# Patient Record
Sex: Female | Born: 1990 | Race: White | Hispanic: No | Marital: Married | Smoking: Never smoker
Health system: Southern US, Community
[De-identification: ages and names within clinical notes are randomized; demographics above are authoritative.]

## PROBLEM LIST (undated history)

## (undated) ENCOUNTER — Inpatient Hospital Stay (HOSPITAL_COMMUNITY): Payer: Self-pay

## (undated) DIAGNOSIS — F909 Attention-deficit hyperactivity disorder, unspecified type: Secondary | ICD-10-CM

## (undated) DIAGNOSIS — N83209 Unspecified ovarian cyst, unspecified side: Secondary | ICD-10-CM

## (undated) DIAGNOSIS — T8859XA Other complications of anesthesia, initial encounter: Secondary | ICD-10-CM

## (undated) DIAGNOSIS — R0600 Dyspnea, unspecified: Secondary | ICD-10-CM

## (undated) DIAGNOSIS — I493 Ventricular premature depolarization: Secondary | ICD-10-CM

## (undated) DIAGNOSIS — I10 Essential (primary) hypertension: Secondary | ICD-10-CM

## (undated) DIAGNOSIS — A749 Chlamydial infection, unspecified: Secondary | ICD-10-CM

## (undated) DIAGNOSIS — N879 Dysplasia of cervix uteri, unspecified: Secondary | ICD-10-CM

## (undated) DIAGNOSIS — F4310 Post-traumatic stress disorder, unspecified: Secondary | ICD-10-CM

## (undated) DIAGNOSIS — F429 Obsessive-compulsive disorder, unspecified: Secondary | ICD-10-CM

## (undated) DIAGNOSIS — G43909 Migraine, unspecified, not intractable, without status migrainosus: Secondary | ICD-10-CM

## (undated) DIAGNOSIS — O1490 Unspecified pre-eclampsia, unspecified trimester: Secondary | ICD-10-CM

## (undated) DIAGNOSIS — N390 Urinary tract infection, site not specified: Secondary | ICD-10-CM

## (undated) DIAGNOSIS — R000 Tachycardia, unspecified: Secondary | ICD-10-CM

## (undated) DIAGNOSIS — R87629 Unspecified abnormal cytological findings in specimens from vagina: Secondary | ICD-10-CM

## (undated) DIAGNOSIS — E162 Hypoglycemia, unspecified: Secondary | ICD-10-CM

## (undated) HISTORY — PX: WISDOM TOOTH EXTRACTION: SHX21

---
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
## 2014-02-26 ENCOUNTER — Ambulatory Visit (INDEPENDENT_AMBULATORY_CARE_PROVIDER_SITE_OTHER): Payer: BC Managed Care – PPO | Admitting: Family Medicine

## 2014-02-26 ENCOUNTER — Ambulatory Visit (INDEPENDENT_AMBULATORY_CARE_PROVIDER_SITE_OTHER): Payer: BC Managed Care – PPO

## 2014-02-26 VITALS — BP 122/78 | HR 94 | Temp 98.3°F | Resp 16 | Ht 62.3 in | Wt 149.8 lb

## 2014-02-26 DIAGNOSIS — R319 Hematuria, unspecified: Secondary | ICD-10-CM

## 2014-02-26 DIAGNOSIS — R109 Unspecified abdominal pain: Secondary | ICD-10-CM

## 2014-02-26 DIAGNOSIS — N926 Irregular menstruation, unspecified: Secondary | ICD-10-CM

## 2014-02-26 LAB — POCT UA - MICROSCOPIC ONLY
BACTERIA, U MICROSCOPIC: NEGATIVE
Casts, Ur, LPF, POC: NEGATIVE
Crystals, Ur, HPF, POC: NEGATIVE
Mucus, UA: NEGATIVE
Yeast, UA: NEGATIVE

## 2014-02-26 LAB — POCT URINALYSIS DIPSTICK
BILIRUBIN UA: NEGATIVE
GLUCOSE UA: NEGATIVE
Ketones, UA: NEGATIVE
LEUKOCYTES UA: NEGATIVE
NITRITE UA: NEGATIVE
Protein, UA: NEGATIVE
Spec Grav, UA: 1.02
Urobilinogen, UA: 0.2
pH, UA: 7

## 2014-02-26 LAB — POCT CBC
Granulocyte percent: 61.5 %G (ref 37–80)
HCT, POC: 38.9 % (ref 37.7–47.9)
Hemoglobin: 12.5 g/dL (ref 12.2–16.2)
Lymph, poc: 3.4 (ref 0.6–3.4)
MCH, POC: 28.5 pg (ref 27–31.2)
MCHC: 32.1 g/dL (ref 31.8–35.4)
MCV: 88.5 fL (ref 80–97)
MID (cbc): 0.4 (ref 0–0.9)
MPV: 10 fL (ref 0–99.8)
POC Granulocyte: 6.1 (ref 2–6.9)
POC LYMPH PERCENT: 34.2 %L (ref 10–50)
POC MID %: 4.3 %M (ref 0–12)
Platelet Count, POC: 295 10*3/uL (ref 142–424)
RBC: 4.39 M/uL (ref 4.04–5.48)
RDW, POC: 13.1 %
WBC: 10 10*3/uL (ref 4.6–10.2)

## 2014-02-26 LAB — POCT URINE PREGNANCY: Preg Test, Ur: NEGATIVE

## 2014-02-26 MED ORDER — CIPROFLOXACIN HCL 250 MG PO TABS: 250.0000 mg | ORAL_TABLET | Freq: Two times a day (BID) | ORAL | Status: DC

## 2014-02-26 NOTE — Progress Notes (Deleted)
   Subjective:    Patient ID: Rachelle HoraAmber Osterloh, female    DOB: [birth year], 23 y.o.   MRN: [id number]  HPI 23 yo female presents for medication refill.  She denies any change in symptoms recently.  Ran out of Prozac and Klonopin as well as Mobic.  She did not make it to follow up with Dr. Katrinka BlazingSmith prior to running out of medications.  States she was doing very well on the combination of Prozac and Klonipin.   No SI currently.  No sleep disturbance.  PPMH:  Depression  SH:  Nonsmoker, no alcohol    Review of Systems As per HPI, otherwise all systems negative.    Objective:   Physical Exam Blood pressure 122/78, pulse 94, temperature 98.3 F (36.8 C), temperature source Oral, resp. rate 16, height 5' 2.3" (1.582 m), weight 149 lb 12.8 oz (67.949 kg), SpO2 97.00%. Body mass index is 27.15 kg/(m^2). Well-developed, well nourished female who is awake, alert and oriented, in NAD. HEENT: [place]/AT, PERRL, EOMI.  Sclera and conjunctiva are clear. Neck: supple Heart: RRR, no murmur Lungs: normal effort Extremities: no cyanosis, clubbing or edema. Skin: warm and dry without rash. Psychologic: good mood and appropriate affect, normal speech and behavior, negative for SI and HI.      Assessment & Plan:  1. Medication refill Refilled her medications for one month and advised her that she needs to make and keep follow up appointments.  She state she will reschedule a time to follow up with Dr. Katrinka BlazingSmith for reassesment of her medication regimen.   2.  Depression

## 2014-02-26 NOTE — Progress Notes (Signed)
Subjective:    Patient ID: Susan Krause, female    DOB: 11/25/1990, 22 y.o.   MRN: [id number]  HPI 23 year old female presents for evaluation of acute onset of hematuria and left sided flank pain.  States symptoms started yesterday and have continued today. She noticed blood in the toilet and she is not on her menses.   Denies dysuria, urinary frequency, fever, chills, nausea, vomiting, or vaginal discharge. States that her main concern is the pain in her kidney. No hx of kidney stones.     Review of Systems  Constitutional: Negative for fever and chills.  Gastrointestinal: Negative for nausea, vomiting and abdominal pain.  Genitourinary: Positive for hematuria and flank pain. Negative for dysuria, frequency and vaginal discharge.       Objective:   Physical Exam  Constitutional: She is oriented to person, place, and time. She appears well-developed and well-nourished.  HENT:  Head: Normocephalic and atraumatic.  Right Ear: External ear normal.  Eyes: Conjunctivae are normal.  Neck: Normal range of motion.  Cardiovascular: Normal rate, regular rhythm and normal heart sounds.   Pulmonary/Chest: Effort normal and breath sounds normal.  Abdominal: Soft. Bowel sounds are normal. There is no tenderness. There is CVA tenderness (left sided).  Neurological: She is alert and oriented to person, place, and time.  Psychiatric: She has a normal mood and affect. Her behavior is normal. Judgment and thought content normal.    Results for orders placed in visit on 02/26/14  POCT URINALYSIS DIPSTICK      Result Value Ref Range   Color, UA yellow     Clarity, UA clear     Glucose, UA neg     Bilirubin, UA neg     Ketones, UA neg     Spec Grav, UA 1.020     Blood, UA trace     pH, UA 7.0     Protein, UA neg     Urobilinogen, UA 0.2     Nitrite, UA neg     Leukocytes, UA Negative    POCT UA - MICROSCOPIC ONLY      Result Value Ref Range   WBC, Ur, HPF, POC 0-1     RBC, urine,  microscopic 0-2     Bacteria, U Microscopic neg     Mucus, UA neg     Epithelial cells, urine per micros 0-2     Crystals, Ur, HPF, POC neg     Casts, Ur, LPF, POC neg     Yeast, UA neg    POCT CBC      Result Value Ref Range   WBC 10.0  4.6 - 10.2 K/uL   Lymph, poc 3.4  0.6 - 3.4   POC LYMPH PERCENT 34.2  10 - 50 %L   MID (cbc) 0.4  0 - 0.9   POC MID % 4.3  0 - 12 %M   POC Granulocyte 6.1  2 - 6.9   Granulocyte percent 61.5  37 - 80 %G   RBC 4.39  4.04 - 5.48 M/uL   Hemoglobin 12.5  12.2 - 16.2 g/dL   HCT, POC 08.638.9  57.837.7 - 47.9 %   MCV 88.5  80 - 97 fL   MCH, POC 28.5  27 - 31.2 pg   MCHC 32.1  31.8 - 35.4 g/dL   RDW, POC 46.913.1     Platelet Count, POC 295  142 - 424 K/uL   MPV 10.0  0 - 99.8 fL  POCT URINE PREGNANCY      Result Value Ref Range   Preg Test, Ur Negative       [facility] reading (PRIMARY) by  Dr. Patsy Lageropland as no acute abnormality.      Assessment & Plan:  Blood urine - Plan: POCT urinalysis dipstick, POCT UA - Microscopic Only, POCT CBC, Urine culture  Flank pain - Plan: POCT CBC, DG Abd 1 View, Urine culture  Irregular menstrual cycle - Plan: POCT urine pregnancy  Urine culture sent.  Will go ahead and treat with cipro 250 mg bid x 7 days due to sx's and CVA tenderness Cannot exclude kidney stone at this time - recommend she push fluids and monitor sx's.  RTC or go to ER if acutely worsening. Patient understands RTC precautions including increasing pain, nausea, vomiting, fever, or chills.

## 2014-02-28 LAB — URINE CULTURE
Colony Count: NO GROWTH
Organism ID, Bacteria: NO GROWTH

## 2015-08-22 ENCOUNTER — Encounter (HOSPITAL_BASED_OUTPATIENT_CLINIC_OR_DEPARTMENT_OTHER): Payer: Self-pay | Admitting: *Deleted

## 2015-08-22 ENCOUNTER — Emergency Department (HOSPITAL_BASED_OUTPATIENT_CLINIC_OR_DEPARTMENT_OTHER)
Admission: EM | Admit: 2015-08-22 | Discharge: 2015-08-23 | Disposition: A | Discharge status: Home / Self Care | Payer: BLUE CROSS/BLUE SHIELD | Attending: Emergency Medicine | Admitting: Emergency Medicine

## 2015-08-22 DIAGNOSIS — Z79899 Other long term (current) drug therapy: Secondary | ICD-10-CM | POA: Insufficient documentation

## 2015-08-22 DIAGNOSIS — G43819 Other migraine, intractable, without status migrainosus: Secondary | ICD-10-CM | POA: Insufficient documentation

## 2015-08-22 DIAGNOSIS — Z3202 Encounter for pregnancy test, result negative: Secondary | ICD-10-CM | POA: Insufficient documentation

## 2015-08-22 DIAGNOSIS — Z79818 Long term (current) use of other agents affecting estrogen receptors and estrogen levels: Secondary | ICD-10-CM | POA: Insufficient documentation

## 2015-08-22 DIAGNOSIS — G43909 Migraine, unspecified, not intractable, without status migrainosus: Secondary | ICD-10-CM | POA: Diagnosis present

## 2015-08-22 DIAGNOSIS — G43809 Other migraine, not intractable, without status migrainosus: Secondary | ICD-10-CM

## 2015-08-22 HISTORY — DX: Migraine, unspecified, not intractable, without status migrainosus: G43.909

## 2015-08-22 LAB — PREGNANCY, URINE: Preg Test, Ur: NEGATIVE

## 2015-08-22 MED ORDER — KETOROLAC TROMETHAMINE 60 MG/2ML IM SOLN
60.0000 mg | Freq: Once | INTRAMUSCULAR | Status: AC
  Administered 2015-08-23: 60 mg via INTRAMUSCULAR
  Filled 2015-08-22: qty 2

## 2015-08-22 NOTE — ED Notes (Signed)
Pt began having a Migraine HA this am denies N/V

## 2015-08-23 ENCOUNTER — Encounter (HOSPITAL_BASED_OUTPATIENT_CLINIC_OR_DEPARTMENT_OTHER): Payer: Self-pay | Admitting: Emergency Medicine

## 2015-08-23 NOTE — ED Notes (Signed)
MD at bedside. 

## 2015-08-23 NOTE — ED Provider Notes (Signed)
CSN: [id number]     Arrival date & time 08/22/15  2312 History   First MD Initiated Contact with Patient 08/23/15 0002     Chief Complaint  Patient presents with  . Migraine     (Consider location/radiation/quality/duration/timing/severity/associated sxs/prior Treatment) Patient is a 24 y.o. female presenting with migraines. The history is provided by the patient.  Migraine This is a recurrent problem. The current episode started 6 to 12 hours ago. The problem occurs constantly. The problem has not changed since onset.Associated symptoms include headaches. Pertinent negatives include no chest pain, no abdominal pain and no shortness of breath. Nothing aggravates the symptoms. Nothing relieves the symptoms. Treatments tried: propranolol and fioricet. The treatment provided no relief.  gets them 3-5 times a week recent normal MRI.  No f/c/r.  No neuro symptoms no travel no rashes no neck pain.  No atypia.  No changes in speech vision or cognition  Past Medical History  Diagnosis Date  . Migraine    History reviewed. No pertinent past surgical history. History reviewed. No pertinent family history. Social History  Substance Use Topics  . Smoking status: Never Smoker   . Smokeless tobacco: None  . Alcohol Use: No   OB History    No data available     Review of Systems  Constitutional: Negative for fever.  Respiratory: Negative for shortness of breath.   Cardiovascular: Negative for chest pain.  Gastrointestinal: Negative for abdominal pain.  Neurological: Positive for headaches. Negative for dizziness, seizures, syncope, facial asymmetry, speech difficulty, weakness and numbness.  All other systems reviewed and are negative.     Allergies  Contrast media and Gardasil  Home Medications   Prior to Admission medications   Medication Sig Start Date End Date Taking? Authorizing Provider  butalbital-acetaminophen-caffeine (FIORICET, ESGIC) 50-325-40 MG tablet Take by mouth every  6 (six) hours as needed for headache.   Yes Historical Provider, MD  propranolol (INDERAL) 10 MG tablet Take 10 mg by mouth daily.   Yes Historical Provider, MD  ciprofloxacin (CIPRO) 250 MG tablet Take 1 tablet (250 mg total) by mouth 2 (two) times daily. 02/26/14   Pearline Cables, MD  norethindrone-ethinyl estradiol 1/35 (ORTHO-NOVUM, NORTREL,CYCLAFEM) tablet Take 1 tablet by mouth daily.    Historical Provider, MD   BP 139/89 mmHg  Pulse 87  Temp(Src) 98.4 F (36.9 C) (Oral)  Wt 152 lb (68.947 kg)  SpO2 100%  LMP 05/23/2015 Physical Exam  Constitutional: She is oriented to person, place, and time. She appears well-developed and well-nourished.  HENT:  Head: Normocephalic and atraumatic.  Mouth/Throat: Oropharynx is clear and moist.  Eyes: Conjunctivae and EOM are normal. Pupils are equal, round, and reactive to light.  Neck: Normal range of motion. Neck supple.  Cardiovascular: Normal rate, regular rhythm and intact distal pulses.   Pulmonary/Chest: Effort normal and breath sounds normal. No respiratory distress. She has no wheezes. She has no rales.  Abdominal: Soft. Bowel sounds are normal. There is no tenderness. There is no rebound and no guarding.  Musculoskeletal: Normal range of motion.  Lymphadenopathy:    She has no cervical adenopathy.  Neurological: She is alert and oriented to person, place, and time. She has normal reflexes. She displays normal reflexes. No cranial nerve deficit. She exhibits normal muscle tone. Coordination normal.  Skin: Skin is warm and dry.  Psychiatric: She has a normal mood and affect.    ED Course  Procedures (including critical care time) Labs Review Labs Reviewed  PREGNANCY,  URINE    Imaging Review No results found. I have personally reviewed and evaluated these images and lab results as part of my medical decision-making.   EKG Interpretation None      MDM   Final diagnoses:  Other migraine without status migrainosus, not  intractable    Medications  ketorolac (TORADOL) injection 60 mg (60 mg Intramuscular Given 08/23/15 0004)    Feels better post toradol and wants to go home.  There is no atypia to this migraine.  No indication for imaging at this time.  D/c with strict return precautions    Zemira Zehring, MD 08/23/15 [id number]

## 2015-08-23 NOTE — Discharge Instructions (Signed)

## 2015-09-11 ENCOUNTER — Encounter (HOSPITAL_COMMUNITY): Payer: Self-pay | Admitting: Medical

## 2015-09-11 ENCOUNTER — Telehealth (HOSPITAL_COMMUNITY): Payer: Self-pay | Admitting: *Deleted

## 2015-09-11 ENCOUNTER — Other Ambulatory Visit (HOSPITAL_COMMUNITY): Payer: Self-pay | Admitting: Medical

## 2015-09-11 ENCOUNTER — Ambulatory Visit (INDEPENDENT_AMBULATORY_CARE_PROVIDER_SITE_OTHER): Payer: BLUE CROSS/BLUE SHIELD | Admitting: Medical

## 2015-09-11 VITALS — BP 120/70 | HR 84 | Ht 62.0 in | Wt 152.0 lb

## 2015-09-11 DIAGNOSIS — F4312 Post-traumatic stress disorder, chronic: Secondary | ICD-10-CM | POA: Diagnosis not present

## 2015-09-11 DIAGNOSIS — F909 Attention-deficit hyperactivity disorder, unspecified type: Secondary | ICD-10-CM

## 2015-09-11 DIAGNOSIS — R441 Visual hallucinations: Secondary | ICD-10-CM | POA: Diagnosis not present

## 2015-09-11 DIAGNOSIS — Z62810 Personal history of physical and sexual abuse in childhood: Secondary | ICD-10-CM

## 2015-09-11 DIAGNOSIS — F4310 Post-traumatic stress disorder, unspecified: Secondary | ICD-10-CM | POA: Insufficient documentation

## 2015-09-11 DIAGNOSIS — Z62890 Parent-child estrangement NEC: Secondary | ICD-10-CM

## 2015-09-11 DIAGNOSIS — F988 Other specified behavioral and emotional disorders with onset usually occurring in childhood and adolescence: Secondary | ICD-10-CM

## 2015-09-11 MED ORDER — LAMOTRIGINE 25 MG PO TABS: ORAL_TABLET | ORAL | Status: DC

## 2015-09-11 MED ORDER — TRAZODONE HCL 50 MG PO TABS: 50.0000 mg | ORAL_TABLET | Freq: Every evening | ORAL | Status: DC | PRN

## 2015-09-11 NOTE — Telephone Encounter (Signed)
[facility] for a 90 day supply of Trazodone 50mg . Per Belenda Cruiseharles Kober,PA, pt is authorized for a 90 day supply of Trazodone 50mg , #180. Pt is schedule for a f/u appt on 10/09/15.
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
## 2015-09-13 DIAGNOSIS — Z62810 Personal history of physical and sexual abuse in childhood: Secondary | ICD-10-CM

## 2015-09-13 DIAGNOSIS — R441 Visual hallucinations: Secondary | ICD-10-CM | POA: Insufficient documentation

## 2015-09-13 DIAGNOSIS — Z62890 Parent-child estrangement NEC: Secondary | ICD-10-CM | POA: Insufficient documentation

## 2015-09-13 HISTORY — DX: Personal history of physical and sexual abuse in childhood: Z62.810

## 2015-09-19 ENCOUNTER — Telehealth (HOSPITAL_COMMUNITY): Payer: Self-pay | Admitting: *Deleted

## 2015-09-19 NOTE — Telephone Encounter (Signed)
Pt would like to speak with Leonette Mostharles. Pt states Lamictal is causing her to spotting in between menstrual cycles. Pt states she is taking the medicine before she goes to bed, everything is going well until 5pm and she becomes irritable with everything. Pt would like to know if she need to stop taking the medication. Pt is schedule for a f/u appt on 10/09/15. Please call to advise @ [phone].

## 2015-09-22 NOTE — Telephone Encounter (Signed)
Lamictal doesnt cause spotting;Continue to titrate dose up per RX instructions

## 2015-09-29 NOTE — Telephone Encounter (Signed)
Return telephone call to pt. Informed pt per Maryjean Morn, Lamictal doesn't causing spotting, pt will need to continue prescription as written. Pt states she has stop taking Lamictal, but would like another medication. Informed pt, medication changes are complete at appointments. Pt is schedule for a f/u appt on 10/09/15.

## 2015-09-29 NOTE — Telephone Encounter (Signed)
thanks

## 2015-10-09 ENCOUNTER — Ambulatory Visit (HOSPITAL_COMMUNITY): Payer: BLUE CROSS/BLUE SHIELD | Admitting: Medical

## 2016-09-13 DIAGNOSIS — I493 Ventricular premature depolarization: Secondary | ICD-10-CM

## 2016-09-13 HISTORY — DX: Ventricular premature depolarization: I49.3

## 2017-06-22 ENCOUNTER — Other Ambulatory Visit: Payer: Self-pay | Admitting: Obstetrics & Gynecology

## 2017-08-12 ENCOUNTER — Ambulatory Visit (HOSPITAL_BASED_OUTPATIENT_CLINIC_OR_DEPARTMENT_OTHER): Admit: 2017-08-12 | Payer: BLUE CROSS/BLUE SHIELD | Admitting: Obstetrics & Gynecology

## 2017-08-12 ENCOUNTER — Encounter (HOSPITAL_BASED_OUTPATIENT_CLINIC_OR_DEPARTMENT_OTHER): Payer: Self-pay

## 2017-08-12 SURGERY — LEEP (LOOP ELECTROSURGICAL EXCISION PROCEDURE)
Anesthesia: Choice

## 2017-11-23 ENCOUNTER — Inpatient Hospital Stay (HOSPITAL_COMMUNITY): Payer: 59

## 2017-11-23 ENCOUNTER — Inpatient Hospital Stay (HOSPITAL_COMMUNITY)
Admission: AD | Admit: 2017-11-23 | Discharge: 2017-11-23 | Disposition: A | Discharge status: Home / Self Care | Payer: 59 | Source: Ambulatory Visit | Attending: Obstetrics and Gynecology | Admitting: Obstetrics and Gynecology

## 2017-11-23 ENCOUNTER — Encounter (HOSPITAL_COMMUNITY): Payer: Self-pay | Admitting: *Deleted

## 2017-11-23 DIAGNOSIS — O3680X0 Pregnancy with inconclusive fetal viability, not applicable or unspecified: Secondary | ICD-10-CM

## 2017-11-23 DIAGNOSIS — O26891 Other specified pregnancy related conditions, first trimester: Secondary | ICD-10-CM | POA: Diagnosis present

## 2017-11-23 DIAGNOSIS — O4691 Antepartum hemorrhage, unspecified, first trimester: Secondary | ICD-10-CM

## 2017-11-23 DIAGNOSIS — O99341 Other mental disorders complicating pregnancy, first trimester: Secondary | ICD-10-CM | POA: Insufficient documentation

## 2017-11-23 DIAGNOSIS — R1030 Lower abdominal pain, unspecified: Secondary | ICD-10-CM | POA: Diagnosis present

## 2017-11-23 DIAGNOSIS — O209 Hemorrhage in early pregnancy, unspecified: Secondary | ICD-10-CM | POA: Diagnosis present

## 2017-11-23 DIAGNOSIS — M545 Low back pain: Secondary | ICD-10-CM | POA: Diagnosis not present

## 2017-11-23 DIAGNOSIS — F4310 Post-traumatic stress disorder, unspecified: Secondary | ICD-10-CM | POA: Diagnosis not present

## 2017-11-23 DIAGNOSIS — Z3A01 Less than 8 weeks gestation of pregnancy: Secondary | ICD-10-CM | POA: Insufficient documentation

## 2017-11-23 DIAGNOSIS — O4690 Antepartum hemorrhage, unspecified, unspecified trimester: Secondary | ICD-10-CM

## 2017-11-23 HISTORY — DX: Dysplasia of cervix uteri, unspecified: N87.9

## 2017-11-23 HISTORY — DX: Post-traumatic stress disorder, unspecified: F43.10

## 2017-11-23 HISTORY — DX: Chlamydial infection, unspecified: A74.9

## 2017-11-23 LAB — COMPREHENSIVE METABOLIC PANEL
ALBUMIN: 4.2 g/dL (ref 3.5–5.0)
ALT: 16 U/L (ref 14–54)
AST: 19 U/L (ref 15–41)
Alkaline Phosphatase: 54 U/L (ref 38–126)
Anion gap: 8 (ref 5–15)
BUN: 8 mg/dL (ref 6–20)
CO2: 23 mmol/L (ref 22–32)
Calcium: 9.2 mg/dL (ref 8.9–10.3)
Chloride: 104 mmol/L (ref 101–111)
Creatinine, Ser: 0.6 mg/dL (ref 0.44–1.00)
GFR calc Af Amer: >60 mL/min (ref >60)
GFR calc non Af Amer: >60 mL/min (ref >60)
GLUCOSE: 87 mg/dL (ref 65–99)
POTASSIUM: 3.6 mmol/L (ref 3.5–5.1)
SODIUM: 135 mmol/L (ref 135–145)
Total Bilirubin: 0.8 mg/dL (ref 0.3–1.2)
Total Protein: 7.9 g/dL (ref 6.5–8.1)

## 2017-11-23 LAB — CBC WITH DIFFERENTIAL/PLATELET
BASOS ABS: 0 10*3/uL (ref 0.0–0.1)
BASOS PCT: 0 %
EOS PCT: 1 %
Eosinophils Absolute: 0.1 10*3/uL (ref 0.0–0.7)
HCT: 37.6 % (ref 36.0–46.0)
Hemoglobin: 13.4 g/dL (ref 12.0–15.0)
Lymphocytes Relative: 33 %
Lymphs Abs: 3.7 10*3/uL (ref 0.7–4.0)
MCH: 29.7 pg (ref 26.0–34.0)
MCHC: 35.6 g/dL (ref 30.0–36.0)
MCV: 83.4 fL (ref 78.0–100.0)
MONO ABS: 0.4 10*3/uL (ref 0.1–1.0)
Monocytes Relative: 4 %
Neutro Abs: 6.9 10*3/uL (ref 1.7–7.7)
Neutrophils Relative %: 62 %
PLATELETS: 278 10*3/uL (ref 150–400)
RBC: 4.51 MIL/uL (ref 3.87–5.11)
RDW: 12.4 % (ref 11.5–15.5)
WBC: 11.1 10*3/uL — AB (ref 4.0–10.5)

## 2017-11-23 LAB — URINALYSIS, ROUTINE W REFLEX MICROSCOPIC
BILIRUBIN URINE: NEGATIVE
Glucose, UA: NEGATIVE mg/dL
HGB URINE DIPSTICK: NEGATIVE
Ketones, ur: NEGATIVE mg/dL
NITRITE: NEGATIVE
PROTEIN: NEGATIVE mg/dL
Specific Gravity, Urine: 1.012 (ref 1.005–1.030)
pH: 7 (ref 5.0–8.0)

## 2017-11-23 LAB — POCT PREGNANCY, URINE: PREG TEST UR: POSITIVE — AB

## 2017-11-23 LAB — WET PREP, GENITAL
Sperm: NONE SEEN
Trich, Wet Prep: NONE SEEN
Yeast Wet Prep HPF POC: NONE SEEN

## 2017-11-23 LAB — HCG, QUANTITATIVE, PREGNANCY: hCG, Beta Chain, Quant, S: 933 m[IU]/mL — ABNORMAL HIGH (ref <5)

## 2017-11-23 NOTE — MAU Note (Signed)
Pt started spotting today, had back pain yesterday, has been having lower abd cramping the whole pregnancy.  Pos HPT on March 8.

## 2017-11-23 NOTE — Discharge Instructions (Signed)
Vaginal Bleeding During Pregnancy, First Trimester °A small amount of bleeding (spotting) from the vagina is common in early pregnancy. Sometimes the bleeding is normal and is not a problem, and sometimes it is a sign of something serious. Be sure to tell your doctor about any bleeding from your vagina right away. °Follow these instructions at home: °· Watch your condition for any changes. °· Follow your doctor's instructions about how active you can be. °· If you are on bed rest: °? You may need to stay in bed and only get up to use the bathroom. °? You may be allowed to do some activities. °? If you need help, make plans for someone to help you. °· Write down: °? The number of pads you use each day. °? How often you change pads. °? How soaked (saturated) your pads are. °· Do not use tampons. °· Do not douche. °· Do not have sex or orgasms until your doctor says it is okay. °· If you pass any tissue from your vagina, save the tissue so you can show it to your doctor. °· Only take medicines as told by your doctor. °· Do not take aspirin because it can make you bleed. °· Keep all follow-up visits as told by your doctor. °Contact a doctor if: °· You bleed from your vagina. °· You have cramps. °· You have labor pains. °· You have a fever that does not go away after you take medicine. °Get help right away if: °· You have very bad cramps in your back or belly (abdomen). °· You pass large clots or tissue from your vagina. °· You bleed more. °· You feel light-headed or weak. °· You pass out (faint). °· You have chills. °· You are leaking fluid or have a gush of fluid from your vagina. °· You pass out while pooping (having a bowel movement). °This information is not intended to replace advice given to you by your health care provider. Make sure you discuss any questions you have with your health care provider. °Document Released: 01/14/2014 Document Revised: 02/05/2016 Document Reviewed: 05/07/2013 °Elsevier Interactive  Patient Education © 2018 [employer] ° °

## 2017-11-23 NOTE — Progress Notes (Signed)
Pt's mother passed out in room. EMS called and [facility]

## 2017-11-23 NOTE — MAU Provider Note (Signed)
Chief Complaint: Spotting and Abdominal Pain   First Provider Initiated Contact with Patient 11/23/17 1946        SUBJECTIVE HPI: Susan Krause is a 27 y.o. G1P0 at 4649w1d by LMP who presents to maternity admissions reporting spotting and low back pain.  Also has some cramping in lower abdomen.  . She denies vaginal itching/burning, urinary symptoms, h/a, dizziness, n/v, or fever/chills.    Abdominal Pain  This is a new problem. The current episode started today. The onset quality is gradual. The problem occurs intermittently. The problem has been unchanged. The pain is located in the suprapubic region, LLQ and RLQ. The quality of the pain is cramping. The abdominal pain radiates to the back. Pertinent negatives include no constipation, diarrhea, dysuria or fever. Nothing aggravates the pain. The pain is relieved by nothing. She has tried nothing for the symptoms.   RN Note: Pt started spotting today, had back pain yesterday, has been having lower abd cramping the whole pregnancy.  Pos HPT on March 8.    Past Medical History:  Diagnosis Date  . Cervical dysplasia   . Chlamydia   . Migraine   . PTSD (post-traumatic stress disorder)    Past Surgical History:  Procedure Laterality Date  . WISDOM TOOTH EXTRACTION     Social History   Socioeconomic History  . Marital status: Single    Spouse name: Not on file  . Number of children: Not on file  . Years of education: Not on file  . Highest education level: Not on file  Social Needs  . Financial resource strain: Not on file  . Food insecurity - worry: Not on file  . Food insecurity - inability: Not on file  . Transportation needs - medical: Not on file  . Transportation needs - non-medical: Not on file  Occupational History  . Not on file  Tobacco Use  . Smoking status: Never Smoker  . Smokeless tobacco: Never Used  Substance and Sexual Activity  . Alcohol use: No  . Drug use: No  . Sexual activity: Yes  Other Topics Concern  .  Not on file  Social History Narrative  . Not on file   No current facility-administered medications on file prior to encounter.    Current Outpatient Medications on File Prior to Encounter  Medication Sig Dispense Refill  . butalbital-acetaminophen-caffeine (FIORICET, ESGIC) 50-325-40 MG tablet Take by mouth every 6 (six) hours as needed for headache.    . lamoTRIgine (LAMICTAL) 25 MG tablet tAKE 25 MG X 5 DAYS THEN 50 MG X 5 DAYS THEN 75 MG X 5DAYS THEN 100 MG DAILY 100 tablet 0  . propranolol (INDERAL) 10 MG tablet Take 10 mg by mouth daily.    . traZODone (DESYREL) 50 MG tablet Take 1 tablet (50 mg total) by mouth at bedtime as needed and may repeat dose one time if needed for sleep. 180 tablet 0   Allergies  Allergen Reactions  . Contrast Media [Iodinated Diagnostic Agents] Nausea And Vomiting  . Gardasil [Hpv Vaccine Recombinant (Yeast Derived)]   . Peanut Oil Swelling    Tongue swells/itching  . Pumpkin Flavor Swelling  . Hpv Bival (Type 16,18) Recomb Vaccine  [Human Papillomavirus (16,18) Recomb Vac] Hives  . Lactose Intolerance (Gi)     I have reviewed patient's Past Medical Hx, Surgical Hx, Family Hx, Social Hx, medications and allergies.   ROS:  Review of Systems  Constitutional: Negative for fever.  Gastrointestinal: Positive for abdominal pain. Negative  for constipation and diarrhea.  Genitourinary: Negative for dysuria.   Review of Systems  Other systems negative   Physical Exam  Physical Exam Patient Vitals for the past 24 hrs:  BP Temp Temp src Pulse Resp Height Weight  11/23/17 2148 125/84 - - - - - -  11/23/17 1854 117/77 97.7 F (36.5 C) Oral 82 16 - -  11/23/17 1841 - - - - - 5\' 2"  (1.575 m) 169 lb 1.3 oz (76.7 kg)   Constitutional: Well-developed, well-nourished female in no acute distress.  Cardiovascular: normal rate Respiratory: normal effort GI: Abd soft, non-tender. Pos BS x 4 MS: Extremities nontender, no edema, normal ROM Neurologic: Alert  and oriented x 4.  GU: Neg CVAT.  PELVIC EXAM: Cervix pink, visually closed, without lesion, scant white creamy discharge, vaginal walls and external genitalia normal Bimanual exam: Cervix 0/long/high, firm, anterior, neg CMT, uterus nontender, nonenlarged, adnexa without tenderness, enlargement, or mass  LAB RESULTS Results for orders placed or performed during the hospital encounter of 11/23/17 (from the past 24 hour(s))  Urinalysis, Routine w reflex microscopic     Status: Abnormal   Collection Time: 11/23/17  6:40 PM  Result Value Ref Range   Color, Urine YELLOW YELLOW   APPearance CLEAR CLEAR   Specific Gravity, Urine 1.012 1.005 - 1.030   pH 7.0 5.0 - 8.0   Glucose, UA NEGATIVE NEGATIVE mg/dL   Hgb urine dipstick NEGATIVE NEGATIVE   Bilirubin Urine NEGATIVE NEGATIVE   Ketones, ur NEGATIVE NEGATIVE mg/dL   Protein, ur NEGATIVE NEGATIVE mg/dL   Nitrite NEGATIVE NEGATIVE   Leukocytes, UA TRACE (A) NEGATIVE   RBC / HPF 0-5 0 - 5 RBC/hpf   WBC, UA 0-5 0 - 5 WBC/hpf   Bacteria, UA MANY (A) NONE SEEN   Squamous Epithelial / LPF 0-5 (A) NONE SEEN   Mucus PRESENT   Pregnancy, urine POC     Status: Abnormal   Collection Time: 11/23/17  6:49 PM  Result Value Ref Range   Preg Test, Ur POSITIVE (A) NEGATIVE  CBC with Differential/Platelet     Status: Abnormal   Collection Time: 11/23/17  7:43 PM  Result Value Ref Range   WBC 11.1 (H) 4.0 - 10.5 K/uL   RBC 4.51 3.87 - 5.11 MIL/uL   Hemoglobin 13.4 12.0 - 15.0 g/dL   HCT 16.1 09.6 - 04.5 %   MCV 83.4 78.0 - 100.0 fL   MCH 29.7 26.0 - 34.0 pg   MCHC 35.6 30.0 - 36.0 g/dL   RDW 40.9 81.1 - 91.4 %   Platelets 278 150 - 400 K/uL   Neutrophils Relative % 62 %   Neutro Abs 6.9 1.7 - 7.7 K/uL   Lymphocytes Relative 33 %   Lymphs Abs 3.7 0.7 - 4.0 K/uL   Monocytes Relative 4 %   Monocytes Absolute 0.4 0.1 - 1.0 K/uL   Eosinophils Relative 1 %   Eosinophils Absolute 0.1 0.0 - 0.7 K/uL   Basophils Relative 0 %   Basophils Absolute  0.0 0.0 - 0.1 K/uL  Comprehensive metabolic panel     Status: None   Collection Time: 11/23/17  7:43 PM  Result Value Ref Range   Sodium 135 135 - 145 mmol/L   Potassium 3.6 3.5 - 5.1 mmol/L   Chloride 104 101 - 111 mmol/L   CO2 23 22 - 32 mmol/L   Glucose, Bld 87 65 - 99 mg/dL   BUN 8 6 - 20 mg/dL   Creatinine,  Ser 0.60 0.44 - 1.00 mg/dL   Calcium 9.2 8.9 - 16.1 mg/dL   Total Protein 7.9 6.5 - 8.1 g/dL   Albumin 4.2 3.5 - 5.0 g/dL   AST 19 15 - 41 U/L   ALT 16 14 - 54 U/L   Alkaline Phosphatase 54 38 - 126 U/L   Total Bilirubin 0.8 0.3 - 1.2 mg/dL   GFR calc non Af Amer >60 >60 mL/min   GFR calc Af Amer >60 >60 mL/min   Anion gap 8 5 - 15  ABO/Rh     Status: None   Collection Time: 11/23/17  7:43 PM  Result Value Ref Range   ABO/RH(D)      O POS Performed at [facility], [address], [place], [place] [address]   hCG, quantitative, pregnancy     Status: Abnormal   Collection Time: 11/23/17  7:43 PM  Result Value Ref Range   hCG, Beta Chain, Quant, S 933 (H) <5 mIU/mL  Wet prep, genital     Status: Abnormal   Collection Time: 11/23/17  9:00 PM  Result Value Ref Range   Yeast Wet Prep HPF POC NONE SEEN NONE SEEN   Trich, Wet Prep NONE SEEN NONE SEEN   Clue Cells Wet Prep HPF POC PRESENT (A) NONE SEEN   WBC, Wet Prep HPF POC MODERATE (A) NONE SEEN   Sperm NONE SEEN     --/--/O POS Performed at [facility], [address], [place], [place] [address]  [phone])  IMAGING US Ob Less Than 14 Weeks With Ob Transvaginal  Result Date: 11/23/2017 CLINICAL DATA:  27 year old female with positive pregnancy test. Pelvic cramping. LMP: 10/19/2017 corresponding to an estimated gestational age of [redacted] weeks, 0 days. EXAM: OBSTETRIC <14 WK [place] AND TRANSVAGINAL OB US TECHNIQUE: Both transabdominal and transvaginal ultrasound examinations were performed for complete evaluation of the gestation as well as the maternal uterus, adnexal regions, and pelvic cul-de-sac. Transvaginal  technique was performed to assess early pregnancy. COMPARISON:  None. FINDINGS: The uterus is retroverted. A small cystic structure noted in the upper endometrium. No fetal pole or yolk sac identified within this cystic structure. This may represent an early gestational sac, or a blighted ovum. A pseudo gestational of an ectopic pregnancy is not entirely excluded. Correlation with clinical exam and follow-up with serial HCG levels and ultrasound recommended. If this cystic structure is a true gestational sac the estimated gestational age based on mean sac diameter of 3 mm is 4 weeks, 6 days. The ovaries are unremarkable. There is trace free fluid within the pelvis. IMPRESSION: Intrauterine cystic structure as described which if it represents a gestational sac in corresponds to an estimated gestational age of [redacted] weeks, 6 days. Clinical correlation and follow-up with HCG levels and repeat ultrasound in 7-11 days, or earlier if clinically indicated, recommended. Electronically Signed   By: Elgie Collard M.D.   On: 11/23/2017 20:50    MAU Management/MDM: Ordered usual first trimester r/o ectopic labs.   Pelvic exam and cultures done Will check baseline Ultrasound to rule out ectopic.  This bleeding/pain can represent a normal pregnancy with bleeding, spontaneous abortion or even an ectopic which can be life-threatening.  The process as listed above helps to determine which of these is present.  Reviewed that with HCG where it is we do not expect to see a fetus in uterus yet Lack of yolk sac or fetus may represent early pregnancy or pregnancy of unknown location Cannot rule that  out yet Need to repeat Quant HCG in 2 days  ASSESSMENT 1. Pregnancy of unknown anatomic location   2. Vaginal bleeding in pregnancy   3. [redacted] weeks gestation of pregnancy     PLAN Discharge home Plan to repeat HCG level in 48 hours in clinic Friday afternoon Will repeat  Ultrasound in about 7-10 days if HCG levels double  appropriately  Ectopic precautions  Follow-up Information    [facility] [place] [facility]ERN[facility] ADMISSIONS. Go in 2 day(s).   Contact information: [address] [id number] mc [place] [address] [phone]         Pt stable at time of discharge. Encouraged to return here or to other Urgent Care/ED if she develops worsening of symptoms, increase in pain, fever, or other concerning symptoms.   Patient's mother had syncopal episode in room, hit head on cabinet. EMS called and transported to [place]  Wynelle Bourgeois CNM, MSN Certified Nurse-[name] 11/24/2017  5:55 AM

## 2017-11-24 LAB — ABO/RH: ABO/RH(D): O POS

## 2017-11-24 LAB — GC/CHLAMYDIA PROBE AMP (~~LOC~~) NOT AT ARMC
Chlamydia: NEGATIVE
Neisseria Gonorrhea: NEGATIVE

## 2017-11-25 ENCOUNTER — Ambulatory Visit: Payer: Self-pay

## 2017-11-25 ENCOUNTER — Ambulatory Visit: Payer: 59 | Admitting: *Deleted

## 2017-11-25 DIAGNOSIS — O3680X0 Pregnancy with inconclusive fetal viability, not applicable or unspecified: Secondary | ICD-10-CM

## 2017-11-25 DIAGNOSIS — O209 Hemorrhage in early pregnancy, unspecified: Secondary | ICD-10-CM

## 2017-11-25 LAB — HCG, QUANTITATIVE, PREGNANCY: hCG, Beta Chain, Quant, S: 2469 m[IU]/mL — ABNORMAL HIGH (ref <5)

## 2017-11-25 LAB — CULTURE, OB URINE: Special Requests: NORMAL

## 2017-11-25 NOTE — Progress Notes (Addendum)
-  Appropriate rise in quant.   Chart reviewed for nurse visit. Agree with plan of care.   Marylene LandKooistra, Kathryn Lorraine, CNM 11/25/2017 5:12 PM

## 2017-11-25 NOTE — Progress Notes (Signed)
Pt here for Bhcg - stat. Pt states she continues to have mild intermittent cramping (no different than 2 days ago) and small amount of pinkish bleeding - mostly only after using the bathroom. Results reviewed with Luna KitchensKathryn Kooistra, CNM.  Pt informed of results and pt stated that she intends to receive [facility]. She has been in contact with the office and her physician advised her that they will perform office ultrasound based on today's results. Pt will call [facility] with today's results.  Pt advised to return to hospital if bleeding or abdominal cramping worsens.  She voiced understanding.

## 2017-12-09 DIAGNOSIS — G43109 Migraine with aura, not intractable, without status migrainosus: Secondary | ICD-10-CM | POA: Insufficient documentation

## 2017-12-09 HISTORY — DX: Migraine with aura, not intractable, without status migrainosus: G43.109

## 2017-12-23 LAB — OB RESULTS CONSOLE HEPATITIS B SURFACE ANTIGEN: HEP B S AG: NEGATIVE

## 2017-12-23 LAB — OB RESULTS CONSOLE GC/CHLAMYDIA: Gonorrhea: NEGATIVE

## 2017-12-23 LAB — OB RESULTS CONSOLE ABO/RH: RH Type: POSITIVE

## 2017-12-23 LAB — OB RESULTS CONSOLE RPR: RPR: NONREACTIVE

## 2017-12-23 LAB — OB RESULTS CONSOLE RUBELLA ANTIBODY, IGM: RUBELLA: IMMUNE

## 2017-12-23 LAB — OB RESULTS CONSOLE ANTIBODY SCREEN: ANTIBODY SCREEN: NEGATIVE

## 2017-12-23 LAB — OB RESULTS CONSOLE HIV ANTIBODY (ROUTINE TESTING): HIV: NONREACTIVE

## 2018-01-11 ENCOUNTER — Other Ambulatory Visit (HOSPITAL_COMMUNITY): Payer: Self-pay | Admitting: Obstetrics & Gynecology

## 2018-01-11 DIAGNOSIS — Z369 Encounter for antenatal screening, unspecified: Secondary | ICD-10-CM

## 2018-01-11 DIAGNOSIS — Z3A13 13 weeks gestation of pregnancy: Secondary | ICD-10-CM

## 2018-01-20 ENCOUNTER — Ambulatory Visit (HOSPITAL_COMMUNITY): Payer: 59

## 2018-01-20 ENCOUNTER — Encounter (HOSPITAL_COMMUNITY): Payer: Self-pay

## 2018-04-18 ENCOUNTER — Other Ambulatory Visit: Payer: Self-pay

## 2018-04-18 ENCOUNTER — Inpatient Hospital Stay (HOSPITAL_COMMUNITY)
Admission: AD | Admit: 2018-04-18 | Discharge: 2018-04-18 | Disposition: A | Discharge status: Home / Self Care | Payer: 59 | Source: Ambulatory Visit | Attending: Obstetrics and Gynecology | Admitting: Obstetrics and Gynecology

## 2018-04-18 ENCOUNTER — Encounter (HOSPITAL_COMMUNITY): Payer: Self-pay

## 2018-04-18 DIAGNOSIS — O9A212 Injury, poisoning and certain other consequences of external causes complicating pregnancy, second trimester: Secondary | ICD-10-CM | POA: Insufficient documentation

## 2018-04-18 DIAGNOSIS — Z3A25 25 weeks gestation of pregnancy: Secondary | ICD-10-CM | POA: Insufficient documentation

## 2018-04-18 DIAGNOSIS — O368120 Decreased fetal movements, second trimester, not applicable or unspecified: Secondary | ICD-10-CM | POA: Diagnosis not present

## 2018-04-18 DIAGNOSIS — W109XXA Fall (on) (from) unspecified stairs and steps, initial encounter: Secondary | ICD-10-CM | POA: Diagnosis not present

## 2018-04-18 DIAGNOSIS — Y939 Activity, unspecified: Secondary | ICD-10-CM | POA: Insufficient documentation

## 2018-04-18 DIAGNOSIS — E739 Lactose intolerance, unspecified: Secondary | ICD-10-CM | POA: Insufficient documentation

## 2018-04-18 DIAGNOSIS — T1490XA Injury, unspecified, initial encounter: Secondary | ICD-10-CM | POA: Insufficient documentation

## 2018-04-18 DIAGNOSIS — W108XXA Fall (on) (from) other stairs and steps, initial encounter: Secondary | ICD-10-CM

## 2018-04-18 NOTE — MAU Note (Signed)
Pt states that she fell at 1410 down one step and landed on her knees.  Pt states that she has been intermittent cramping since the fall.   Denies bleeding, or LOF.   Reports decreased fetal movement since the fall at 1410

## 2018-04-18 NOTE — MAU Note (Signed)
Chief Complaint:  Decreased Fetal Movement and Fall   None    HPI: Susan Krause is a 27 y.o. G1P0 at [redacted]w[redacted]d who presents to maternity admissions reporting Pt states that she fell at 1410 down one step and landed on her knees. Pt states that she has been intermittent cramping since the fall.  Denies bleeding, or LOF.  Reports decreased fetal movement since the fall at 1410  Pt did not hit abdomen.  Complains of headache.  Hx of migraines  .  Severity: 7/10 in pain scale headache Duration: today Modifying factors:has not tried any otc or relief measures Associated signs and symptoms: none  Denies contractions, leakage of fluid or vaginal bleeding. Good fetal movement.   Pregnancy Course:   Past Medical History:  Diagnosis Date  . Cervical dysplasia   . Chlamydia   . Migraine   . PTSD (post-traumatic stress disorder)    OB History  Gravida Para Term Preterm AB Living  1            SAB TAB Ectopic Multiple Live Births               # Outcome Date GA Lbr Len/2nd Weight Sex Delivery Anes PTL Lv  1 Current            Past Surgical History:  Procedure Laterality Date  . WISDOM TOOTH EXTRACTION     History reviewed. No pertinent family history. Social History   Tobacco Use  . Smoking status: Never Smoker  . Smokeless tobacco: Never Used  Substance Use Topics  . Alcohol use: No  . Drug use: No   Allergies  Allergen Reactions  . Contrast Media [Iodinated Diagnostic Agents] Nausea And Vomiting  . Gardasil [Hpv Vaccine Recombinant (Yeast Derived)]   . Peanut Oil Swelling    Tongue swells/itching  . Pumpkin Flavor Swelling  . Hpv Bival (Type 16,18) Recomb Vaccine  [Human Papillomavirus (16,18) Recomb Vac] Hives  . Lactose Intolerance (Gi)    Medications Prior to Admission  Medication Sig Dispense Refill Last Dose  . butalbital-acetaminophen-caffeine (FIORICET, ESGIC) 50-325-40 MG tablet Take by mouth every 6 (six) hours as needed for headache.   Taking  . lamoTRIgine  (LAMICTAL) 25 MG tablet tAKE 25 MG X 5 DAYS THEN 50 MG X 5 DAYS THEN 75 MG X 5DAYS THEN 100 MG DAILY 100 tablet 0   . propranolol (INDERAL) 10 MG tablet Take 10 mg by mouth daily.   Taking  . traZODone (DESYREL) 50 MG tablet Take 1 tablet (50 mg total) by mouth at bedtime as needed and may repeat dose one time if needed for sleep. 180 tablet 0     I have reviewed patient's Past Medical Hx, Surgical Hx, Family Hx, Social Hx, medications and allergies.   ROS:  Review of Systems  Constitutional: Negative.   HENT: Negative.   Eyes: Negative.   Respiratory: Negative.   Cardiovascular: Negative.   Gastrointestinal: Positive for abdominal pain.  Endocrine: Negative.   Genitourinary: Negative.   Musculoskeletal: Negative.   Allergic/Immunologic: Negative.   Neurological: Positive for headaches.  Hematological: Negative.   Psychiatric/Behavioral: Negative.     Physical Exam   Patient Vitals for the past 24 hrs:  BP Temp Temp src Pulse Resp SpO2 Weight  04/18/18 1805 133/83 98.8 F (37.1 C) Oral (!) 106 20 98 % 94.4 kg (208 lb 3 oz)   Constitutional: Well-developed, well-nourished female in no acute distress.  Cardiovascular: normal rate Respiratory: normal effort GI: Abd soft,  non-tender, gravid appropriate for gestational age. Pos BS x 4 MS: Extremities nontender, no edema, normal ROM Neurologic: Alert and oriented x 4.   FHT:  Baseline150 , moderate variability, accelerations present, no decelerations Contractions: none   Labs: No results found for this or any previous visit (from the past 24 hour(s)).  Imaging:  No results found.  MAU Course: Orders Placed This Encounter  Procedures  . Discharge patient Discharge disposition: 01-Home or Self Care; Discharge patient date: 04/18/2018   No orders of the defined types were placed in this encounter.   MDM: PE and nst Assessment: 1. Fall (on) (from) other stairs and steps, initial encounter   2. [redacted] weeks gestation of  pregnancy     Plan: Discharge home in stable condition.  Preterm Labor precautions and fetal kick counts Follow-up Information    [facility] Follow up in 2 week(s).   Specialty:  Obstetrics and Gynecology Contact information: [address] [place] [phone]          Allergies as of 04/18/2018      Reactions   Contrast Media [iodinated Diagnostic Agents] Nausea And Vomiting   Gardasil [hpv Vaccine Recombinant (yeast Derived)]    Peanut Oil Swelling   Tongue swells/itching   Pumpkin Flavor Swelling   Hpv Bival (type 16,18) Recomb Vaccine  [human Papillomavirus (16,18) Recomb Vac] Hives   Lactose Intolerance (gi)       Medication List    STOP taking these medications   lamoTRIgine 25 MG tablet Commonly known as:  LAMICTAL   traZODone 50 MG tablet Commonly known as:  DESYREL     TAKE these medications   butalbital-acetaminophen-caffeine 50-325-40 MG tablet Commonly known as:  FIORICET, ESGIC Take by mouth every 6 (six) hours as needed for headache.   propranolol 10 MG tablet Commonly known as:  INDERAL Take 10 mg by mouth daily.       Kenney Housemanrothero, Nancy Jean, CNM 04/18/2018 6:45 PM

## 2018-04-22 ENCOUNTER — Other Ambulatory Visit: Payer: Self-pay

## 2018-04-22 ENCOUNTER — Encounter (HOSPITAL_COMMUNITY): Payer: Self-pay

## 2018-04-22 ENCOUNTER — Inpatient Hospital Stay (HOSPITAL_COMMUNITY)
Admission: AD | Admit: 2018-04-22 | Discharge: 2018-04-22 | Disposition: A | Discharge status: Home / Self Care | Payer: 59 | Source: Ambulatory Visit | Attending: Obstetrics and Gynecology | Admitting: Obstetrics and Gynecology

## 2018-04-22 DIAGNOSIS — Z887 Allergy status to serum and vaccine status: Secondary | ICD-10-CM | POA: Insufficient documentation

## 2018-04-22 DIAGNOSIS — O26892 Other specified pregnancy related conditions, second trimester: Secondary | ICD-10-CM | POA: Diagnosis not present

## 2018-04-22 DIAGNOSIS — Z91041 Radiographic dye allergy status: Secondary | ICD-10-CM | POA: Insufficient documentation

## 2018-04-22 DIAGNOSIS — Z91010 Allergy to peanuts: Secondary | ICD-10-CM | POA: Diagnosis not present

## 2018-04-22 DIAGNOSIS — Z818 Family history of other mental and behavioral disorders: Secondary | ICD-10-CM | POA: Diagnosis not present

## 2018-04-22 DIAGNOSIS — N898 Other specified noninflammatory disorders of vagina: Secondary | ICD-10-CM | POA: Diagnosis present

## 2018-04-22 DIAGNOSIS — Z825 Family history of asthma and other chronic lower respiratory diseases: Secondary | ICD-10-CM | POA: Insufficient documentation

## 2018-04-22 DIAGNOSIS — Z3A26 26 weeks gestation of pregnancy: Secondary | ICD-10-CM | POA: Insufficient documentation

## 2018-04-22 LAB — URINALYSIS, ROUTINE W REFLEX MICROSCOPIC
Bilirubin Urine: NEGATIVE
GLUCOSE, UA: NEGATIVE mg/dL
HGB URINE DIPSTICK: NEGATIVE
Ketones, ur: NEGATIVE mg/dL
Nitrite: NEGATIVE
PH: 6 (ref 5.0–8.0)
PROTEIN: NEGATIVE mg/dL
Specific Gravity, Urine: 1.012 (ref 1.005–1.030)

## 2018-04-22 LAB — AMNISURE RUPTURE OF MEMBRANE (ROM) NOT AT ARMC: AMNISURE: NEGATIVE

## 2018-04-22 NOTE — Discharge Instructions (Signed)
Premature Rupture and Preterm Premature Rupture of Membranes A sac made up of membranes surrounds your baby in the womb (uterus). Rupture of membranes is when this sac breaks open. This is also known as your "water breaking." When this sac breaks before labor starts, it is called premature rupture of membranes (PROM). If this happens before 37 weeks of being pregnant, it is called preterm premature rupture of membranes (PPROM). PPROM is serious. It needs medical care right away. What increases the risk of PPROM? PPROM is more likely to happen in women who:  Have an infection.  Have had PPROM before.  Have a cervix that is short.  Have bleeding during the second or third trimester.  Have a low BMI. This is a measure of body fat.  Smoke.  Use drugs.  Have a low socioeconomic status.  What problems can be caused by PROM and PPROM? This condition creates health dangers for the mother and the baby. These include:  Giving birth to the baby too early (prematurely).  Getting a serious infection of the placenta (chorioamnionitis).  Having the placenta detach from the uterus early (placental abruption).  Squeezing of the umbilical cord.  Getting a serious infection after delivery.  What are the signs of PROM and PPROM?  A sudden gush of fluid from the vagina.  A slow leak of fluid from the vagina.  Your underwear is wet. What should I do if I think my water broke? Call your doctor right away. You will need to go to the hospital to get checked right away. What happens if I am told that I have PROM or PPROM? You will have tests done at the hospital.  If you have PROM, you may be given medicine to start labor (be induced). This may be done if you are not having contractions during the 24 hours after your water broke.  If you have PPROM and are not having contractions, you may be given medicine to start labor. It will depend on how far along you are in your pregnancy.  If you have  PPROM:  You and your baby will be watched closely to see if you have infections or other problems.  You may be given: ? An antibiotic medicine. This can stop an infection from starting. ? A steroid medicine. This can help your baby's lungs develop faster. ? A medicine to help prevent cerebral palsy in your baby. ? A medicine to stop early labor (preterm labor).  You may be told to stay in bed except to use the bathroom (bed rest).  You may be given medicine to start labor. This may be done if there are problems with you or the baby.  Your treatment will depend on many factors. Contact a doctor if:  Your water breaks and you are not having contractions. Get help right away if:  Your water breaks before you are [redacted] weeks pregnant. Summary  When your water breaks before labor starts, it is called premature rupture of membranes (PROM).  When PROM happens before 37 weeks of pregnancy, it is called preterm premature rupture of membranes (PPROM).  If you are not having contractions, your labor may be started for you. This information is not intended to replace advice given to you by your health care provider. Make sure you discuss any questions you have with your health care provider. Document Released: 11/26/2008 Document Revised: 05/20/2016 Document Reviewed: 05/20/2016 Elsevier Interactive Patient Education  2017 Elsevier Inc.   Fetal Movement Counts Patient Name:  ________________________________________________ Patient Due Date: ____________________ What is a fetal movement count? A fetal movement count is the number of times that you feel your baby move during a certain amount of time. This may also be called a fetal kick count. A fetal movement count is recommended for every pregnant woman. You may be asked to start counting fetal movements as early as week 28 of your pregnancy. Pay attention to when your baby is most active. You may notice your baby's sleep and wake cycles. You  may also notice things that make your baby move more. You should do a fetal movement count:  When your baby is normally most active.  At the same time each day.  A good time to count movements is while you are resting, after having something to eat and drink. How do I count fetal movements? 1. Find a quiet, comfortable area. Sit, or lie down on your side. 2. Write down the date, the start time and stop time, and the number of movements that you felt between those two times. Take this information with you to your health care visits. 3. For 2 hours, count kicks, flutters, swishes, rolls, and jabs. You should feel at least 10 movements during 2 hours. 4. You may stop counting after you have felt 10 movements. 5. If you do not feel 10 movements in 2 hours, have something to eat and drink. Then, keep resting and counting for 1 hour. If you feel at least 4 movements during that hour, you may stop counting. Contact a health care provider if:  You feel fewer than 4 movements in 2 hours.  Your baby is not moving like he or she usually does. Date: ____________ Start time: ____________ Stop time: ____________ Movements: ____________ Date: ____________ Start time: ____________ Stop time: ____________ Movements: ____________ Date: ____________ Start time: ____________ Stop time: ____________ Movements: ____________ Date: ____________ Start time: ____________ Stop time: ____________ Movements: ____________ Date: ____________ Start time: ____________ Stop time: ____________ Movements: ____________ Date: ____________ Start time: ____________ Stop time: ____________ Movements: ____________ Date: ____________ Start time: ____________ Stop time: ____________ Movements: ____________ Date: ____________ Start time: ____________ Stop time: ____________ Movements: ____________ Date: ____________ Start time: ____________ Stop time: ____________ Movements: ____________ This information is not intended to replace  advice given to you by your health care provider. Make sure you discuss any questions you have with your health care provider. Document Released: 09/29/2006 Document Revised: 04/28/2016 Document Reviewed: 10/09/2015 Elsevier Interactive Patient Education  [employer]2018 Else[employer] Inc.

## 2018-04-22 NOTE — MAU Provider Note (Signed)
History     CSN: [id number]  Arrival date and time: 04/22/18 2107   None     Chief Complaint  Patient presents with  . Rupture of Membranes   Patient reported two gushes of fluid she noted at work 2 hours apart. She verbalized she did not feel the urge to urinate and does not think it was urine. The fluid was yellowish in color with some mucous, it saturated her underwear both occasions. She has had no more leaking of fluid since then. She denies vaginal bleeding and notes good fetal movement. Patient had a ground level fall 4 days ago and landed on her hands and knees, there was no abdominal trauma.    OB History    Gravida  1   Para      Term      Preterm      AB      Living        SAB      TAB      Ectopic      Multiple      Live Births              Past Medical History:  Diagnosis Date  . Cervical dysplasia   . Chlamydia   . Migraine    Propanolol 10 mg  . PTSD (post-traumatic stress disorder)    no meds    Past Surgical History:  Procedure Laterality Date  . WISDOM TOOTH EXTRACTION      Family History  Problem Relation Age of Onset  . ADD / ADHD Mother   . Anxiety disorder Mother   . Depression Mother   . Hyperlipidemia Mother   . Obesity Mother   . ADD / ADHD Father   . Anxiety disorder Sister   . Asthma Sister   . Depression Sister   . Early death Sister   . Intellectual disability Brother   . Learning disabilities Brother   . ADD / ADHD Sister   . Anxiety disorder Maternal Aunt   . Diabetes Maternal Aunt   . Alcohol abuse Maternal Uncle   . Hyperlipidemia Maternal Uncle   . Anxiety disorder Paternal Uncle   . Diabetes Paternal Uncle   . Anxiety disorder Maternal Grandmother   . Diabetes Maternal Grandmother   . Hyperlipidemia Maternal Grandmother   . Varicose Veins Maternal Grandmother   . Cancer Maternal Grandfather   . Hyperlipidemia Maternal Grandfather   . Obesity Maternal Grandfather   . Vision loss Maternal  Grandfather   . Cancer Paternal Grandmother     Social History   Tobacco Use  . Smoking status: Never Smoker  . Smokeless tobacco: Never Used  Substance Use Topics  . Alcohol use: No  . Drug use: No    Allergies:  Allergies  Allergen Reactions  . Contrast Media [Iodinated Diagnostic Agents] Nausea And Vomiting  . Gardasil [Hpv Vaccine Recombinant (Yeast Derived)]   . Peanut Oil Swelling    Tongue swells/itching  . Pumpkin Flavor Swelling  . Hpv Bival (Type 16,18) Recomb Vaccine  [Human Papillomavirus (16,18) Recomb Vac] Hives  . Lactose Intolerance (Gi)     Medications Prior to Admission  Medication Sig Dispense Refill Last Dose  . butalbital-acetaminophen-caffeine (FIORICET, ESGIC) 50-325-40 MG tablet Take by mouth every 6 (six) hours as needed for headache.   Taking  . propranolol (INDERAL) 10 MG tablet Take 10 mg by mouth daily.   Taking    Review of Systems  Gastrointestinal: Negative for  abdominal pain.  Genitourinary: Positive for vaginal discharge. Negative for dysuria, urgency and vaginal bleeding.  All other systems reviewed and are negative.  Physical Exam   Vitals:   04/22/18 2120 04/22/18 2140 04/22/18 2145  BP:   120/74  Pulse:   100  SpO2:  98% 99%  Weight: 95.3 kg    Height: 5\' 2"  (1.575 m)     Results for orders placed or performed during the hospital encounter of 04/22/18 (from the past 24 hour(s))  Amnisure rupture of membrane (rom)not at [facility]     Status: None   Collection Time: 04/22/18 10:13 PM  Result Value Ref Range   Amnisure ROM NEGATIVE   Urinalysis, Routine w reflex microscopic     Status: Abnormal   Collection Time: 04/22/18 10:18 PM  Result Value Ref Range   Color, Urine STRAW (A) YELLOW   APPearance CLEAR CLEAR   Specific Gravity, Urine 1.012 1.005 - 1.030   pH 6.0 5.0 - 8.0   Glucose, UA NEGATIVE NEGATIVE mg/dL   Hgb urine dipstick NEGATIVE NEGATIVE   Bilirubin Urine NEGATIVE NEGATIVE   Ketones, ur NEGATIVE NEGATIVE mg/dL    Protein, ur NEGATIVE NEGATIVE mg/dL   Nitrite NEGATIVE NEGATIVE   Leukocytes, UA MODERATE (A) NEGATIVE   RBC / HPF 0-5 0 - 5 RBC/hpf   WBC, UA 6-10 0 - 5 WBC/hpf   Bacteria, UA MANY (A) NONE SEEN   Squamous Epithelial / LPF 0-5 0 - 5    Physical Exam  Nursing note and vitals reviewed. Constitutional: She is oriented to person, place, and time. She appears well-developed and well-nourished.  HENT:  Head: Normocephalic.  Eyes: Pupils are equal, round, and reactive to light.  Cardiovascular: Normal rate, regular rhythm and normal heart sounds.  Respiratory: Effort normal and breath sounds normal.  GI: Soft. There is no tenderness.  Genitourinary: Vagina normal and uterus normal. No vaginal discharge found.  Musculoskeletal: Normal range of motion.  Neurological: She is alert and oriented to person, place, and time.  Skin: Skin is warm and dry.  Psychiatric: She has a normal mood and affect. Her behavior is normal. Judgment and thought content normal.   Fetal heart tracing: Reactive NST with occasional mild variables that are not concerning at this gestational age. At 22:16 there is a loss of contact as patient was up ambulating to the restroom, this is not a deceleration.   MAU Course  Procedures  MDM Patient had two incidents of loss of fluid, fluid did not continue leaking and on exam patient was dry. Amnisure is negative. Patient reports yellowish fluid with some mucous that could be mixed urine and vaginal discharge. Urinalysis shows no signs of UTI. Patient has had no more periods of leaking since leaving work. Patient is not ruptured and baby is stable with reactive NST. Patient discharged home with preterm labor precautions.   Assessment and Plan  27 y.o. G1P0 at 2739w3d Reactive NST Negative amnisure Discharged home with preterm labor precautions   Susan Krause 04/22/2018, 10:31 PM

## 2018-04-22 NOTE — MAU Note (Addendum)
G1 @ 26.[redacted] wksga. Presents to triage for possible LOF while at work twice 2 hrs apart. States mucus yellow color.   States fell earlier during the week. Came to MAU and was evaluated.  Denies bleeding. +FM   EFM applied.   2153: provider notified. Report status of pt given. Ordered amnisure.   2215: Relinquished care over to RN Alexx

## 2018-06-04 ENCOUNTER — Inpatient Hospital Stay (HOSPITAL_COMMUNITY)
Admission: AD | Admit: 2018-06-04 | Discharge: 2018-06-04 | Disposition: A | Discharge status: Home / Self Care | Payer: 59 | Source: Ambulatory Visit | Attending: Obstetrics & Gynecology | Admitting: Obstetrics & Gynecology

## 2018-06-04 ENCOUNTER — Inpatient Hospital Stay (HOSPITAL_BASED_OUTPATIENT_CLINIC_OR_DEPARTMENT_OTHER): Payer: 59

## 2018-06-04 ENCOUNTER — Encounter (HOSPITAL_COMMUNITY): Payer: Self-pay | Admitting: *Deleted

## 2018-06-04 DIAGNOSIS — Z3A32 32 weeks gestation of pregnancy: Secondary | ICD-10-CM | POA: Diagnosis not present

## 2018-06-04 DIAGNOSIS — O479 False labor, unspecified: Secondary | ICD-10-CM

## 2018-06-04 DIAGNOSIS — N939 Abnormal uterine and vaginal bleeding, unspecified: Secondary | ICD-10-CM

## 2018-06-04 DIAGNOSIS — O4693 Antepartum hemorrhage, unspecified, third trimester: Secondary | ICD-10-CM

## 2018-06-04 DIAGNOSIS — O4703 False labor before 37 completed weeks of gestation, third trimester: Secondary | ICD-10-CM | POA: Insufficient documentation

## 2018-06-04 HISTORY — DX: Attention-deficit hyperactivity disorder, unspecified type: F90.9

## 2018-06-04 LAB — URINALYSIS, ROUTINE W REFLEX MICROSCOPIC
BILIRUBIN URINE: NEGATIVE
Glucose, UA: NEGATIVE mg/dL
HGB URINE DIPSTICK: NEGATIVE
Ketones, ur: NEGATIVE mg/dL
NITRITE: NEGATIVE
PH: 7 (ref 5.0–8.0)
Protein, ur: NEGATIVE mg/dL
SPECIFIC GRAVITY, URINE: 1.005 (ref 1.005–1.030)

## 2018-06-04 LAB — WET PREP, GENITAL
Clue Cells Wet Prep HPF POC: NONE SEEN
SPERM: NONE SEEN
TRICH WET PREP: NONE SEEN
YEAST WET PREP: NONE SEEN

## 2018-06-04 LAB — FETAL FIBRONECTIN: FETAL FIBRONECTIN: NEGATIVE

## 2018-06-04 NOTE — MAU Note (Signed)
Susan Krause is a 27 y.o. at 1616w4d here in MAU reporting: lower abdominal intermittent cramping and vaginal bleeding. Bleeding went from brown to light pink in color. Noted in toilet and on tissue. Onset of complaint: started at 830 pm last night Pain score: 5/10 Vitals:   06/04/18 1154  BP: (!) 145/83  Pulse: (!) 103  Resp: 19  Temp: 97.6 F (36.4 C)  SpO2: 98%   Lab orders placed from triage: ua

## 2018-06-04 NOTE — Progress Notes (Signed)
Left message for CNM to call RN so report can be given

## 2018-06-04 NOTE — MAU Provider Note (Signed)
Chief Complaint:  Vaginal Bleeding and Abdominal Pain   None    HPI: Susan Krause is a 27 y.o. G1P0 at [redacted]w[redacted]d who presents to maternity admissions reporting feeling cxt, from time to time but has been having them, pt denies them becoming worse or increases, pt also stated she has had brownish vaginal discharge that started last night at 8pm, and has happened for a couple times, pt denies sti, no dysuria, no n, v, d, rashes, fever, or hematuria. Pt denies trauma, last sexual intercourse was one week ago. Denies leakage of fluid. Good fetal movement. Pt and family appear anxious and pt endorses having h/o anxiety. Pt endorses drinking 100 ounces of water daily.   Pregnancy Course:   Past Medical History:  Diagnosis Date  . ADHD (attention deficit hyperactivity disorder)    No meds  . Cervical dysplasia   . Chlamydia   . Migraine    Propanolol 10 mg  . PTSD (post-traumatic stress disorder)    no meds   OB History  Gravida Para Term Preterm AB Living  1            SAB TAB Ectopic Multiple Live Births               # Outcome Date GA Lbr Len/2nd Weight Sex Delivery Anes PTL Lv  1 Current            Past Surgical History:  Procedure Laterality Date  . WISDOM TOOTH EXTRACTION     Family History  Problem Relation Age of Onset  . ADD / ADHD Mother   . Anxiety disorder Mother   . Depression Mother   . Hyperlipidemia Mother   . Obesity Mother   . ADD / ADHD Father   . Anxiety disorder Sister   . Asthma Sister   . Depression Sister   . Intellectual disability Brother   . Learning disabilities Brother   . ADD / ADHD Sister   . Anxiety disorder Maternal Aunt   . Diabetes Maternal Aunt   . Alcohol abuse Maternal Uncle   . Hyperlipidemia Maternal Uncle   . Anxiety disorder Paternal Uncle   . Diabetes Paternal Uncle   . Anxiety disorder Maternal Grandmother   . Diabetes Maternal Grandmother   . Hyperlipidemia Maternal Grandmother   . Varicose Veins Maternal Grandmother   . Cancer  Maternal Grandfather   . Hyperlipidemia Maternal Grandfather   . Obesity Maternal Grandfather   . Vision loss Maternal Grandfather   . Cancer Paternal Grandmother    Social History   Tobacco Use  . Smoking status: Never Smoker  . Smokeless tobacco: Never Used  Substance Use Topics  . Alcohol use: No  . Drug use: No   Allergies  Allergen Reactions  . Contrast Media [Iodinated Diagnostic Agents] Nausea And Vomiting  . Gardasil [Hpv Vaccine Recombinant (Yeast Derived)]   . Peanut Oil Swelling    Tongue swells/itching  . Pumpkin Flavor Swelling  . Hpv Bival (Type 16,18) Recomb Vaccine  [Human Papillomavirus (16,18) Recomb Vac] Hives  . Lactose Intolerance (Gi)    No medications prior to admission.    I have reviewed patient's Past Medical Hx, Surgical Hx, Family Hx, Social Hx, medications and allergies.   ROS:  Review of Systems  All other systems reviewed and are negative.   Physical Exam   Patient Vitals for the past 24 hrs:  BP Temp Temp src Pulse Resp SpO2 Weight  06/04/18 1700 116/71 98.5 F (36.9 C) Oral  96 18 99 % -  06/04/18 1300 113/73 - - (!) 108 - - -  06/04/18 1245 112/70 - - (!) 109 - - -  06/04/18 1230 122/80 - - (!) 109 - - -  06/04/18 1215 122/78 - - (!) 108 - - -  06/04/18 1213 125/80 - - (!) 112 - - -  06/04/18 1154 (!) 145/83 97.6 F (36.4 C) Oral (!) 103 19 98 % 100.3 kg   Constitutional: Well-developed, well-nourished female in no acute distress.  Cardiovascular: normal rate Respiratory: normal effort GI: Abd soft, non-tender, gravid appropriate for gestational age. Pos BS x 4 MS: Extremities nontender, no edema, normal ROM Neurologic: Alert and oriented x 4.  GU: Neg CVAT.  Pelvic: NEFG, physiologic discharge, lots with yellowish tint. , no blood, cervix clean. Cervix friable. Pelvic adequate for labor. No CMT  Dilation: Closed Effacement (%): Thick Station: -3 Exam by:: J. Jahkeem Kurka, CNM  2 hours post first check no cervical changed  noted.   NST: FHR baseline 135 bpm, Variability: moderate, Accelerations:present, Decelerations:  Absent= Cat 1/Reactive UC:   3 over last 2.5 hours. None before discharge SVE:   Dilation: Closed Effacement (%): Thick Station: -3 Exam by:: J. Matie Dimaano, CNM,   Labs: Results for orders placed or performed during the hospital encounter of 06/04/18 (from the past 24 hour(s))  Urinalysis, Routine w reflex microscopic     Status: Abnormal   Collection Time: 06/04/18 12:02 PM  Result Value Ref Range   Color, Urine YELLOW YELLOW   APPearance HAZY (A) CLEAR   Specific Gravity, Urine 1.005 1.005 - 1.030   pH 7.0 5.0 - 8.0   Glucose, UA NEGATIVE NEGATIVE mg/dL   Hgb urine dipstick NEGATIVE NEGATIVE   Bilirubin Urine NEGATIVE NEGATIVE   Ketones, ur NEGATIVE NEGATIVE mg/dL   Protein, ur NEGATIVE NEGATIVE mg/dL   Nitrite NEGATIVE NEGATIVE   Leukocytes, UA MODERATE (A) NEGATIVE   RBC / HPF 0-5 0 - 5 RBC/hpf   WBC, UA 0-5 0 - 5 WBC/hpf   Bacteria, UA MANY (A) NONE SEEN   Squamous Epithelial / LPF 0-5 0 - 5  Wet prep, genital     Status: Abnormal   Collection Time: 06/04/18  3:17 PM  Result Value Ref Range   Yeast Wet Prep HPF POC NONE SEEN NONE SEEN   Trich, Wet Prep NONE SEEN NONE SEEN   Clue Cells Wet Prep HPF POC NONE SEEN NONE SEEN   WBC, Wet Prep HPF POC MANY (A) NONE SEEN   Sperm NONE SEEN   Fetal fibronectin     Status: None   Collection Time: 06/04/18  3:17 PM  Result Value Ref Range   Fetal Fibronectin NEGATIVE NEGATIVE    Imaging:  No results found.  MAU Course: Orders Placed This Encounter  Procedures  . Wet prep, genital  . Culture, Urine  . [place] MFM OB Limited  . Urinalysis, Routine w reflex microscopic  . Fetal fibronectin  . Diet general  . Call MD for:  . Call MD for:  temperature >100.4  . Call MD for:  persistant nausea and vomiting  . Call MD for:  severe uncontrolled pain  . Call MD for:  redness, tenderness, or signs of infection (pain, swelling, redness,  odor or green/yellow discharge around incision site)  . Call MD for:  difficulty breathing, headache or visual disturbances  . Call MD for:  hives  . Call MD for:  persistant dizziness or light-headedness  . Call  MD for:  extreme fatigue  . Discontinue IV  . Discharge patient Discharge disposition: 01-Home or Self Care; Discharge patient date: 06/04/2018   No orders of the defined types were placed in this encounter.   MDM: NST, labs, US, and pe with chart review, pt stable discharged.   Assessment:  Susan Krause is a 27 y.o. G1P0 at 7576w4d dx with the following 1. Braxton Hick's contraction   2. Vaginal bleeding   3. [redacted] weeks gestation of pregnancy   NSt reactive, cat 1, no cervical change over 2 hours, negative Ffn, neg wet prep , ua negative for uti, will send culture. Send swabs for g/c. No bleeding at visit, pt stable and discharged home.   Consulted with Dr Su Hiltoberts and aware of pt status and agrees with POC.   Plan: Discharge home in stable condition.  Pending : UC and G/C Labor precautions and fetal kick counts Follow-up Information    [facility].   Specialty:  Obstetrics and Gynecology Why:  06/08/2018 with ROB and US already scheduled  Contact information: [address] [place] [phone]          Allergies as of 06/04/2018      Reactions   Contrast Media [iodinated Diagnostic Agents] Nausea And Vomiting   Gardasil [hpv Vaccine Recombinant (yeast Derived)]    Peanut Oil Swelling   Tongue swells/itching   Pumpkin Flavor Swelling   Hpv Bival (type 16,18) Recomb Vaccine  [human Papillomavirus (16,18) Recomb Vac] Hives   Lactose Intolerance (gi)       Medication List    TAKE these medications   butalbital-acetaminophen-caffeine 50-325-40 MG tablet Commonly known as:  FIORICET, ESGIC Take by mouth every 6 (six) hours as needed for headache.   propranolol 10 MG  tablet Commonly known as:  INDERAL Take 10 mg by mouth daily.       [facility] Kathlee Barnhardt NP-C, CNM [place], Ivelise Castillo, [place] 06/04/2018 6:46 PM

## 2018-06-04 NOTE — Discharge Instructions (Signed)

## 2018-06-05 LAB — GC/CHLAMYDIA PROBE AMP (~~LOC~~) NOT AT ARMC
CHLAMYDIA, DNA PROBE: NEGATIVE
Neisseria Gonorrhea: NEGATIVE

## 2018-06-06 LAB — URINE CULTURE: Culture: NO GROWTH

## 2018-06-26 LAB — OB RESULTS CONSOLE GC/CHLAMYDIA
CHLAMYDIA, DNA PROBE: NEGATIVE
Gonorrhea: NEGATIVE

## 2018-06-26 LAB — OB RESULTS CONSOLE GBS: STREP GROUP B AG: NEGATIVE

## 2018-07-07 ENCOUNTER — Inpatient Hospital Stay (HOSPITAL_COMMUNITY)
Admission: AD | Admit: 2018-07-07 | Discharge: 2018-07-07 | Disposition: A | Discharge status: Home / Self Care | Payer: 59 | Source: Ambulatory Visit | Attending: Obstetrics & Gynecology | Admitting: Obstetrics & Gynecology

## 2018-07-07 ENCOUNTER — Encounter (HOSPITAL_COMMUNITY): Payer: Self-pay

## 2018-07-07 DIAGNOSIS — Z3A37 37 weeks gestation of pregnancy: Secondary | ICD-10-CM | POA: Insufficient documentation

## 2018-07-07 DIAGNOSIS — O26893 Other specified pregnancy related conditions, third trimester: Secondary | ICD-10-CM | POA: Insufficient documentation

## 2018-07-07 DIAGNOSIS — B9689 Other specified bacterial agents as the cause of diseases classified elsewhere: Secondary | ICD-10-CM

## 2018-07-07 DIAGNOSIS — N760 Acute vaginitis: Secondary | ICD-10-CM | POA: Insufficient documentation

## 2018-07-07 DIAGNOSIS — M7989 Other specified soft tissue disorders: Secondary | ICD-10-CM | POA: Diagnosis present

## 2018-07-07 DIAGNOSIS — O23599 Infection of other part of genital tract in pregnancy, unspecified trimester: Secondary | ICD-10-CM

## 2018-07-07 DIAGNOSIS — O1203 Gestational edema, third trimester: Secondary | ICD-10-CM

## 2018-07-07 DIAGNOSIS — N898 Other specified noninflammatory disorders of vagina: Secondary | ICD-10-CM | POA: Diagnosis present

## 2018-07-07 LAB — WET PREP, GENITAL
Sperm: NONE SEEN
TRICH WET PREP: NONE SEEN
Yeast Wet Prep HPF POC: NONE SEEN

## 2018-07-07 LAB — URINALYSIS, ROUTINE W REFLEX MICROSCOPIC
BILIRUBIN URINE: NEGATIVE
GLUCOSE, UA: NEGATIVE mg/dL
HGB URINE DIPSTICK: NEGATIVE
KETONES UR: NEGATIVE mg/dL
Leukocytes, UA: NEGATIVE
NITRITE: NEGATIVE
PH: 7 (ref 5.0–8.0)
Protein, ur: NEGATIVE mg/dL
Specific Gravity, Urine: 1.005 (ref 1.005–1.030)

## 2018-07-07 LAB — POCT FERN TEST: POCT FERN TEST: NEGATIVE

## 2018-07-07 MED ORDER — METRONIDAZOLE 500 MG PO TABS: 500.0000 mg | ORAL_TABLET | Freq: Three times a day (TID) | ORAL | 0 refills | Status: DC

## 2018-07-07 NOTE — Progress Notes (Signed)
Chief Complaint:  Foot Swelling; Headache; and hands swelling   None    HPI: Susan Krause is a 27 y.o. G1P0 at [redacted]w[redacted]d who presents to maternity admissions reporting increased foot swelling and leakage of fluid.  Location: vaginal/feet Severity: moderate pedal swelling Duration: days Timing: worse at evenings Modifying factors: None Associated signs and symptoms: Has a mild headache and noticed some leakage of fluids  Denies contractions, or vaginal bleeding. Good fetal movement.   Pregnancy Course:   Past Medical History:  Diagnosis Date  . ADHD (attention deficit hyperactivity disorder)    No meds  . Cervical dysplasia   . Chlamydia   . Migraine    Propanolol 10 mg  . PTSD (post-traumatic stress disorder)    no meds   OB History  Gravida Para Term Preterm AB Living  1            SAB TAB Ectopic Multiple Live Births               # Outcome Date GA Lbr Len/2nd Weight Sex Delivery Anes PTL Lv  1 Current            Past Surgical History:  Procedure Laterality Date  . WISDOM TOOTH EXTRACTION     Family History  Problem Relation Age of Onset  . ADD / ADHD Mother   . Anxiety disorder Mother   . Depression Mother   . Hyperlipidemia Mother   . Obesity Mother   . ADD / ADHD Father   . Anxiety disorder Sister   . Asthma Sister   . Depression Sister   . Intellectual disability Brother   . Learning disabilities Brother   . ADD / ADHD Sister   . Anxiety disorder Maternal Aunt   . Diabetes Maternal Aunt   . Alcohol abuse Maternal Uncle   . Hyperlipidemia Maternal Uncle   . Anxiety disorder Paternal Uncle   . Diabetes Paternal Uncle   . Anxiety disorder Maternal Grandmother   . Diabetes Maternal Grandmother   . Hyperlipidemia Maternal Grandmother   . Varicose Veins Maternal Grandmother   . Cancer Maternal Grandfather   . Hyperlipidemia Maternal Grandfather   . Obesity Maternal Grandfather   . Vision loss Maternal Grandfather   . Cancer Paternal Grandmother     Social History   Tobacco Use  . Smoking status: Never Smoker  . Smokeless tobacco: Never Used  Substance Use Topics  . Alcohol use: No  . Drug use: No   Allergies  Allergen Reactions  . Contrast Media [Iodinated Diagnostic Agents] Nausea And Vomiting  . Gardasil [Hpv Vaccine Recombinant (Yeast Derived)]   . Peanut Oil Swelling    Tongue swells/itching  . Pumpkin Flavor Swelling  . Hpv Bival (Type 16,18) Recomb Vaccine  [Human Papillomavirus (16,18) Recomb Vac] Hives  . Lactose Intolerance (Gi)    Medications Prior to Admission  Medication Sig Dispense Refill Last Dose  . Prenatal Vit-Fe Fumarate-FA (MULTIVITAMIN-PRENATAL) 27-0.8 MG TABS tablet Take 1 tablet by mouth daily at 12 noon.   07/06/2018 at Unknown time  . propranolol (INDERAL) 10 MG tablet Take 10 mg by mouth daily.   07/06/2018 at Unknown time  . butalbital-acetaminophen-caffeine (FIORICET, ESGIC) 50-325-40 MG tablet Take by mouth every 6 (six) hours as needed for headache.   Taking    I have reviewed patient's Past Medical Hx, Surgical Hx, Family Hx, Social Hx, medications and allergies.   ROS:  Review of Systems  Physical Exam   Patient Vitals for the  past 24 hrs:  BP Temp Pulse Resp Height Weight  07/07/18 2030 118/79 - (!) 102 - - -  07/07/18 2007 136/87 - (!) 101 - - -  07/07/18 2001 - 98 F (36.7 C) - 18 5\' 2"  (1.575 m) 106.6 kg   Constitutional: Well-developed, well-nourished female in no acute distress.  Cardiovascular: normal rate Respiratory: normal effort GI: Abd soft, non-tender, gravid appropriate for gestational age. Pos BS x 4 MS: Extremities nontender, no edema, normal ROM Neurologic: Alert and oriented x 4.  GU: Neg CVAT.  Pelvic: NEFG, THICK WHITE DISCHARGE, no blood, cervix clean. No CMT   NEG FERN  SVE: 1/50/-3, posterior  FHT:  Baseline 120 , moderate variability, accelerations present, no decelerations Contractions: None   Labs: Results for orders placed or performed during  the hospital encounter of 07/07/18 (from the past 24 hour(s))  Urinalysis, Routine w reflex microscopic     Status: Abnormal   Collection Time: 07/07/18  8:22 PM  Result Value Ref Range   Color, Urine STRAW (A) YELLOW   APPearance CLEAR CLEAR   Specific Gravity, Urine 1.005 1.005 - 1.030   pH 7.0 5.0 - 8.0   Glucose, UA NEGATIVE NEGATIVE mg/dL   Hgb urine dipstick NEGATIVE NEGATIVE   Bilirubin Urine NEGATIVE NEGATIVE   Ketones, ur NEGATIVE NEGATIVE mg/dL   Protein, ur NEGATIVE NEGATIVE mg/dL   Nitrite NEGATIVE NEGATIVE   Leukocytes, UA NEGATIVE NEGATIVE    Imaging:  No results found.  MAU Course: Orders Placed This Encounter  Procedures  . Urinalysis, Routine w reflex microscopic   No orders of the defined types were placed in this encounter.   MDM: Nst, wet smear, fern  Assessment: 1. Bacterial vaginosis in pregnancy   2. Edema in pregnancy in third trimester     Plan: Discharge home in stable condition.  Labor precautions and fetal kick counts Reviewed compression stockings, elevation, decrease salt intake, s/s Pre-E reviewed. Reviewed flagyl use, side effects.     Altamese Cabal, [place] 07/07/2018 9:23 PM

## 2018-07-07 NOTE — Discharge Instructions (Signed)

## 2018-07-07 NOTE — MAU Note (Signed)
CNM notified of Pt. Ctx 2-3 min. CNM asked for RN labor Check. Pt. 1 50 and very posterior. CNM notified and ordered discharge. Pt. Educated on reasons to return.

## 2018-07-07 NOTE — MAU Note (Signed)
Have had a headache since 1500. Feet, legs, and hands a lot more swollen this afternoon. Denies LOF or bleeding. Think may have lost mucous plug. Watery d/c for past few days.

## 2018-07-14 ENCOUNTER — Encounter (HOSPITAL_COMMUNITY): Payer: Self-pay | Admitting: *Deleted

## 2018-07-14 ENCOUNTER — Inpatient Hospital Stay (EMERGENCY_DEPARTMENT_HOSPITAL)
Admission: AD | Admit: 2018-07-14 | Discharge: 2018-07-15 | Disposition: A | Discharge status: Home / Self Care | Payer: 59 | Source: Ambulatory Visit | Attending: Obstetrics and Gynecology | Admitting: Obstetrics and Gynecology

## 2018-07-14 DIAGNOSIS — Z3A38 38 weeks gestation of pregnancy: Secondary | ICD-10-CM

## 2018-07-14 DIAGNOSIS — O133 Gestational [pregnancy-induced] hypertension without significant proteinuria, third trimester: Secondary | ICD-10-CM

## 2018-07-14 DIAGNOSIS — O479 False labor, unspecified: Secondary | ICD-10-CM | POA: Diagnosis not present

## 2018-07-14 DIAGNOSIS — O134 Gestational [pregnancy-induced] hypertension without significant proteinuria, complicating childbirth: Secondary | ICD-10-CM | POA: Diagnosis not present

## 2018-07-14 DIAGNOSIS — O368130 Decreased fetal movements, third trimester, not applicable or unspecified: Secondary | ICD-10-CM

## 2018-07-14 DIAGNOSIS — O471 False labor at or after 37 completed weeks of gestation: Secondary | ICD-10-CM | POA: Insufficient documentation

## 2018-07-14 DIAGNOSIS — Z3689 Encounter for other specified antenatal screening: Secondary | ICD-10-CM

## 2018-07-14 NOTE — MAU Note (Addendum)
Ctxs off and on all night. Able to sleep some from 0800-1200. Woke up with ctxs and lower back pain which are getting worse. I have drank a lot of water but do not seem to urinate as much as usual. Denies vag bleeding. Some white vag d/c. Baby is moving but does not seem to be as much as usual

## 2018-07-14 NOTE — MAU Provider Note (Signed)
Chief Complaint:  Contractions and Decreased Fetal Movement   First Provider Initiated Contact with Patient 07/14/18 2335      HPI: Susan Krause is a 27 y.o. G1P0 at 4w2dwho presents to maternity admissions reporting cramping and contractions and decreased fetal movement. She reports she is feeling movement, but less tonight than usual.  Her cramping is every 3-4 minutes and some of them are hard to talk through, others are not as strong. She has tried resting, drinking water, heating pad but the cramping stays the same.  It radiates to her low back.  There are no other symptoms.  She denies h/a, epigastric pain, or visual disturbances.  She is feeling more fetal movement while in MAU.  HPI  Past Medical History: Past Medical History:  Diagnosis Date  . ADHD (attention deficit hyperactivity disorder)    No meds  . Cervical dysplasia   . Chlamydia   . Migraine    Propanolol 10 mg  . PTSD (post-traumatic stress disorder)    no meds    Past obstetric history: OB History  Gravida Para Term Preterm AB Living  1            SAB TAB Ectopic Multiple Live Births               # Outcome Date GA Lbr Len/2nd Weight Sex Delivery Anes PTL Lv  1 Current             Past Surgical History: Past Surgical History:  Procedure Laterality Date  . WISDOM TOOTH EXTRACTION      Family History: Family History  Problem Relation Age of Onset  . ADD / ADHD Mother   . Anxiety disorder Mother   . Depression Mother   . Hyperlipidemia Mother   . Obesity Mother   . ADD / ADHD Father   . Anxiety disorder Sister   . Asthma Sister   . Depression Sister   . Intellectual disability Brother   . Learning disabilities Brother   . ADD / ADHD Sister   . Anxiety disorder Maternal Aunt   . Diabetes Maternal Aunt   . Alcohol abuse Maternal Uncle   . Hyperlipidemia Maternal Uncle   . Anxiety disorder Paternal Uncle   . Diabetes Paternal Uncle   . Anxiety disorder Maternal Grandmother   . Diabetes Maternal  Grandmother   . Hyperlipidemia Maternal Grandmother   . Varicose Veins Maternal Grandmother   . Cancer Maternal Grandfather   . Hyperlipidemia Maternal Grandfather   . Obesity Maternal Grandfather   . Vision loss Maternal Grandfather   . Cancer Paternal Grandmother     Social History: Social History   Tobacco Use  . Smoking status: Never Smoker  . Smokeless tobacco: Never Used  Substance Use Topics  . Alcohol use: No  . Drug use: No    Allergies:  Allergies  Allergen Reactions  . Contrast Media [Iodinated Diagnostic Agents] Nausea And Vomiting  . Gardasil [Hpv Vaccine Recombinant (Yeast Derived)]   . Peanut Oil Swelling    Tongue swells/itching  . Pumpkin Flavor Swelling  . Hpv Bival (Type 16,18) Recomb Vaccine  [Human Papillomavirus (16,18) Recomb Vac] Hives  . Lactose Intolerance (Gi)     Meds:  No medications prior to admission.    ROS:  Review of Systems   I have reviewed patient's Past Medical Hx, Surgical Hx, Family Hx, Social Hx, medications and allergies.   Physical Exam   Patient Vitals for the past 24 hrs:  BP  Temp Temp src Pulse Resp Height Weight  07/15/18 0216 115/80 97.9 F (36.6 C) Oral (!) 104 18 - -  07/15/18 0134 134/84 - - (!) 102 - - -  07/14/18 2340 130/84 - - (!) 108 - - -  07/14/18 2330 129/79 - - (!) 107 - - -  07/14/18 2320 134/90 - - (!) 111 - - -  07/14/18 2317 (!) 132/91 - - (!) 113 - - -  07/14/18 2257 (!) 143/91 - - (!) 115 - - -  07/14/18 2254 - 97.9 F (36.6 C) - - 20 5\' 2"  (1.575 m) 108 kg   Constitutional: Well-developed, well-nourished female in no acute distress.  HEART: normal rate, heart sounds, regular rhythm RESP: normal effort, lung sounds clear and equal bilaterally GI: Abd soft, non-tender, gravid appropriate for gestational age.  MS: Extremities nontender, no edema, normal ROM Neurologic: Alert and oriented x 4.  GU: Neg CVAT.   Dilation: 2 Effacement (%): 50 Station: -2 Presentation: Vertex Exam by::  Arbie Cookey CNM   FHT:  Baseline 135 , moderate variability, accelerations present, no decelerations Contractions: q 3 mins   Labs: Results for orders placed or performed during the hospital encounter of 07/14/18 (from the past 24 hour(s))  Urinalysis, Routine w reflex microscopic     Status: Abnormal   Collection Time: 07/14/18 11:05 PM  Result Value Ref Range   Color, Urine YELLOW YELLOW   APPearance HAZY (A) CLEAR   Specific Gravity, Urine 1.015 1.005 - 1.030   pH 6.0 5.0 - 8.0   Glucose, UA NEGATIVE NEGATIVE mg/dL   Hgb urine dipstick NEGATIVE NEGATIVE   Bilirubin Urine NEGATIVE NEGATIVE   Ketones, ur NEGATIVE NEGATIVE mg/dL   Protein, ur NEGATIVE NEGATIVE mg/dL   Nitrite NEGATIVE NEGATIVE   Leukocytes, UA TRACE (A) NEGATIVE   RBC / HPF 0-5 0 - 5 RBC/hpf   WBC, UA 0-5 0 - 5 WBC/hpf   Bacteria, UA FEW (A) NONE SEEN   Squamous Epithelial / LPF 0-5 0 - 5   Mucus PRESENT   Protein / creatinine ratio, urine     Status: None   Collection Time: 07/14/18 11:05 PM  Result Value Ref Range   Creatinine, Urine 101.00 mg/dL   Total Protein, Urine 12 mg/dL   Protein Creatinine Ratio 0.12 0.00 - 0.15 mg/mg[Cre]  CBC     Status: Abnormal   Collection Time: 07/14/18 11:54 PM  Result Value Ref Range   WBC 13.3 (H) 4.0 - 10.5 K/uL   RBC 4.30 3.87 - 5.11 MIL/uL   Hemoglobin 12.6 12.0 - 15.0 g/dL   HCT 16.1 09.6 - 04.5 %   MCV 86.5 80.0 - 100.0 fL   MCH 29.3 26.0 - 34.0 pg   MCHC 33.9 30.0 - 36.0 g/dL   RDW 40.9 81.1 - 91.4 %   Platelets 219 150 - 400 K/uL   nRBC 0.0 0.0 - 0.2 %  Comprehensive metabolic panel     Status: Abnormal   Collection Time: 07/14/18 11:54 PM  Result Value Ref Range   Sodium 135 135 - 145 mmol/L   Potassium 3.7 3.5 - 5.1 mmol/L   Chloride 103 98 - 111 mmol/L   CO2 21 (L) 22 - 32 mmol/L   Glucose, Bld 86 70 - 99 mg/dL   BUN 9 6 - 20 mg/dL   Creatinine, Ser 7.82 (L) 0.44 - 1.00 mg/dL   Calcium 9.0 8.9 - 95.6 mg/dL   Total Protein 7.1 6.5 -  8.1  g/dL   Albumin 3.1 (L) 3.5 - 5.0 g/dL   AST 21 15 - 41 U/L   ALT 18 0 - 44 U/L   Alkaline Phosphatase 124 38 - 126 U/L   Total Bilirubin 0.3 0.3 - 1.2 mg/dL   GFR calc non Af Amer >60 >60 mL/min   GFR calc Af Amer >60 >60 mL/min   Anion gap 11 5 - 15   --/--/O POS Performed at [facility], [address] [place], [place] [address]  [phone])  Imaging:  No results found.  MAU Course/MDM: I have ordered labs and reviewed results.  NST reviewed and reactive Preeclampsia labs normal with P/C ratio 0.12 Pt with elevated BP on arrival but then normotensive, last BP 115/80 Does not meet criteria for GHTN with new onset BP, then resolution, no 2 BPs >6 hours apart Pt stable, no symptoms No evidence of labor with no cervical change in 2+ hours Stadol 1 mg /Phenergan 25 mg IM given for therapeutic rest D/C home F/U in office with BP check in 2 days, on Monday Return to MAU as needed for emergencies. Pt discharge with strict preeclampsia/labor precautions.  Today's evaluation included a work-up for preterm labor which can be life-threatening for both mom and baby.  Assessment: 1. False labor   2. Transient hypertension of pregnancy in third trimester   3. Decreased fetal movements in third trimester, single or unspecified fetus   4. NST (non-stress test) reactive     Plan: Discharge home Labor precautions and fetal kick counts Follow-up Information    Geryl Rankins, MD Follow up.   Specialty:  Obstetrics and Gynecology Why:  As scheduled. Return to MAU as needed for signs of labor or emergencies. Contact information: [address] [place] [address] [phone]          Allergies as of 07/15/2018      Reactions   Contrast Media [iodinated Diagnostic Agents] Nausea And Vomiting   Gardasil [hpv Vaccine Recombinant (yeast Derived)]    Peanut Oil Swelling   Tongue swells/itching   Pumpkin Flavor Swelling   Hpv Bival (type 16,18) Recomb  Vaccine  [human Papillomavirus (16,18) Recomb Vac] Hives   Lactose Intolerance (gi)       Medication List    STOP taking these medications   metroNIDAZOLE 500 MG tablet Commonly known as:  FLAGYL     TAKE these medications   butalbital-acetaminophen-caffeine 50-325-40 MG tablet Commonly known as:  FIORICET, ESGIC Take by mouth every 6 (six) hours as needed for headache.   multivitamin-prenatal 27-0.8 MG Tabs tablet Take 1 tablet by mouth daily at 12 noon.   propranolol 10 MG tablet Commonly known as:  INDERAL Take 10 mg by mouth daily.       Sharen Counter Certified Nurse-[name] 07/15/2018 3:55 AM

## 2018-07-15 DIAGNOSIS — O479 False labor, unspecified: Secondary | ICD-10-CM | POA: Diagnosis not present

## 2018-07-15 LAB — URINALYSIS, ROUTINE W REFLEX MICROSCOPIC
BILIRUBIN URINE: NEGATIVE
Glucose, UA: NEGATIVE mg/dL
HGB URINE DIPSTICK: NEGATIVE
Ketones, ur: NEGATIVE mg/dL
NITRITE: NEGATIVE
PH: 6 (ref 5.0–8.0)
Protein, ur: NEGATIVE mg/dL
SPECIFIC GRAVITY, URINE: 1.015 (ref 1.005–1.030)

## 2018-07-15 LAB — COMPREHENSIVE METABOLIC PANEL
ALT: 18 U/L (ref 0–44)
ANION GAP: 11 (ref 5–15)
AST: 21 U/L (ref 15–41)
Albumin: 3.1 g/dL — ABNORMAL LOW (ref 3.5–5.0)
Alkaline Phosphatase: 124 U/L (ref 38–126)
BILIRUBIN TOTAL: 0.3 mg/dL (ref 0.3–1.2)
BUN: 9 mg/dL (ref 6–20)
CO2: 21 mmol/L — ABNORMAL LOW (ref 22–32)
Calcium: 9 mg/dL (ref 8.9–10.3)
Chloride: 103 mmol/L (ref 98–111)
Creatinine, Ser: 0.42 mg/dL — ABNORMAL LOW (ref 0.44–1.00)
GFR calc Af Amer: >60 mL/min (ref >60)
Glucose, Bld: 86 mg/dL (ref 70–99)
POTASSIUM: 3.7 mmol/L (ref 3.5–5.1)
Sodium: 135 mmol/L (ref 135–145)
TOTAL PROTEIN: 7.1 g/dL (ref 6.5–8.1)

## 2018-07-15 LAB — CBC
HEMATOCRIT: 37.2 % (ref 36.0–46.0)
Hemoglobin: 12.6 g/dL (ref 12.0–15.0)
MCH: 29.3 pg (ref 26.0–34.0)
MCHC: 33.9 g/dL (ref 30.0–36.0)
MCV: 86.5 fL (ref 80.0–100.0)
Platelets: 219 10*3/uL (ref 150–400)
RBC: 4.3 MIL/uL (ref 3.87–5.11)
RDW: 14 % (ref 11.5–15.5)
WBC: 13.3 10*3/uL — ABNORMAL HIGH (ref 4.0–10.5)
nRBC: 0 % (ref 0.0–0.2)

## 2018-07-15 LAB — PROTEIN / CREATININE RATIO, URINE
CREATININE, URINE: 101 mg/dL
PROTEIN CREATININE RATIO: 0.12 mg/mg{creat} (ref 0.00–0.15)
TOTAL PROTEIN, URINE: 12 mg/dL

## 2018-07-15 MED ORDER — BUTORPHANOL TARTRATE 1 MG/ML IJ SOLN
1.0000 mg | Freq: Once | INTRAMUSCULAR | Status: DC
  Filled 2018-07-15: qty 1

## 2018-07-15 MED ORDER — PROMETHAZINE HCL 25 MG/ML IJ SOLN: 25.0000 mg | Freq: Four times a day (QID) | INTRAMUSCULAR | Status: DC | PRN

## 2018-07-15 MED ORDER — PROMETHAZINE HCL 25 MG/ML IJ SOLN
25.0000 mg | Freq: Four times a day (QID) | INTRAMUSCULAR | Status: DC | PRN
  Filled 2018-07-15: qty 1

## 2018-07-15 NOTE — Discharge Instructions (Signed)

## 2018-07-17 ENCOUNTER — Inpatient Hospital Stay (HOSPITAL_COMMUNITY): Payer: 59 | Admitting: Anesthesiology

## 2018-07-17 ENCOUNTER — Other Ambulatory Visit: Payer: Self-pay

## 2018-07-17 ENCOUNTER — Inpatient Hospital Stay (HOSPITAL_COMMUNITY)
Admission: AD | Admit: 2018-07-17 | Discharge: 2018-07-21 | DRG: 788 | Disposition: A | Discharge status: Home / Self Care | Payer: 59 | Attending: Obstetrics & Gynecology | Admitting: Obstetrics & Gynecology

## 2018-07-17 ENCOUNTER — Encounter (HOSPITAL_COMMUNITY): Payer: Self-pay

## 2018-07-17 DIAGNOSIS — D649 Anemia, unspecified: Secondary | ICD-10-CM | POA: Diagnosis present

## 2018-07-17 DIAGNOSIS — O9902 Anemia complicating childbirth: Secondary | ICD-10-CM | POA: Diagnosis present

## 2018-07-17 DIAGNOSIS — O134 Gestational [pregnancy-induced] hypertension without significant proteinuria, complicating childbirth: Secondary | ICD-10-CM | POA: Diagnosis present

## 2018-07-17 DIAGNOSIS — Z3A38 38 weeks gestation of pregnancy: Secondary | ICD-10-CM | POA: Diagnosis not present

## 2018-07-17 DIAGNOSIS — F4312 Post-traumatic stress disorder, chronic: Secondary | ICD-10-CM | POA: Diagnosis present

## 2018-07-17 DIAGNOSIS — Z3490 Encounter for supervision of normal pregnancy, unspecified, unspecified trimester: Secondary | ICD-10-CM | POA: Diagnosis present

## 2018-07-17 DIAGNOSIS — F419 Anxiety disorder, unspecified: Secondary | ICD-10-CM | POA: Diagnosis present

## 2018-07-17 DIAGNOSIS — O324XX0 Maternal care for high head at term, not applicable or unspecified: Secondary | ICD-10-CM | POA: Diagnosis present

## 2018-07-17 DIAGNOSIS — O99214 Obesity complicating childbirth: Secondary | ICD-10-CM | POA: Diagnosis present

## 2018-07-17 DIAGNOSIS — O99344 Other mental disorders complicating childbirth: Secondary | ICD-10-CM | POA: Diagnosis present

## 2018-07-17 LAB — COMPREHENSIVE METABOLIC PANEL
ALT: 16 U/L (ref 0–44)
AST: 20 U/L (ref 15–41)
Albumin: 2.8 g/dL — ABNORMAL LOW (ref 3.5–5.0)
Alkaline Phosphatase: 122 U/L (ref 38–126)
Anion gap: 9 (ref 5–15)
BUN: 6 mg/dL (ref 6–20)
CO2: 20 mmol/L — ABNORMAL LOW (ref 22–32)
Calcium: 9.1 mg/dL (ref 8.9–10.3)
Chloride: 107 mmol/L (ref 98–111)
Creatinine, Ser: 0.47 mg/dL (ref 0.44–1.00)
GFR calc Af Amer: >60 mL/min (ref >60)
GFR calc non Af Amer: >60 mL/min (ref >60)
Glucose, Bld: 117 mg/dL — ABNORMAL HIGH (ref 70–99)
Potassium: 3.5 mmol/L (ref 3.5–5.1)
Sodium: 136 mmol/L (ref 135–145)
Total Bilirubin: 0.6 mg/dL (ref 0.3–1.2)
Total Protein: 5.9 g/dL — ABNORMAL LOW (ref 6.5–8.1)

## 2018-07-17 LAB — CBC
HCT: 34.4 % — ABNORMAL LOW (ref 36.0–46.0)
Hemoglobin: 11.5 g/dL — ABNORMAL LOW (ref 12.0–15.0)
MCH: 28.9 pg (ref 26.0–34.0)
MCHC: 33.4 g/dL (ref 30.0–36.0)
MCV: 86.4 fL (ref 80.0–100.0)
Platelets: 217 10*3/uL (ref 150–400)
RBC: 3.98 MIL/uL (ref 3.87–5.11)
RDW: 14.3 % (ref 11.5–15.5)
WBC: 11 10*3/uL — ABNORMAL HIGH (ref 4.0–10.5)

## 2018-07-17 LAB — URINALYSIS, ROUTINE W REFLEX MICROSCOPIC
Bilirubin Urine: NEGATIVE
Glucose, UA: NEGATIVE mg/dL
Hgb urine dipstick: NEGATIVE
Ketones, ur: NEGATIVE mg/dL
Nitrite: NEGATIVE
Protein, ur: NEGATIVE mg/dL
Specific Gravity, Urine: 1.009 (ref 1.005–1.030)
pH: 6 (ref 5.0–8.0)

## 2018-07-17 LAB — PROTEIN / CREATININE RATIO, URINE
Creatinine, Urine: 69 mg/dL
Protein Creatinine Ratio: 0.12 mg/mg{Cre} (ref 0.00–0.15)
Total Protein, Urine: 8 mg/dL

## 2018-07-17 LAB — TYPE AND SCREEN
ABO/RH(D): O POS
ANTIBODY SCREEN: NEGATIVE

## 2018-07-17 LAB — RPR: RPR Ser Ql: NONREACTIVE

## 2018-07-17 LAB — GLUCOSE, CAPILLARY: Glucose-Capillary: 93 mg/dL (ref 70–99)

## 2018-07-17 MED ORDER — LIDOCAINE HCL (PF) 1 % IJ SOLN
30.0000 mL | INTRAMUSCULAR | Status: DC | PRN
  Filled 2018-07-17 (×2): qty 30

## 2018-07-17 MED ORDER — OXYCODONE-ACETAMINOPHEN 5-325 MG PO TABS: 2.0000 | ORAL_TABLET | ORAL | Status: DC | PRN

## 2018-07-17 MED ORDER — MISOPROSTOL 25 MCG QUARTER TABLET
25.0000 ug | ORAL_TABLET | ORAL | Status: DC | PRN
  Administered 2018-07-17: 25 ug via VAGINAL
  Filled 2018-07-17: qty 1

## 2018-07-17 MED ORDER — LABETALOL HCL 5 MG/ML IV SOLN: 40.0000 mg | INTRAVENOUS | Status: DC | PRN

## 2018-07-17 MED ORDER — LABETALOL HCL 5 MG/ML IV SOLN: 80.0000 mg | INTRAVENOUS | Status: DC | PRN

## 2018-07-17 MED ORDER — HYDROXYZINE HCL 50 MG PO TABS
50.0000 mg | ORAL_TABLET | ORAL | Status: DC | PRN
  Filled 2018-07-17: qty 1

## 2018-07-17 MED ORDER — PHENYLEPHRINE 40 MCG/ML (10ML) SYRINGE FOR IV PUSH (FOR BLOOD PRESSURE SUPPORT)
80.0000 ug | PREFILLED_SYRINGE | INTRAVENOUS | Status: DC | PRN
  Administered 2018-07-17: 80 ug via INTRAVENOUS

## 2018-07-17 MED ORDER — OXYTOCIN 40 UNITS IN LACTATED RINGERS INFUSION - SIMPLE MED
2.5000 [IU]/h | INTRAVENOUS | Status: DC
  Filled 2018-07-17: qty 1000

## 2018-07-17 MED ORDER — BUTALBITAL-APAP-CAFFEINE 50-325-40 MG PO TABS
1.0000 | ORAL_TABLET | Freq: Once | ORAL | Status: AC
  Administered 2018-07-17: 1 via ORAL
  Filled 2018-07-17: qty 1

## 2018-07-17 MED ORDER — LIDOCAINE HCL (PF) 1 % IJ SOLN
INTRAMUSCULAR | Status: DC | PRN
  Administered 2018-07-17 (×2): 4 mL via EPIDURAL

## 2018-07-17 MED ORDER — EPHEDRINE 5 MG/ML INJ: 10.0000 mg | INTRAVENOUS | Status: DC | PRN

## 2018-07-17 MED ORDER — ONDANSETRON HCL 4 MG/2ML IJ SOLN
4.0000 mg | Freq: Four times a day (QID) | INTRAMUSCULAR | Status: DC | PRN
  Administered 2018-07-17: 4 mg via INTRAVENOUS
  Filled 2018-07-17 (×2): qty 2

## 2018-07-17 MED ORDER — OXYTOCIN BOLUS FROM INFUSION: 500.0000 mL | Freq: Once | INTRAVENOUS | Status: DC

## 2018-07-17 MED ORDER — LACTATED RINGERS IV SOLN: 500.0000 mL | Freq: Once | INTRAVENOUS | Status: DC

## 2018-07-17 MED ORDER — LABETALOL HCL 5 MG/ML IV SOLN
20.0000 mg | INTRAVENOUS | Status: DC | PRN
  Filled 2018-07-17: qty 4

## 2018-07-17 MED ORDER — ACETAMINOPHEN 325 MG PO TABS: 650.0000 mg | ORAL_TABLET | ORAL | Status: DC | PRN

## 2018-07-17 MED ORDER — ONDANSETRON 8 MG PO TBDP
8.0000 mg | ORAL_TABLET | Freq: Once | ORAL | Status: AC
  Administered 2018-07-17: 8 mg via ORAL
  Filled 2018-07-17: qty 1

## 2018-07-17 MED ORDER — OXYTOCIN 40 UNITS IN LACTATED RINGERS INFUSION - SIMPLE MED
1.0000 m[IU]/min | INTRAVENOUS | Status: DC
  Administered 2018-07-17: 2 m[IU]/min via INTRAVENOUS

## 2018-07-17 MED ORDER — PHENYLEPHRINE 40 MCG/ML (10ML) SYRINGE FOR IV PUSH (FOR BLOOD PRESSURE SUPPORT)
80.0000 ug | PREFILLED_SYRINGE | INTRAVENOUS | Status: DC | PRN
  Filled 2018-07-17 (×2): qty 10

## 2018-07-17 MED ORDER — LACTATED RINGERS IV SOLN
500.0000 mL | INTRAVENOUS | Status: DC | PRN
  Administered 2018-07-17: 500 mL via INTRAVENOUS
  Administered 2018-07-18: 1000 mL via INTRAVENOUS

## 2018-07-17 MED ORDER — HYDRALAZINE HCL 20 MG/ML IJ SOLN: 10.0000 mg | INTRAMUSCULAR | Status: DC | PRN

## 2018-07-17 MED ORDER — DIPHENHYDRAMINE HCL 50 MG/ML IJ SOLN: 12.5000 mg | INTRAMUSCULAR | Status: DC | PRN

## 2018-07-17 MED ORDER — LACTATED RINGERS IV SOLN
INTRAVENOUS | Status: DC
  Administered 2018-07-17 – 2018-07-18 (×5): via INTRAVENOUS

## 2018-07-17 MED ORDER — FENTANYL 2.5 MCG/ML BUPIVACAINE 1/10 % EPIDURAL INFUSION (WH - ANES)
14.0000 mL/h | INTRAMUSCULAR | Status: DC | PRN
  Administered 2018-07-17: 12 mL/h via EPIDURAL
  Administered 2018-07-18 (×2): 14 mL/h via EPIDURAL
  Filled 2018-07-17 (×3): qty 100

## 2018-07-17 MED ORDER — TERBUTALINE SULFATE 1 MG/ML IJ SOLN: 0.2500 mg | Freq: Once | INTRAMUSCULAR | Status: DC | PRN

## 2018-07-17 MED ORDER — OXYCODONE-ACETAMINOPHEN 5-325 MG PO TABS: 1.0000 | ORAL_TABLET | ORAL | Status: DC | PRN

## 2018-07-17 MED ORDER — SOD CITRATE-CITRIC ACID 500-334 MG/5ML PO SOLN
30.0000 mL | ORAL | Status: DC | PRN
  Filled 2018-07-17: qty 15

## 2018-07-17 NOTE — Progress Notes (Signed)
Susan Krause is a 27 y.o. G1P0 at [redacted]w[redacted]d admitted for induction of labor due to Hypertension.  Subjective: Patient rates contractions moderate still. She was unchanged on exam. Discussed foley vs pitocin as patient is contracting too often for cytotec to be an option but is not making cervical change and so does need further augmentation. Patient declined foley and elected pitocin. She desires an epidural before starting pitocin and that process has been started.   Objective: BP (!) 110/57   Pulse (!) 108   Temp 97.7 F (36.5 C) (Oral)   Resp 18   Ht 5\' 2"  (1.575 m)   Wt 107.9 kg   LMP 10/19/2017   SpO2 100%   BMI 43.51 kg/m  I/O last 3 completed shifts: In: 222 [P.O.:222] Out: -  No intake/output data recorded.  FHT:  FHR: 135 bpm, variability: moderate,  accelerations:  Present,  decelerations:  Absent UC:   Irregular, every 2-62min SVE:   Dilation: 3 Effacement (%): 50 Station: -2 Exam by:: [employer] Avea Mcgowen,CNM   Labs: Lab Results  Component Value Date   WBC 11.0 (H) 07/17/2018   HGB 11.5 (L) 07/17/2018   HCT 34.4 (L) 07/17/2018   MCV 86.4 07/17/2018   PLT 217 07/17/2018    Assessment / Plan: Induction of labor due to gestational hypertension  Labor: Progressing normally Preeclampsia:  no signs or symptoms of toxicity, intake and ouput balanced and labs stable Fetal Wellbeing:  Category I Pain Control:  Labor support without medications I/D:  n/a Anticipated MOD:  NSVD  [name] 07/17/2018, 4:35 PM

## 2018-07-17 NOTE — Anesthesia Procedure Notes (Signed)
Epidural Patient location during procedure: OB Start time: 07/17/2018 5:10 PM End time: 07/17/2018 5:19 PM  Staffing Anesthesiologist: Mal Amabile, MD Performed: anesthesiologist   Preanesthetic Checklist Completed: patient identified, site marked, surgical consent, pre-op evaluation, timeout performed, IV checked, risks and benefits discussed and monitors and equipment checked  Epidural Patient position: sitting Prep: site prepped and draped and DuraPrep Patient monitoring: continuous pulse ox and blood pressure Approach: midline Location: L3-L4 Injection technique: LOR air  Needle:  Needle type: Tuohy  Needle gauge: 17 G Needle length: 9 cm and 9 Needle insertion depth: 5 cm cm Catheter type: closed end flexible Catheter size: 19 Gauge Catheter at skin depth: 10 cm Test dose: negative and Other  Assessment Events: blood not aspirated, injection not painful, no injection resistance, negative IV test and no paresthesia  Additional Notes Patient identified. Risks and benefits discussed including failed block, incomplete  Pain control, post dural puncture headache, nerve damage, paralysis, blood pressure Changes, nausea, vomiting, reactions to medications-both toxic and allergic and post Partum back pain. All questions were answered. Patient expressed understanding and wished to proceed. Sterile technique was used throughout procedure. Epidural site was Dressed with sterile barrier dressing. No paresthesias, signs of intravascular injection Or signs of intrathecal spread were encountered.  Patient was more comfortable after the epidural was dosed. Please see RN's note for documentation of vital signs and FHR which are stable.

## 2018-07-17 NOTE — Progress Notes (Signed)
Susan Krause is a 27 y.o. G1P0 at [redacted]w[redacted]d admitted for induction of labor due to Hypertension.  Subjective: Patient having moderately strong contractions which have spaced out a little bit. Discussed possibly inserting foley balloon depending on cervical exam but patient declined at this time. She's considering epidural soon and I will check and reassess need for foley after epidural placement.   Objective: BP 108/72   Pulse 100   Temp 97.9 F (36.6 C) (Oral)   Resp 18   Ht 5\' 2"  (1.575 m)   Wt 107.9 kg   LMP 10/19/2017   SpO2 100%   BMI 43.51 kg/m  I/O last 3 completed shifts: In: 222 [P.O.:222] Out: -  No intake/output data recorded.  FHT:  FHR: 130 bpm, variability: moderate,  accelerations:  Present,  decelerations:  Absent UC:   Irregular, every 2-56min SVE:   Dilation: 3 Effacement (%): 50 Station: -2 Exam by:: [employer] Amoni Scallan,CNM   Labs: Lab Results  Component Value Date   WBC 11.0 (H) 07/17/2018   HGB 11.5 (L) 07/17/2018   HCT 34.4 (L) 07/17/2018   MCV 86.4 07/17/2018   PLT 217 07/17/2018    Assessment / Plan: Induction of labor due to gestational hypertension  Labor: Progressing normally Preeclampsia:  no signs or symptoms of toxicity, intake and ouput balanced and labs stable Fetal Wellbeing:  Category I Pain Control:  Labor support without medications I/D:  n/a Anticipated MOD:  NSVD  [name] 07/17/2018, 2:48 PM

## 2018-07-17 NOTE — MAU Note (Addendum)
Pt with HA's starting around 9:30a yesterday. C/o blurred vision and nausea. No vomiting or epigastric pain. Pt with +1 pitting edema. Pt c/o chest pain around midnight on/off; resolving about 30 min ago.CNM made aware.  Irregular cxts since 07/14/18; however none at present time.   Adah Perl RN

## 2018-07-17 NOTE — MAU Provider Note (Addendum)
History     CSN: [id number]  Arrival date and time: 07/17/18 4098   First Provider Initiated Contact with Patient 07/17/18 0259      Chief Complaint  Patient presents with  . Headache   Susan Krause is a 27 y.o. G1P0 at [redacted]w[redacted]d who presents to MAU with complaints of hypertension, HA, and blurred vision. She reports waking up this morning around 0930 with blurred vision and HA. Went back to sleep to see if HA would relieve itself, woke up to continued HA. Rates pain 6/10- has not taken any medication for HA. She reports blurred vision being resolved prior to arrival to MAU. Hx of migraines since age 68 where she currently takes Propanolol for mingraines. She was seen in MAU on 11/1 for contractions and noted to have Hypertension. After not feeling well today she went to [employer] to take BP around 1900 which noted to be 135/95. She reports Hypertension being associated with nausea and intermittent upper abdominal pain that resolved 30 minutes prior to arrival to MAU. She reports abdominal pain was not specific to one side was located epigastric, LUQ and RUQ that worsened when she laid down. Described pain as aching pain that radiate through chest when she laid down and resolved when sitting up. She reports continued contractions that she denies breathing through, was 2cm on 11/1. She denies vaginal bleeding or vaginal discharge. Reports +FM.    OB History    Gravida  1   Para      Term      Preterm      AB      Living        SAB      TAB      Ectopic      Multiple      Live Births              Past Medical History:  Diagnosis Date  . ADHD (attention deficit hyperactivity disorder)    No meds  . Cervical dysplasia   . Chlamydia   . Migraine    Propanolol 10 mg  . PTSD (post-traumatic stress disorder)    no meds    Past Surgical History:  Procedure Laterality Date  . WISDOM TOOTH EXTRACTION      Family History  Problem Relation Age of Onset  . ADD / ADHD Mother    . Anxiety disorder Mother   . Depression Mother   . Hyperlipidemia Mother   . Obesity Mother   . ADD / ADHD Father   . Anxiety disorder Sister   . Asthma Sister   . Depression Sister   . Intellectual disability Brother   . Learning disabilities Brother   . ADD / ADHD Sister   . Anxiety disorder Maternal Aunt   . Diabetes Maternal Aunt   . Alcohol abuse Maternal Uncle   . Hyperlipidemia Maternal Uncle   . Anxiety disorder Paternal Uncle   . Diabetes Paternal Uncle   . Anxiety disorder Maternal Grandmother   . Diabetes Maternal Grandmother   . Hyperlipidemia Maternal Grandmother   . Varicose Veins Maternal Grandmother   . Cancer Maternal Grandfather   . Hyperlipidemia Maternal Grandfather   . Obesity Maternal Grandfather   . Vision loss Maternal Grandfather   . Cancer Paternal Grandmother     Social History   Tobacco Use  . Smoking status: Never Smoker  . Smokeless tobacco: Never Used  Substance Use Topics  . Alcohol use: No  . Drug use:  No    Allergies:  Allergies  Allergen Reactions  . Contrast Media [Iodinated Diagnostic Agents] Nausea And Vomiting  . Gardasil [Hpv Vaccine Recombinant (Yeast Derived)]   . Peanut Oil Swelling    Tongue swells/itching  . Pumpkin Flavor Swelling  . Hpv Bival (Type 16,18) Recomb Vaccine  [Human Papillomavirus (16,18) Recomb Vac] Hives  . Lactose Intolerance (Gi)     Medications Prior to Admission  Medication Sig Dispense Refill Last Dose  . butalbital-acetaminophen-caffeine (FIORICET, ESGIC) 50-325-40 MG tablet Take by mouth every 6 (six) hours as needed for headache.   More than a month at Unknown time  . Prenatal Vit-Fe Fumarate-FA (MULTIVITAMIN-PRENATAL) 27-0.8 MG TABS tablet Take 1 tablet by mouth daily at 12 noon.   07/13/2018 at Unknown time  . propranolol (INDERAL) 10 MG tablet Take 10 mg by mouth daily.   07/13/2018 at Unknown time    Review of Systems  Constitutional:       Hypertension  Respiratory: Negative.    Cardiovascular: Negative.   Gastrointestinal: Positive for abdominal pain and nausea. Negative for constipation, diarrhea and vomiting.  Genitourinary: Negative.   Musculoskeletal: Negative.   Neurological: Positive for headaches. Negative for dizziness, syncope and light-headedness.   Physical Exam   07/17/18 0448  -  96  -  -  124/93Abnormal   -  -  -  - CP   07/17/18 0444  -  100  -  -  106/76  -  -  -  - CP   07/17/18 0440  -  101Abnormal   -  -  98/71  -  -  -  - CP   07/17/18 0416  -  95  -  -  130/77  -  -  -  - CP   07/17/18 0401  -  103Abnormal   -  -  116/72  -  -  -  - CP   07/17/18 0346  -  98  -  -  134/85  -  -  -  - CP   07/17/18 0336  -  98  -  -  126/83  -  -  -  - CP   07/17/18 0316  -  100  -  -  130/92Abnormal   -  100 %  -  - CP   07/17/18 0310  -  -  -  -  -  -  100 %  -  - CP   07/17/18 0305  -  -  -  -  -  -  100 %  -  - CP   07/17/18 0301  -  100  -  -  127/89  -  -  -  - CP   07/17/18 0300  -  -  -  -  -  -  100 %  -  - CP   07/17/18 0255  -  -  -  -  -  -  100 %  -  - CP   07/17/18 0250  -  -  -  -  -  -  100 %  -  - CP   07/17/18 0246  -  100  -  -  116/86  -  99 %  -  - CP   07/17/18 0231  -  102Abnormal   -  -  134/77  -  -  -  - CP   07/17/18 0219  98.8 F (37.1  C)  111Abnormal   -  20  135/93Abnormal   Sitting  -  -  - CP    Physical Exam  Nursing note and vitals reviewed. Constitutional: She is oriented to person, place, and time. She appears well-developed and well-nourished. No distress.  Cardiovascular: Normal rate, regular rhythm and normal heart sounds.  Respiratory: Effort normal and breath sounds normal. No respiratory distress. She has no wheezes.  GI: There is no tenderness. There is no guarding.  Gravid, mild contractions palpated   Musculoskeletal: Normal range of motion. She exhibits edema.  Neurological: She is alert and oriented to person, place, and time. She displays normal reflexes. She exhibits normal muscle tone.  Psychiatric:  She has a normal mood and affect. Her behavior is normal. Thought content normal.   FHR: 135/Moderate/+accels/ no decelerations  Toco: 3-5 minutes/ mild by palpation  MAU Course  Procedures  MDM Orders Placed This Encounter  Procedures  . Urinalysis, Routine w reflex microscopic  . CBC  . Comprehensive metabolic panel  . Protein / creatinine ratio, urine   Results for orders placed or performed during the hospital encounter of 07/17/18 (from the past 24 hour(s))  Urinalysis, Routine w reflex microscopic     Status: Abnormal   Collection Time: 07/17/18  2:46 AM  Result Value Ref Range   Color, Urine YELLOW YELLOW   APPearance HAZY (A) CLEAR   Specific Gravity, Urine 1.009 1.005 - 1.030   pH 6.0 5.0 - 8.0   Glucose, UA NEGATIVE NEGATIVE mg/dL   Hgb urine dipstick NEGATIVE NEGATIVE   Bilirubin Urine NEGATIVE NEGATIVE   Ketones, ur NEGATIVE NEGATIVE mg/dL   Protein, ur NEGATIVE NEGATIVE mg/dL   Nitrite NEGATIVE NEGATIVE   Leukocytes, UA MODERATE (A) NEGATIVE   RBC / HPF 0-5 0 - 5 RBC/hpf   WBC, UA 6-10 0 - 5 WBC/hpf   Bacteria, UA MANY (A) NONE SEEN   Squamous Epithelial / LPF 0-5 0 - 5  Protein / creatinine ratio, urine     Status: None   Collection Time: 07/17/18  2:47 AM  Result Value Ref Range   Creatinine, Urine 69.00 mg/dL   Total Protein, Urine 8 mg/dL   Protein Creatinine Ratio 0.12 0.00 - 0.15 mg/mg[Cre]  CBC     Status: Abnormal   Collection Time: 07/17/18  2:55 AM  Result Value Ref Range   WBC 11.0 (H) 4.0 - 10.5 K/uL   RBC 3.98 3.87 - 5.11 MIL/uL   Hemoglobin 11.5 (L) 12.0 - 15.0 g/dL   HCT 16.1 (L) 09.6 - 04.5 %   MCV 86.4 80.0 - 100.0 fL   MCH 28.9 26.0 - 34.0 pg   MCHC 33.4 30.0 - 36.0 g/dL   RDW 40.9 81.1 - 91.4 %   Platelets 217 150 - 400 K/uL  Comprehensive metabolic panel     Status: Abnormal   Collection Time: 07/17/18  2:55 AM  Result Value Ref Range   Sodium 136 135 - 145 mmol/L   Potassium 3.5 3.5 - 5.1 mmol/L   Chloride 107 98 - 111 mmol/L    CO2 20 (L) 22 - 32 mmol/L   Glucose, Bld 117 (H) 70 - 99 mg/dL   BUN 6 6 - 20 mg/dL   Creatinine, Ser 7.82 0.44 - 1.00 mg/dL   Calcium 9.1 8.9 - 95.6 mg/dL   Total Protein 5.9 (L) 6.5 - 8.1 g/dL   Albumin 2.8 (L) 3.5 - 5.0 g/dL   AST 20 15 - 41  U/L   ALT 16 0 - 44 U/L   Alkaline Phosphatase 122 38 - 126 U/L   Total Bilirubin 0.6 0.3 - 1.2 mg/dL   GFR calc non Af Amer >60 >60 mL/min   GFR calc Af Amer >60 >60 mL/min   Anion gap 9 5 - 15   PEC labs WNL  Treatments in MAU included Fioricet 1 tablet for HA, patient reports HA is relieved with medication   Consult with Dr Alysia Penna with assessment. Diagnosed with Gestational Hypertension based on elevated BP of 132/91 and 143/91 on 11/1 then elevated BP of 135/95 and 135/93 today. Recommends admission for delivery today.   Called Dr Dion Body to discuss recommendations- phone straight to voicemail, [facility] to discuss recommendations- will come to MAU and take over care, plans for admission and delivery.   Assessment and Plan  1. Gestational Hypertension  2. [redacted] weeks gestation   Recommends admission and delivery for [facility] Care taken over by [facility] NP,CNM @0500   Sharyon Cable CNM  07/17/2018, 5:17 AM

## 2018-07-17 NOTE — Anesthesia Pain Management Evaluation Note (Signed)
  CRNA Pain Management Visit Note  Patient: Susan Krause, 27 y.o., female  "Hello I am a member of the anesthesia team at [facility]. We have an anesthesia team available at all times to provide care throughout the hospital, including epidural management and anesthesia for C-section. I don't know your plan for the delivery whether it a natural birth, water birth, IV sedation, nitrous supplementation, doula or epidural, but we want to meet your pain goals."   1.Was your pain managed to your expectations on prior hospitalizations?   No prior hospitalizations  2.What is your expectation for pain management during this hospitalization?     Epidural  3.How can we help you reach that goal? epidural  Record the patient's initial score and the patient's pain goal.   Pain: 6  Pain Goal: 7 [facility] wants you to be able to say your pain was always managed very well.  Alissa Pharr 07/17/2018

## 2018-07-17 NOTE — Anesthesia Preprocedure Evaluation (Signed)
Anesthesia Evaluation  Patient identified by MRN, date of birth, ID band Patient awake    Reviewed: Allergy & Precautions, Patient's Chart, lab work & pertinent test results  Airway Mallampati: II  TM Distance: >3 FB Neck ROM: Full    Dental no notable dental hx. (+) Teeth Intact   Pulmonary neg pulmonary ROS,    Pulmonary exam normal breath sounds clear to auscultation       Cardiovascular negative cardio ROS Normal cardiovascular exam Rhythm:Regular Rate:Normal     Neuro/Psych  Headaches, PSYCHIATRIC DISORDERS Anxiety    GI/Hepatic negative GI ROS, Neg liver ROS,   Endo/Other  Morbid obesity  Renal/GU negative Renal ROS  negative genitourinary   Musculoskeletal negative musculoskeletal ROS (+)   Abdominal (+) + obese,   Peds  Hematology  (+) anemia ,   Anesthesia Other Findings   Reproductive/Obstetrics (+) Pregnancy                             Anesthesia Physical Anesthesia Plan  ASA: III  Anesthesia Plan: Epidural   Post-op Pain Management:    Induction:   PONV Risk Score and Plan:   Airway Management Planned: Natural Airway  Additional Equipment:   Intra-op Plan:   Post-operative Plan:   Informed Consent: I have reviewed the patients History and Physical, chart, labs and discussed the procedure including the risks, benefits and alternatives for the proposed anesthesia with the patient or authorized representative who has indicated his/her understanding and acceptance.     Plan Discussed with: Anesthesiologist  Anesthesia Plan Comments:         Anesthesia Quick Evaluation

## 2018-07-17 NOTE — Progress Notes (Signed)
Subjective: Pt comfortable with epidural. Husband in room  Objective: BP 123/68   Pulse 82   Temp 97.7 F (36.5 C) (Oral)   Resp 18   Ht 5\' 2"  (1.575 m)   Wt 107.9 kg   LMP 10/19/2017   SpO2 100%   BMI 43.51 kg/m  I/O last 3 completed shifts: In: 222 [P.O.:222] Out: -  No intake/output data recorded.  FHT: Category 1 FHT 125 accels noted, no decels UC:   regular, every 2-6 minutes SVE:   Dilation: 3 Effacement (%): 50 Station: -2 Exam by:: Rennis Harding Greer,CNM  Pitocin at start   Assessment:  Pt is G1 at 38.5 IUP induction for GHTN Cat 1 strip  Plan: Start pitocin Consider AROM and IUPC  Kenney Houseman CNM, MSN 07/17/2018, 8:00 PM

## 2018-07-17 NOTE — Progress Notes (Signed)
Susan Krause is a 27 y.o. G1P0 at [redacted]w[redacted]d admitted for induction of labor due to Hypertension.  Subjective: Patient recently admitted to L&D. She's complaining of mild, irregular contractions since 11/1. She states they are getting stronger. Discussed process of induction with patient and why we are starting with Cytotec in her case. Will reassess regularly to guide induction process.   Objective: BP (!) 91/51   Pulse 89   Temp 98 F (36.7 C) (Oral)   Resp 16   LMP 10/19/2017   SpO2 100%  I/O last 3 completed shifts: In: 222 [P.O.:222] Out: -  No intake/output data recorded.  FHT:  FHR: 120 bpm, variability: moderate,  accelerations:  Present,  decelerations:  Absent UC:   irregular, every 1-8 minutes SVE:   Dilation: 2 Effacement (%): 50 Station: -2 Exam by:: CGoodnight,RNC   Labs: Lab Results  Component Value Date   WBC 11.0 (H) 07/17/2018   HGB 11.5 (L) 07/17/2018   HCT 34.4 (L) 07/17/2018   MCV 86.4 07/17/2018   PLT 217 07/17/2018    Assessment / Plan: Induction of labor due to gestational hypertension, progressing on cytotec  Labor: Progressing normally Preeclampsia:  no signs or symptoms of toxicity, intake and ouput balanced and labs stable Fetal Wellbeing:  Category I Pain Control:  Labor support without medications I/D:  n/a Anticipated MOD:  NSVD  [name] 07/17/2018, 10:21 AM

## 2018-07-17 NOTE — Progress Notes (Signed)
Subjective: Pt comfortable with epidural.  States anxiety is same as usual. Objective: BP (!) 98/52   Pulse 88   Temp 97.7 F (36.5 C) (Oral)   Resp 16   Ht 5\' 2"  (1.575 m)   Wt 107.9 kg   LMP 10/19/2017   SpO2 100%   BMI 43.51 kg/m  I/O last 3 completed shifts: In: 222 [P.O.:222] Out: -  Total I/O In: -  Out: 1000 [Urine:1000]  FHT: Category 1 FHT 125 accels no decels UC:   regular, every 2-3 minutes SVE:   Dilation: 4 Effacement (%): 80 Station: -2 Exam by:: Bernerd Pho, CNM Pitocin at  AROM clear fluid with IUPC placed  Assessment:  Pt is G1 at 38.5 IUP induction for GHTN Cat 1 strip   Plan: Monitor progress Vistaril if needed 50 mg po  Kenney Houseman CNM, MSN 07/17/2018, 10:31 PM

## 2018-07-17 NOTE — Progress Notes (Signed)
Susan Krause is a 27 y.o. G1P0 at [redacted]w[redacted]d admitted for induction of labor due to Hypertension.  Subjective: Patient having moderately strong and regular contractions. She is using birthing ball and moving around in room. Patient is contracting frequently and does not require further Cytotec at this time.   Objective: BP 124/83   Pulse 98   Temp 97.9 F (36.6 C) (Oral)   Resp 20   Ht 5\' 2"  (1.575 m)   Wt 107.9 kg   LMP 10/19/2017   SpO2 100%   BMI 43.51 kg/m  I/O last 3 completed shifts: In: 222 [P.O.:222] Out: -  No intake/output data recorded.  FHT:  FHR: 130 bpm, variability: moderate,  accelerations:  Present,  decelerations:  Absent UC:   Regular every 1.5-43min  SVE:   Dilation: 3 Effacement (%): 50 Station: -2 Exam by:: [employer] Quentina Fronek,CNM   Labs: Lab Results  Component Value Date   WBC 11.0 (H) 07/17/2018   HGB 11.5 (L) 07/17/2018   HCT 34.4 (L) 07/17/2018   MCV 86.4 07/17/2018   PLT 217 07/17/2018    Assessment / Plan: Induction of labor due to gestational hypertension, progressing on cytotec  Labor: Progressing normally Preeclampsia:  no signs or symptoms of toxicity, intake and ouput balanced and labs stable Fetal Wellbeing:  Category I Pain Control:  Labor support without medications I/D:  n/a Anticipated MOD:  NSVD  [name] 07/17/2018, 12:58 PM
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
## 2018-07-18 ENCOUNTER — Encounter (HOSPITAL_COMMUNITY): Payer: Self-pay | Admitting: *Deleted

## 2018-07-18 ENCOUNTER — Encounter (HOSPITAL_COMMUNITY)
Admission: AD | Disposition: A | Discharge status: Home / Self Care | Payer: Self-pay | Source: Home / Self Care | Attending: Obstetrics & Gynecology

## 2018-07-18 LAB — CBC
HEMATOCRIT: 35.8 % — AB (ref 36.0–46.0)
HEMOGLOBIN: 12 g/dL (ref 12.0–15.0)
MCH: 28.8 pg (ref 26.0–34.0)
MCHC: 33.5 g/dL (ref 30.0–36.0)
MCV: 86.1 fL (ref 80.0–100.0)
Platelets: 178 10*3/uL (ref 150–400)
RBC: 4.16 MIL/uL (ref 3.87–5.11)
RDW: 13.9 % (ref 11.5–15.5)
WBC: 22.5 10*3/uL — ABNORMAL HIGH (ref 4.0–10.5)
nRBC: 0 % (ref 0.0–0.2)

## 2018-07-18 SURGERY — Surgical Case
Anesthesia: Epidural | Site: Abdomen | Wound class: Clean Contaminated

## 2018-07-18 MED ORDER — SIMETHICONE 80 MG PO CHEW: 80.0000 mg | CHEWABLE_TABLET | ORAL | Status: DC | PRN

## 2018-07-18 MED ORDER — OXYTOCIN 10 UNIT/ML IJ SOLN
INTRAVENOUS | Status: DC | PRN
  Administered 2018-07-18: 40 [IU] via INTRAVENOUS

## 2018-07-18 MED ORDER — SODIUM CHLORIDE 0.9 % IV SOLN
3.0000 g | Freq: Once | INTRAVENOUS | Status: AC
  Administered 2018-07-18: 3 g via INTRAVENOUS
  Filled 2018-07-18: qty 3

## 2018-07-18 MED ORDER — WITCH HAZEL-GLYCERIN EX PADS: 1.0000 "application " | MEDICATED_PAD | CUTANEOUS | Status: DC | PRN

## 2018-07-18 MED ORDER — OXYTOCIN 10 UNIT/ML IJ SOLN
INTRAMUSCULAR | Status: AC
  Filled 2018-07-18: qty 4

## 2018-07-18 MED ORDER — DIPHENHYDRAMINE HCL 25 MG PO CAPS: 25.0000 mg | ORAL_CAPSULE | Freq: Four times a day (QID) | ORAL | Status: DC | PRN

## 2018-07-18 MED ORDER — DIBUCAINE 1 % RE OINT: 1.0000 "application " | TOPICAL_OINTMENT | RECTAL | Status: DC | PRN

## 2018-07-18 MED ORDER — NALOXONE HCL 0.4 MG/ML IJ SOLN: 0.4000 mg | INTRAMUSCULAR | Status: DC | PRN

## 2018-07-18 MED ORDER — SODIUM CHLORIDE 0.9% FLUSH: 3.0000 mL | INTRAVENOUS | Status: DC | PRN

## 2018-07-18 MED ORDER — COCONUT OIL OIL
1.0000 "application " | TOPICAL_OIL | Status: DC | PRN
  Administered 2018-07-20: 1 via TOPICAL
  Filled 2018-07-18: qty 120

## 2018-07-18 MED ORDER — SCOPOLAMINE 1 MG/3DAYS TD PT72
MEDICATED_PATCH | TRANSDERMAL | Status: AC
  Filled 2018-07-18: qty 1

## 2018-07-18 MED ORDER — LIDOCAINE-EPINEPHRINE (PF) 2 %-1:200000 IJ SOLN
INTRAMUSCULAR | Status: AC
  Filled 2018-07-18: qty 20

## 2018-07-18 MED ORDER — ACETAMINOPHEN 10 MG/ML IV SOLN: 1000.0000 mg | Freq: Once | INTRAVENOUS | Status: DC | PRN

## 2018-07-18 MED ORDER — TETANUS-DIPHTH-ACELL PERTUSSIS 5-2.5-18.5 LF-MCG/0.5 IM SUSP: 0.5000 mL | Freq: Once | INTRAMUSCULAR | Status: DC

## 2018-07-18 MED ORDER — SIMETHICONE 80 MG PO CHEW
80.0000 mg | CHEWABLE_TABLET | Freq: Three times a day (TID) | ORAL | Status: DC
  Administered 2018-07-19 – 2018-07-21 (×7): 80 mg via ORAL
  Filled 2018-07-18 (×7): qty 1

## 2018-07-18 MED ORDER — SCOPOLAMINE 1 MG/3DAYS TD PT72: 1.0000 | MEDICATED_PATCH | Freq: Once | TRANSDERMAL | Status: DC

## 2018-07-18 MED ORDER — BUPIVACAINE HCL 0.25 % IJ SOLN
INTRAMUSCULAR | Status: DC | PRN
  Administered 2018-07-18: 30 mL

## 2018-07-18 MED ORDER — ACETAMINOPHEN 325 MG PO TABS
650.0000 mg | ORAL_TABLET | ORAL | Status: DC | PRN
  Administered 2018-07-21: 650 mg via ORAL
  Filled 2018-07-18: qty 2

## 2018-07-18 MED ORDER — BUPIVACAINE HCL (PF) 0.25 % IJ SOLN
INTRAMUSCULAR | Status: AC
  Filled 2018-07-18: qty 30

## 2018-07-18 MED ORDER — OXYCODONE-ACETAMINOPHEN 5-325 MG PO TABS
1.0000 | ORAL_TABLET | ORAL | Status: DC | PRN
  Administered 2018-07-19 – 2018-07-20 (×3): 1 via ORAL
  Filled 2018-07-18 (×4): qty 1

## 2018-07-18 MED ORDER — SIMETHICONE 80 MG PO CHEW
80.0000 mg | CHEWABLE_TABLET | ORAL | Status: DC
  Administered 2018-07-19 – 2018-07-20 (×3): 80 mg via ORAL
  Filled 2018-07-18 (×3): qty 1

## 2018-07-18 MED ORDER — ONDANSETRON HCL 4 MG/2ML IJ SOLN
INTRAMUSCULAR | Status: AC
  Filled 2018-07-18: qty 2

## 2018-07-18 MED ORDER — MENTHOL 3 MG MT LOZG: 1.0000 | LOZENGE | OROMUCOSAL | Status: DC | PRN

## 2018-07-18 MED ORDER — FENTANYL CITRATE (PF) 100 MCG/2ML IJ SOLN
INTRAMUSCULAR | Status: AC
  Filled 2018-07-18: qty 2

## 2018-07-18 MED ORDER — NALOXONE HCL 4 MG/10ML IJ SOLN: 1.0000 ug/kg/h | INTRAVENOUS | Status: DC | PRN

## 2018-07-18 MED ORDER — SCOPOLAMINE 1 MG/3DAYS TD PT72
MEDICATED_PATCH | TRANSDERMAL | Status: DC | PRN
  Administered 2018-07-18: 1 via TRANSDERMAL

## 2018-07-18 MED ORDER — KETOROLAC TROMETHAMINE 30 MG/ML IJ SOLN
30.0000 mg | Freq: Once | INTRAMUSCULAR | Status: AC
  Administered 2018-07-18: 30 mg via INTRAVENOUS
  Filled 2018-07-18: qty 1

## 2018-07-18 MED ORDER — MORPHINE SULFATE (PF) 0.5 MG/ML IJ SOLN
INTRAMUSCULAR | Status: DC | PRN
  Administered 2018-07-18: 3 mg via EPIDURAL

## 2018-07-18 MED ORDER — OXYTOCIN 40 UNITS IN LACTATED RINGERS INFUSION - SIMPLE MED: 2.5000 [IU]/h | INTRAVENOUS | Status: AC

## 2018-07-18 MED ORDER — PRENATAL MULTIVITAMIN CH
1.0000 | ORAL_TABLET | Freq: Every day | ORAL | Status: DC
  Administered 2018-07-19 – 2018-07-21 (×3): 1 via ORAL
  Filled 2018-07-18 (×3): qty 1

## 2018-07-18 MED ORDER — LACTATED RINGERS IV SOLN
INTRAVENOUS | Status: DC
  Administered 2018-07-18 – 2018-07-19 (×2): via INTRAVENOUS

## 2018-07-18 MED ORDER — DIPHENHYDRAMINE HCL 25 MG PO CAPS: 25.0000 mg | ORAL_CAPSULE | ORAL | Status: DC | PRN

## 2018-07-18 MED ORDER — NALBUPHINE HCL 10 MG/ML IJ SOLN: 5.0000 mg | Freq: Once | INTRAMUSCULAR | Status: DC | PRN

## 2018-07-18 MED ORDER — DEXAMETHASONE SODIUM PHOSPHATE 4 MG/ML IJ SOLN
INTRAMUSCULAR | Status: AC
  Filled 2018-07-18: qty 1

## 2018-07-18 MED ORDER — LACTATED RINGERS IV SOLN
INTRAVENOUS | Status: DC | PRN
  Administered 2018-07-18 (×3): via INTRAVENOUS

## 2018-07-18 MED ORDER — SOD CITRATE-CITRIC ACID 500-334 MG/5ML PO SOLN
30.0000 mL | ORAL | Status: AC
  Administered 2018-07-18: 30 mL via ORAL

## 2018-07-18 MED ORDER — NALBUPHINE HCL 10 MG/ML IJ SOLN: 5.0000 mg | INTRAMUSCULAR | Status: DC | PRN

## 2018-07-18 MED ORDER — ONDANSETRON HCL 4 MG/2ML IJ SOLN: 4.0000 mg | Freq: Three times a day (TID) | INTRAMUSCULAR | Status: DC | PRN

## 2018-07-18 MED ORDER — ZOLPIDEM TARTRATE 5 MG PO TABS: 5.0000 mg | ORAL_TABLET | Freq: Every evening | ORAL | Status: DC | PRN

## 2018-07-18 MED ORDER — MEPERIDINE HCL 25 MG/ML IJ SOLN
INTRAMUSCULAR | Status: AC
  Filled 2018-07-18: qty 1

## 2018-07-18 MED ORDER — SODIUM BICARBONATE 8.4 % IV SOLN
INTRAVENOUS | Status: DC | PRN
  Administered 2018-07-18: 5 mL via EPIDURAL
  Administered 2018-07-18: 3 mL via EPIDURAL
  Administered 2018-07-18: 5 mL via EPIDURAL

## 2018-07-18 MED ORDER — MEPERIDINE HCL 25 MG/ML IJ SOLN
INTRAMUSCULAR | Status: DC | PRN
  Administered 2018-07-18 (×2): 12.5 mg via INTRAVENOUS

## 2018-07-18 MED ORDER — OXYCODONE-ACETAMINOPHEN 5-325 MG PO TABS: 2.0000 | ORAL_TABLET | ORAL | Status: DC | PRN

## 2018-07-18 MED ORDER — DEXAMETHASONE SODIUM PHOSPHATE 4 MG/ML IJ SOLN
INTRAMUSCULAR | Status: DC | PRN
  Administered 2018-07-18: 4 mg via INTRAVENOUS

## 2018-07-18 MED ORDER — ONDANSETRON HCL 4 MG/2ML IJ SOLN
INTRAMUSCULAR | Status: DC | PRN
  Administered 2018-07-18: 4 mg via INTRAVENOUS

## 2018-07-18 MED ORDER — SENNOSIDES-DOCUSATE SODIUM 8.6-50 MG PO TABS
2.0000 | ORAL_TABLET | ORAL | Status: DC
  Administered 2018-07-19 – 2018-07-20 (×3): 2 via ORAL
  Filled 2018-07-18 (×3): qty 2

## 2018-07-18 MED ORDER — DIPHENHYDRAMINE HCL 50 MG/ML IJ SOLN: 12.5000 mg | INTRAMUSCULAR | Status: DC | PRN

## 2018-07-18 MED ORDER — IBUPROFEN 600 MG PO TABS
600.0000 mg | ORAL_TABLET | Freq: Four times a day (QID) | ORAL | Status: DC
  Administered 2018-07-19 – 2018-07-21 (×11): 600 mg via ORAL
  Filled 2018-07-18 (×11): qty 1

## 2018-07-18 MED ORDER — MORPHINE SULFATE (PF) 0.5 MG/ML IJ SOLN
INTRAMUSCULAR | Status: AC
  Filled 2018-07-18: qty 10

## 2018-07-18 SURGICAL SUPPLY — 39 items
BENZOIN TINCTURE PRP APPL 2/3 (GAUZE/BANDAGES/DRESSINGS) ×3 IMPLANT
CHLORAPREP W/TINT 26ML (MISCELLANEOUS) ×3 IMPLANT
CLAMP CORD UMBIL (MISCELLANEOUS) IMPLANT
CLOSURE WOUND 1/2 X4 (GAUZE/BANDAGES/DRESSINGS) ×1
CLOTH BEACON ORANGE TIMEOUT ST (SAFETY) ×3 IMPLANT
DRSG OPSITE POSTOP 4X10 (GAUZE/BANDAGES/DRESSINGS) ×3 IMPLANT
ELECT REM PT RETURN 9FT ADLT (ELECTROSURGICAL) ×3
ELECTRODE REM PT RTRN 9FT ADLT (ELECTROSURGICAL) ×1 IMPLANT
EXTRACTOR VACUUM BELL STYLE (SUCTIONS) IMPLANT
EXTRACTOR VACUUM KIWI (MISCELLANEOUS) IMPLANT
GLOVE BIO SURGEON STRL SZ 6.5 (GLOVE) ×2 IMPLANT
GLOVE BIO SURGEONS STRL SZ 6.5 (GLOVE) ×1
GLOVE BIOGEL PI IND STRL 7.0 (GLOVE) ×2 IMPLANT
GLOVE BIOGEL PI INDICATOR 7.0 (GLOVE) ×4
GOWN STRL REUS W/TWL LRG LVL3 (GOWN DISPOSABLE) ×9 IMPLANT
KIT ABG SYR 3ML LUER SLIP (SYRINGE) IMPLANT
NEEDLE HYPO 22GX1.5 SAFETY (NEEDLE) IMPLANT
NEEDLE HYPO 25X5/8 SAFETYGLIDE (NEEDLE) IMPLANT
NS IRRIG 1000ML POUR BTL (IV SOLUTION) ×3 IMPLANT
PACK C SECTION WH (CUSTOM PROCEDURE TRAY) ×3 IMPLANT
PAD ABD 8X10 STRL (GAUZE/BANDAGES/DRESSINGS) ×3 IMPLANT
PAD OB MATERNITY 4.3X12.25 (PERSONAL CARE ITEMS) ×3 IMPLANT
PENCIL SMOKE EVAC W/HOLSTER (ELECTROSURGICAL) ×3 IMPLANT
RTRCTR C-SECT PINK 25CM LRG (MISCELLANEOUS) ×3 IMPLANT
SPONGE GAUZE 4X4 12PLY (GAUZE/BANDAGES/DRESSINGS) ×3 IMPLANT
SPONGE LAP 18X18 RF (DISPOSABLE) ×9 IMPLANT
STRIP CLOSURE SKIN 1/2X4 (GAUZE/BANDAGES/DRESSINGS) ×2 IMPLANT
SUT CHROMIC 2 0 CT 1 (SUTURE) ×6 IMPLANT
SUT MNCRL 0 VIOLET CTX 36 (SUTURE) ×2 IMPLANT
SUT MONOCRYL 0 CTX 36 (SUTURE) ×4
SUT PDS AB 0 CTX 36 PDP370T (SUTURE) IMPLANT
SUT PLAIN 2 0 (SUTURE)
SUT PLAIN ABS 2-0 CT1 27XMFL (SUTURE) IMPLANT
SUT VIC AB 0 CTX 36 (SUTURE) ×4
SUT VIC AB 0 CTX36XBRD ANBCTRL (SUTURE) ×2 IMPLANT
SUT VIC AB 4-0 KS 27 (SUTURE) ×3 IMPLANT
SYR CONTROL 10ML LL (SYRINGE) IMPLANT
TOWEL OR 17X24 6PK STRL BLUE (TOWEL DISPOSABLE) ×3 IMPLANT
TRAY FOLEY W/BAG SLVR 14FR LF (SET/KITS/TRAYS/PACK) ×3 IMPLANT

## 2018-07-18 NOTE — Lactation Note (Addendum)
This note was copied from a baby's chart. Lactation Consultation Note Baby 7 hrs old.  Mom concerned baby is sleepy. Not interested in BF at this time. Mom has done STS today. Encouraged to cont. To stimulate to feed. FOB holding baby STS at this time. For safety encouraged FOB to place baby in crib when FOB is sleeping. Mom has everted nipples when stimulated well and hardens. Short shaft when soft. Shells given to wear in am d/t c/s mom w/generalized edema. Hand pump given to pre-pump before feeding to erect nipples for a deeper latch. Hand expression w/9 ml colostrum collected. Praised mom excited.  Encouraged mom to give baby colostrum by spoon to stimulate to feed. Newborn behavior, benefits of STS, I&O, supply and demand discussed. Answered mom's questions for that time. Mom encouraged to feed baby 8-12 times/24 hours and with feeding cues. Mom plans on exclusively BF.  Baby has some bruising. Discussed possibilities of getting jaundice.  Mom verbalized teach back. Encouraged to call for assistance or questions.   [facility] brochure given w/resources, support groups and LC services.  Patient Name: Susan Krause [id number] Date: 07/18/2018 Reason for consult: Initial assessment;Early term 37-38.6wks;1st time breastfeeding   Maternal Data    Feeding Feeding Type: Breast Fed  LATCH Score Latch: Too sleepy or reluctant, no latch achieved, no sucking elicited.  Audible Swallowing: None  Type of Nipple: Everted at rest and after stimulation(compresses to shorter shaft)  Comfort (Breast/Nipple): Soft / non-tender  Hold (Positioning): Full assist, staff holds infant at breast  LATCH Score: 4  Interventions Interventions: Breast feeding basics reviewed;Support pillows;Position options;Expressed milk;Breast massage;Hand express;Shells;Pre-pump if needed;Hand pump;Breast compression  Lactation Tools Discussed/Used Tools: Shells;Pump(to wear shells in am) Shell Type: Inverted Breast pump  type: Manual Pump Review: Setup, frequency, and cleaning;Milk Storage Initiated by:: Peri Jefferson RN IBCLC Date initiated:: 07/18/18   Consult Status Consult Status: Follow-up Date: 07/19/18 Follow-up type: In-patient    Charyl Dancer 07/18/2018, 9:45 PM

## 2018-07-18 NOTE — Progress Notes (Signed)
Subjective:  Called by RN to evaluate strip.  Pt comfortable with epidural Objective: BP 111/76   Pulse 95   Temp 98.5 F (36.9 C) (Axillary)   Resp 16   Ht 5\' 2"  (1.575 m)   Wt 107.9 kg   LMP 10/19/2017   SpO2 100%   BMI 43.51 kg/m  I/O last 3 completed shifts: In: 222 [P.O.:222] Out: -  Total I/O In: 2672 [P.O.:240; I.V.:2312.9; Other:119] Out: 2200 [Urine:2200]  FHT: Category FHT FHT 140 aceels noted, occ variable and early decels noted UC:   regular, every 2-4 minutes SVE:   Dilation: 5 Effacement (%): 80 Station: -1 Exam by:: L.Stubbs, RN Pitocin at 10 mu MVUs 180-250  Assessment Pt is G1 at 38.5 IUP induction for GHTN Cat 1 strip  Plan: Consider amnioinfusion if needed Monitor strip and progress  Kenney Houseman CNM, MSN 07/18/2018, 4:53 AM

## 2018-07-18 NOTE — Progress Notes (Signed)
Subjective: Pt resting, no complaints  Objective: BP 124/65   Pulse 98   Temp 98.5 F (36.9 C) (Axillary)   Resp 16   Ht 5\' 2"  (1.575 m)   Wt 107.9 kg   LMP 10/19/2017   SpO2 100%   BMI 43.51 kg/m  I/O last 3 completed shifts: In: 222 [P.O.:222] Out: -  Total I/O In: 2672 [P.O.:240; I.V.:2312.9; Other:119] Out: 3600 [Urine:3600]  FHT: Category 1 FHT 130 accels, occ variable UC:   regular, every  minutes SVE:   Dilation: 5 Effacement (%): 80 Station: -1 Exam by:: L.Stubbs, RN Pitocin at 12 mu MVUs 230  Assessment:  Pt is G1 at 38.5 IUP induction for GHTN Cat 1 strip  Plan: Monitor progress  Susan Krause CNM, MSN 07/18/2018, 6:32 AM

## 2018-07-18 NOTE — Anesthesia Postprocedure Evaluation (Signed)
Anesthesia Post Note  Patient: [name]  Procedure(s) Performed: CESAREAN SECTION (N/A Abdomen)     Patient location during evaluation: Mother Baby Anesthesia Type: Epidural Level of consciousness: awake and alert Pain management: pain level controlled Vital Signs Assessment: post-procedure vital signs reviewed and stable Respiratory status: spontaneous breathing, nonlabored ventilation and respiratory function stable Cardiovascular status: stable Postop Assessment: no headache, no backache and epidural receding Anesthetic complications: no    Last Vitals:  Vitals:   07/18/18 1720 07/18/18 1817  BP: 127/82 131/80  Pulse: 84 89  Resp: 20 20  Temp: 37.3 C 36.8 C  SpO2: 99% 99%    Last Pain:  Vitals:   07/18/18 1817  TempSrc: Oral  PainSc: 5                  Overton Boggus

## 2018-07-18 NOTE — Transfer of Care (Signed)
Immediate Anesthesia Transfer of Care Note  Patient: [name]  Procedure(s) Performed: CESAREAN SECTION (N/A Abdomen)  Patient Location: PACU  Anesthesia Type:Epidural  Level of Consciousness: awake, alert  and patient cooperative  Airway & Oxygen Therapy: Patient Spontanous Breathing  Post-op Assessment: Report given to RN and Post -op Vital signs reviewed and stable  Post vital signs: Reviewed and stable  Last Vitals:  Vitals Value Taken Time  BP    Temp    Pulse 98 07/18/2018  3:33 PM  Resp 27 07/18/2018  3:33 PM  SpO2 100 % 07/18/2018  3:33 PM  Vitals shown include unvalidated device data.  Last Pain:  Vitals:   07/18/18 1401  TempSrc:   PainSc: 6       Patients Stated Pain Goal: 3 (07/18/18 0731)  Complications: No apparent anesthesia complications

## 2018-07-18 NOTE — Op Note (Signed)
Cesarean Section Procedure Note  Susan Krause  DOB:    07/02/1991  MRN:    [id number]  Date of Surgery:  07/18/2018  Indication: 38 week 6 day [facility] admitted for induction of labor for gestational hypertension now being taken back to operating room for arrest of decent and maternal exhaustion after pushing for 3.5 hours.    Pre-operative Diagnosis: Arrest of decent  Post-operative Diagnosis: same  Procedure:  Primary cesarean delivery                         Surgeon: Wynonia Hazard, MD  Assistants: None  Anesthesia: Epidural  anesthesia  ASA Class: 2  Procedure Details   The patient was counseled about the risks, benefits, complications of the cesarean section. The patient concurred with the proposed plan, giving informed consent.  The site of surgery properly noted/marked. The patient was taken to Operating Room # 1, identified as [facility] and the procedure verified as C-Section Delivery. A Time Out was held and the above information confirmed.  After epidural was found to adequate , the patient was placed in the dorsal supine position with a leftward tilt, draped and prepped in the usual sterile manner. A Pfannenstiel incision was made with a 10 blade scalpel and the incision carried down through the subcutaneous tissue to the fascia.  The fascia was incised in the midline and the fascial incision was extended laterally with Mayo scissors. The superior aspect of the fascial incision was grasped with Coker clamps x2, tented up and the rectus muscles dissected off sharply with the bovie.  The rectus was then dissected off with blunt dissection and the bovie inferiorly. The rectus muscles were separated in the midline. The abdominal peritoneum was identified, and bluntly entered using surgeons fingers. The peritoneal opening was bluntly extended with gentle pulling.  The Alexis retractor was then deployed. The vesicouterine peritoneum was identified, tented up, entered sharply with  Metzenbaum scissors, and the bladder flap was created digitally. Scalpel was then used to make a low transverse incision on the uterus which was extended laterally with  blunt dissection. The fetal vertex was identified and with the help of a vaginal hand from the RN, the infant was delivered from cephalic presentation.  A live healthy Female with Apgar scores of 8 at one minute and 8 at five minutes using the Kiwi vacuum. There was a loose nuchal cord.  After 1 minute of life,  the umbilical cord was clamped and cut, the baby was handed off to waiting Pediatricians. cord blood was obtained for evaluation. The placenta was spontaneously removed intact. The placenta was handed off to be sent to Labor and Delivery.  The uterus was cleared of all clot and debris. The uterine incision was repaired with #0 Monocryl in running locked fashion. A second imbricating suture was performed using the same suture. The incision was hemostatic. Ovaries and tubes were inspected and normal. The Alexis retractor was removed. The abdominal cavity was cleared of all clot and debris. The abdominal peritoneum was reapproximated with 2-0 chromic  in a running fashion, the rectus muscles was reapproximated with #2 chromic in interrupted fashion. The fascia was closed with 0 Vicryl in a running fashion starting from each end and meeting in the middle. The subcuticular layer was irrigated and all bleeders cauterized.  30 mL of 0.5% Marcaine was injected into the subcutaneous layer.  The Scarpas fascia was re-approximated with interrupted sutures of 2-0 plain.  The skin was closed with 4-0 vicryl in a subcuticular fashion using a Mellody Dance needle. The incision was dressed with benzoine, steri strips and pressure dressing. All sponge lap and needle counts were correct x3.   Patient tolerated the procedure well and recovered in stable condition following the procedure.  Instrument, sponge, and needle counts were correct prior the abdominal  closure and at the conclusion of the case.   Findings: Live female infant, Apgars 8/8, clear amniotic fluid, placenta normal 3 vessels, normal uterus, bilateral tubes and ovaries  Estimated Blood Loss:  IVF:  1800 mL LR         Drains: Foley catheter  Urine output: 350 mL clear         Specimens: Placenta to L&D         Implants: none         Complications:  None; patient tolerated the procedure well.         Disposition: PACU - hemodynamically stable.   Tejal Monroy STACIA

## 2018-07-18 NOTE — Progress Notes (Signed)
MD Progress Note / Pre operative Note  Susan Krause is a 27 y.o. G1P0 at [redacted]w[redacted]d by LMP admitted for induction of labor due to gestational hypertension.  Subjective: Patient uncomfortable with pressure  Objective: BP (!) 121/99   Pulse (!) 103   Temp 100.3 F (37.9 C) (Axillary)   Resp 18   Ht 5\' 2"  (1.575 m)   Wt 107.9 kg   LMP 10/19/2017   SpO2 100%   BMI 43.51 kg/m  I/O last 3 completed shifts: In: 2894 [P.O.:462; I.V.:2312.9; Other:119] Out: 3600 [Urine:3600] Total I/O In: -  Out: 200 [Urine:200]  FHT:  FHR: 150 bpm, variability: moderate,  accelerations:  Present,  decelerations:  Present early variable decelerations with contractions nadir 100's with immediate return to baseline UC:   regular, every 2-3 minutes SVE:   Dilation: 10 Effacement (%): 100 Station: 0, Plus 1(caput/moulding) Exam by:: Enis Slipper, RN  Labs: Lab Results  Component Value Date   WBC 11.0 (H) 07/17/2018   HGB 11.5 (L) 07/17/2018   HCT 34.4 (L) 07/17/2018   MCV 86.4 07/17/2018   PLT 217 07/17/2018    Assessment / Plan: Arrest of decent  Patient has pushed for 3 1/2 hours and now reports exhaustion.  Offered operative vaginal delivery with either vacuum or forceps however patient declines.  We discussed proceeding with a primary cesarean delivery for arrest of decent.   Risks of cesarean section discussed with patient to include hemorrhage, infection, injury to organs, vessels.  She voiced understanding and all questions answered.   Consent signed, witnessed and placed into chart.   Essie Hart STACIA 07/18/2018, 1:40 PM

## 2018-07-19 ENCOUNTER — Encounter (HOSPITAL_COMMUNITY): Payer: Self-pay | Admitting: Obstetrics & Gynecology

## 2018-07-19 LAB — CBC
HCT: 31.8 % — ABNORMAL LOW (ref 36.0–46.0)
Hemoglobin: 10.6 g/dL — ABNORMAL LOW (ref 12.0–15.0)
MCH: 29.1 pg (ref 26.0–34.0)
MCHC: 33.3 g/dL (ref 30.0–36.0)
MCV: 87.4 fL (ref 80.0–100.0)
NRBC: 0 % (ref 0.0–0.2)
PLATELETS: 184 10*3/uL (ref 150–400)
RBC: 3.64 MIL/uL — AB (ref 3.87–5.11)
RDW: 14.2 % (ref 11.5–15.5)
WBC: 17.3 10*3/uL — AB (ref 4.0–10.5)

## 2018-07-19 NOTE — Progress Notes (Signed)
MOB was referred for history of depression/anxiety. * Referral screened out by Clinical Social Worker because none of the following criteria appear to apply: ~ History of anxiety/depression during this pregnancy, or of post-partum depression following prior delivery. ~ Diagnosis of anxiety and/or depression within last 3 years OR * MOB's symptoms currently being treated with medication and/or therapy. Please contact the Clinical Social Worker if needs arise, by MOB request, or if MOB scores greater than 9/yes to question 10 on Edinburgh Postpartum Depression Screen.  Abbigale Mcelhaney, LCSW Clinical Social Worker  [facility]  [phone]  

## 2018-07-19 NOTE — Lactation Note (Signed)
This note was copied from a baby's chart. Lactation Consultation Note  Patient Name: Susan Krause [id number]'X Date: 07/19/2018 Reason for consult: Follow-up assessment   Mom states infant does not stay latched when breastfeeding.  LC works with mom to hand express and she does express a little.  LC can easily hand express easily and 8 ml is collected. Mom states dad is able to collect easily as well.  LC encourages mom to keep practicing.    Mom prefers lying down and has large breasts so blankets rolled under the breast were used to support.  Dad and mat. Grandmother shown the importance of pillow support.  Breast tissue is compressible but nipple flattens with latch.  Baby was repositioned from football to laid back and was unable to sustain a latch on either side.    24 NS used and prefilled with colostrum.  After several attempts infant latched to right side in cross cradle.  LC showed family what the latch should look like, deep with cheeks touching,  with no visible NS.  Rhythmic jaw movements noted with some swallows with compression.  Mom states this is the best he has fed.  Mom demonstrated prior to feed applying the nipple shield.  LC explained to family the importance of DEBP use during NS use due to barrier and that the pump helps stimulate breasts.  LC encouraged pumping after breastfeeds and to give back EBM to infant via spoon or curved tip syringe.  Mom knows to call out for assistance with latch or tools.    RN, Marisue Ivan, to set up DEBP.        Maternal Data    Feeding Feeding Type: Breast Fed  LATCH Score Latch: Repeated attempts needed to sustain latch, nipple held in mouth throughout feeding, stimulation needed to elicit sucking reflex.(latch only sustained with prefill NS)  Audible Swallowing: A few with stimulation(with nipple shield)  Type of Nipple: Flat(short shaft/compressible but flattens with latch)  Comfort (Breast/Nipple): Soft / non-tender  Hold  (Positioning): Assistance needed to correctly position infant at breast and maintain latch.  LATCH Score: 6  Interventions Interventions: Breast feeding basics reviewed;Assisted with latch;Skin to skin;Breast massage;Breast compression;Adjust position;Expressed milk;Position options;Support pillows  Lactation Tools Discussed/Used Tools: Other (comment);Nipple Shields Nipple shield size: 24   Consult Status Consult Status: Follow-up Date: 07/20/18 Follow-up type: In-patient    Maryruth [facility] 07/19/2018, 7:53 PM

## 2018-07-19 NOTE — Progress Notes (Signed)
Susan Krause [id number] Postpartum Postoperative Day # 1  Susan Krause, G1P1001, [redacted]w[redacted]d, S/P Primary LT Cesarean Section due to failure of fetal descent after IOL for GHTN.   Subjective: Patient in bed with foley in place and SCD on. Pt bonding well with the infant., denies syncope or dizziness. Reports consuming regular diet without issues and denies N/V. Patient reports 0 bowel movement + passing flatus.  Denies issues with urination with foley in place and reports bleeding is "lighter." Pt reports feeling weak in her legs and insicional pain when she moves. Pt report feelings of frustration due to not being able to be as independent as she would like.   Patient is Breastfeeding and reports going well.  Desires undecided, does not wants to use birth control for postpartum contraception due to the fact that she has been on [facility] for 8 years.  Pain is being appropriately managed while pt remains in bed with use of motrin and tylenol. Pt currently denies HA, vision changes nor RUQ pain.    Objective: Patient Vitals for the past 24 hrs:  BP Temp Temp src Pulse Resp SpO2  07/19/18 0500 - 98.6 F (37 C) Oral - 20 98 %  07/19/18 0000 116/84 98.7 F (37.1 C) Oral (!) 102 18 98 %  07/18/18 2030 121/81 98.6 F (37 C) Oral 83 18 97 %  07/18/18 1930 123/77 98.1 F (36.7 C) Oral 93 18 98 %  07/18/18 1817 131/80 98.2 F (36.8 C) Oral 89 20 99 %  07/18/18 1720 127/82 99.1 F (37.3 C) Oral 84 20 99 %  07/18/18 1711 127/84 99 F (37.2 C) Oral 92 - 97 %  07/18/18 1655 - - - 98 (!) 31 97 %  07/18/18 1650 - - - 90 12 98 %  07/18/18 1649 - - - 97 (!) 22 99 %  07/18/18 1645 - - - (!) 101 (!) 23 98 %  07/18/18 1640 - - - 90 18 98 %  07/18/18 1635 - - - (!) 103 20 98 %  07/18/18 1630 124/88 - - (!) 103 17 97 %  07/18/18 1625 - - - 93 (!) 25 97 %  07/18/18 1620 - - - 91 20 98 %  07/18/18 1615 (!) 152/89 - - 96 (!) 21 97 %  07/18/18 1610 - - - 90 (!) 31 98 %  07/18/18 1605 - - - 91 19 99 %  07/18/18 1600 128/81 -  - 93 18 99 %  07/18/18 1555 - - - 92 18 99 %  07/18/18 1550 - - - 96 17 98 %  07/18/18 1545 133/79 - - (!) 101 (!) 27 98 %  07/18/18 1540 - - - (!) 104 (!) 21 100 %  07/18/18 1535 - - - (!) 102 18 100 %  07/18/18 1532 124/75 98.9 F (37.2 C) Oral - - -  07/18/18 1530 - - - (!) 101 20 100 %  07/18/18 1401 123/76 - - (!) 118 18 -  07/18/18 1331 131/79 - - (!) 107 20 -  07/18/18 1305 (!) 121/99 100.3 F (37.9 C) Axillary (!) 103 18 -  07/18/18 1231 122/73 - - 99 - -  07/18/18 1201 118/76 - - (!) 115 18 -  07/18/18 1133 128/74 - - (!) 104 20 -  07/18/18 1100 126/75 - - (!) 103 20 -  07/18/18 1031 112/64 99.5 F (37.5 C) Axillary (!) 108 20 -  07/18/18 1001 115/62 - - (!) 101 18 -  07/18/18 0931 109/65 - - 91 18 -  07/18/18 0901 (!) 100/53 99.3 F (37.4 C) Axillary 97 20 -  07/18/18 0841 (!) 98/59 - - (!) 108 20 -  07/18/18 0838 - - - - - 100 %  07/18/18 0836 139/71 - - (!) 103 18 -  07/18/18 0835 - - - - - 100 %  07/18/18 0831 131/85 - - (!) 101 20 -  07/18/18 0830 - - - - - 100 %  07/18/18 0827 - - - - 18 -  07/18/18 0826 135/87 - - (!) 110 18 -  07/18/18 0825 - - - - - 100 %  07/18/18 0821 (!) 134/92 - - (!) 101 20 -  07/18/18 0820 - - - - - 100 %  07/18/18 0817 135/81 - - (!) 101 18 -  07/18/18 0815 - - - - - 100 %  07/18/18 0811 (!) 154/102 - - (!) 111 18 -  07/18/18 0810 - - - - - 99 %  07/18/18 0808 137/83 - - (!) 130 18 -  07/18/18 0803 (!) 129/95 - - - 18 -  07/18/18 0800 104/65 - - 100 18 -     Physical Exam:  General: alert, cooperative, appears stated age and no distress Mood/Affect: Happy Lungs: clear to auscultation, no wheezes, rales or rhonchi, symmetric air entry.  Heart: normal rate, regular rhythm, normal S1, S2, no murmurs, rubs, clicks or gallops. Breast: breasts appear normal, no suspicious masses, no skin or nipple changes or axillary nodes. Abdomen:  + bowel sounds, soft, non-tender Incision: healing well, no significant drainage, no dehiscence, no  significant erythema, pressure dressing dry and intact with Honeycomb dressing underneath.  Uterine Fundus: firm, involution -1 Lochia: appropriate Skin: Warm, Dry. DVT Evaluation: No evidence of DVT seen on physical exam. Negative Homan's sign. No cords or calf tenderness. No significant calf/ankle edema.  Labs: Recent Labs    07/17/18 0255 07/18/18 1636 07/19/18 0525  HGB 11.5* 12.0 10.6*  HCT 34.4* 35.8* 31.8*  WBC 11.0* 22.5* 17.3*    CBG (last 3)  Recent Labs    07/17/18 0720  GLUCAP 93     I/O: I/O last 3 completed shifts: In: 4472 [P.O.:240; I.V.:4112.9; Other:119] Out: 6790 [Urine:6550; Blood:240]   Assessment Postpartum Postoperative Day # 1. Susan Krause, G1P1001, [redacted]w[redacted]d, S/P Primary LT Cesarean Section due to failure of fetal descent after IOL for GHTN..  Pt stable. -1 Involution. BreastFeeding. Hemodynamically Stable.Post-op HGB of 10.6 down from 12. Pt feeling insicional pain and weakness in lower extremities, but declines wanting to take anything heavier than motrin and tylenol for pain control. Pt was encourage to take oxy as needed before getting out of bed. Denies SI/HI. BP stable and asymptomatic at 116/84.  Plan: Continue other mgmt as ordered GHTN: Continue to monitor BP and start labetalol if BP >150/100. Anxiety: Monitor pt feeling. Declines medication at this time. VTE Prophylactics: SCD, ambulated as tolerates.  Pain control: Motrin/Tylenol/Narcotics PRN Education given regarding options for contraception, including barrier methods, injectable contraception, IUD placement, oral contraceptives.  Breastfeeding, Lactation consult and Contraception Undecided, but thinking about Pills, Baby Female is to get an out-pt circ.   Dr. Su Hilt to be updated on patient status  Susan Reh NP-C, CNM 07/19/2018, 7:58 AM

## 2018-07-19 NOTE — Addendum Note (Signed)
Addendum  created 07/19/18 0756 by Junious Silk, CRNA   Sign clinical note

## 2018-07-19 NOTE — Anesthesia Postprocedure Evaluation (Signed)
Anesthesia Post Note  Patient: [name]  Procedure(s) Performed: CESAREAN SECTION (N/A Abdomen)     Patient location during evaluation: Mother Baby Anesthesia Type: Epidural Level of consciousness: awake and alert Pain management: pain level controlled Vital Signs Assessment: post-procedure vital signs reviewed and stable Respiratory status: spontaneous breathing, nonlabored ventilation and respiratory function stable Cardiovascular status: stable Postop Assessment: no headache, no backache and epidural receding Anesthetic complications: no    Last Vitals:  Vitals:   07/19/18 0000 07/19/18 0500  BP: 116/84   Pulse: (!) 102   Resp: 18 20  Temp: 37.1 C 37 C  SpO2: 98% 98%    Last Pain:  Vitals:   07/19/18 0500  TempSrc: Oral  PainSc:    Pain Goal: Patients Stated Pain Goal: 3 (07/18/18 1817)               Junious Silk

## 2018-07-20 NOTE — Lactation Note (Signed)
This note was copied from a baby's chart. Lactation Consultation Note  Patient Name: Susan Krause [id number] Date: 07/20/2018 Reason for consult: Follow-up assessment;Difficult latch;Hyperbilirubinemia Mom called out for assist.  Attempted to latch baby to breast without shield but baby unable to sustain latch.  24 mm nipple shield applied.  After several attempts baby latched to shield.  Baby sucked actively for 15 minutes but no swallows identified.  Discussed supplementing with formula.  Mom agreeable.  5 french feeding tube and syringe used at breast first but too much formula was leaking out so remainder of formula cup fed using a foley cup.  Baby took 22 mls and tolerated well.  Mom has not pumped since last night.  Assisted with pumping.  Instructed to breastfeed with cues but at least every 3 hours, post pump x 15 minutes and supplement with 20 mls of formula/expressed milk by foley cup.  Encouraged to call for assist prn.  Maternal Data    Feeding Feeding Type: Breast Fed  LATCH Score Latch: Grasps breast easily, tongue down, lips flanged, rhythmical sucking.(with nipple shield)  Audible Swallowing: None  Type of Nipple: Everted at rest and after stimulation  Comfort (Breast/Nipple): Soft / non-tender  Hold (Positioning): Assistance needed to correctly position infant at breast and maintain latch.  LATCH Score: 7  Interventions    Lactation Tools Discussed/Used     Consult Status Consult Status: Follow-up Date: 07/21/18 Follow-up type: In-patient    Huston Foley 07/20/2018, 12:10 PM

## 2018-07-20 NOTE — Progress Notes (Signed)
Subjective: Postpartum Day 2: Cesarean Delivery Patient reports incisional pain, tolerating PO, + flatus and no problems voiding.  Patient reports 1 episode of urge incontinence, suspect related to 3.5hrs of pushing prior to cesarean section. Discussed possible causes for this episode and patient to report if it is recurrent or getting worse instead of improving. Discussed possible pelvic floor PT if problem persists. She desires to stay another day and wants to try and be more active in the hospital today to prepare for discharge tomorrow. She denies symptoms of preeclampsia.   Objective: Vitals:   07/19/18 1230 07/19/18 1751 07/19/18 2300 07/20/18 0453  BP: 113/75 128/79 117/82 114/72  Pulse: 87 89 90 95  Resp: 20 20 18 18   Temp: 98.2 F (36.8 C) 98.2 F (36.8 C) 98.3 F (36.8 C) 98.2 F (36.8 C)  TempSrc: Oral Oral Oral Oral  SpO2: 100% 99% 99% 97%  Weight:      Height:        Physical Exam:  General: alert and cooperative Lochia: appropriate Uterine Fundus: firm Incision: healing well, no significant drainage, no dehiscence, no significant erythema DVT Evaluation: No evidence of DVT seen on physical exam. Negative Homan's sign. No cords or calf tenderness. No significant calf/ankle edema.  Recent Labs    07/18/18 1636 07/19/18 0525  HGB 12.0 10.6*  HCT 35.8* 31.8*    Assessment/Plan: Status post Cesarean section. Doing well postoperatively.  Continue current care.  [name] 07/20/2018, 8:38 AM

## 2018-07-21 MED ORDER — SIMETHICONE 80 MG PO CHEW: 80.0000 mg | CHEWABLE_TABLET | ORAL | 0 refills | Status: DC

## 2018-07-21 MED ORDER — OXYCODONE-ACETAMINOPHEN 5-325 MG PO TABS: 1.0000 | ORAL_TABLET | ORAL | 0 refills | Status: DC | PRN

## 2018-07-21 MED ORDER — SIMETHICONE 80 MG PO CHEW: 80.0000 mg | CHEWABLE_TABLET | Freq: Three times a day (TID) | ORAL | 0 refills | Status: DC

## 2018-07-21 MED ORDER — SENNOSIDES-DOCUSATE SODIUM 8.6-50 MG PO TABS: 2.0000 | ORAL_TABLET | ORAL | 1 refills | Status: DC

## 2018-07-21 NOTE — Discharge Summary (Signed)
OB Discharge Summary     Patient Name: Susan Krause DOB: 1990-09-20 MRN: [id number]  Date of admission: 07/17/2018 Delivering MD: Essie Hart   Date of discharge: 07/21/2018  Admitting diagnosis: 38.5WKS HEADACHES, HBP Intrauterine pregnancy: [redacted]w[redacted]d     Secondary diagnosis:  Active Problems:   Encounter for induction of labor  Additional problems: anxiety ; PTSD     Discharge diagnosis: Term Pregnancy Delivered                                 Gestational hypertension                                                               Post partum procedures:  Augmentation: Pitocin and Cytotec  Complications: None  Hospital course:  Induction of Labor With Cesarean Section  27 y.o. yo G1P1001 at [redacted]w[redacted]d was admitted to the hospital 07/17/2018 for induction of labor. Patient had a labor course significant for arrest of descent. The patient went for cesarean section due to Arrest of Dilation, and delivered a Viable infant,07/18/2018  Membrane Rupture Time/Date: 10:20 PM ,07/17/2018   Details of operation can be found in separate operative Note.  Patient had an uncomplicated postpartum course. She is ambulating, tolerating a regular diet, passing flatus, and urinating well.  Patient is discharged home in stable condition on 07/21/18.                                    Physical exam  Vitals:   07/20/18 1414 07/20/18 1600 07/20/18 2339 07/21/18 0525  BP: (!) 125/94 122/82 119/80 117/80  Pulse: 81 100 90 84  Resp: 20  18 18   Temp: 98.1 F (36.7 C)  98.2 F (36.8 C) 97.6 F (36.4 C)  TempSrc: Oral  Oral Oral  SpO2: 99% 100%    Weight:      Height:       General: alert, cooperative and no distress Lochia: appropriate Uterine Fundus: firm Incision: Dressing is clean, dry, and intact DVT Evaluation: No evidence of DVT seen on physical exam. Labs: Lab Results  Component Value Date   WBC 17.3 (H) 07/19/2018   HGB 10.6 (L) 07/19/2018   HCT 31.8 (L) 07/19/2018   MCV 87.4 07/19/2018   PLT  184 07/19/2018   CMP Latest Ref Rng & Units 07/17/2018  Glucose 70 - 99 mg/dL 409(W)  BUN 6 - 20 mg/dL 6  Creatinine 1.19 - 1.47 mg/dL 8.29  Sodium 562 - 130 mmol/L 136  Potassium 3.5 - 5.1 mmol/L 3.5  Chloride 98 - 111 mmol/L 107  CO2 22 - 32 mmol/L 20(L)  Calcium 8.9 - 10.3 mg/dL 9.1  Total Protein 6.5 - 8.1 g/dL 5.9(L)  Total Bilirubin 0.3 - 1.2 mg/dL 0.6  Alkaline Phos 38 - 126 U/L 122  AST 15 - 41 U/L 20  ALT 0 - 44 U/L 16    Discharge instruction: per After Visit Summary and "Baby and Me Booklet".  After visit meds:  Ibuprofen Percocet Gas X  Diet: routine diet  Activity: Advance as tolerated. Pelvic rest for 6 weeks.   Outpatient follow up:1 week for  incision check and depression screen Follow up Appt:No future appointments. Follow up Visit:No follow-ups on file.  Postpartum contraception: Not Discussed  Newborn Data: Live born female  Birth Weight: 8 lb 3 oz (3715 g) APGAR: 8, 8  Newborn Delivery   Birth date/time:  07/18/2018 14:37:00 Delivery type:  C-Section, Vacuum Assisted Trial of labor:  Yes C-section categorization:  Primary     Baby Feeding: Breast Disposition:rooming in due to Juandice in the baby   07/21/2018 Rhea Pink, CNM

## 2018-07-21 NOTE — Lactation Note (Signed)
This note was copied from a baby's chart. Lactation Consultation Note  Patient Name: Susan Krause [id number]'X Date: 07/21/2018 Reason for consult: Follow-up assessment;Infant weight loss;Hyperbilirubinemia Milk is coming in.  Mom just pumped 30 mls of transitional milk.  She is feeling like she will pump and bottle feed due to difficult latch.  Instructed to pump 8+/24 hours.  Weight is stable but bilirubin level is elevated.  Pediatrician will recheck bili this afternoon and decide on discharge.  Maternal Data    Feeding Feeding Type: Breast Milk  LATCH Score                   Interventions    Lactation Tools Discussed/Used     Consult Status Consult Status: Follow-up Date: 07/22/18 Follow-up type: In-patient    Huston Foley 07/21/2018, 9:25 AM

## 2018-07-21 NOTE — Plan of Care (Signed)
Progressing slowly. Encouraged to call for assistance as needed, and for [facility] assessment. Much reinforcement of teaching required. Both parents anxious and asking a lot of questions. Reinforced teaching multiple times.

## 2018-07-21 NOTE — Discharge Instructions (Signed)
Home Care Instructions for Mom °ACTIVITY °· Gradually return to your regular activities. °· Let yourself rest. Nap while your baby sleeps. °· Avoid lifting anything that is heavier than 10 lb (4.5 kg) until your health care provider says it is okay. °· Avoid activities that take a lot of effort and energy (are strenuous) until approved by your health care provider. Walking at a slow-to-moderate pace is usually safe. °· If you had a cesarean delivery: °? Do not vacuum, climb stairs, or drive a car for 4-6 weeks. °? Have someone help you at home until you feel like you can do your usual activities yourself. °? Do exercises as told by your health care provider, if this applies. ° °VAGINAL BLEEDING °You may continue to bleed for 4-6 weeks after delivery. Over time, the amount of blood usually decreases and the color of the blood usually gets lighter. However, the flow of bright red blood may increase if you have been too active. If you need to use more than one pad in an hour because your pad gets soaked, or if you pass a large clot: °· Lie down. °· Raise your feet. °· Place a cold compress on your lower abdomen. °· Rest. °· Call your health care provider. ° °If you are breastfeeding, your period should return anytime between 8 weeks after delivery and the time that you stop breastfeeding. If you are not breastfeeding, your period should return 6-8 weeks after delivery. °PERINEAL CARE °The perineal area, or perineum, is the part of your body between your thighs. After delivery, this area needs special care. Follow these instructions as told by your health care provider. °· Take warm tub baths for 15-20 minutes. °· Use medicated pads and pain-relieving sprays and creams as told. °· Do not use tampons or douches until vaginal bleeding has stopped. °· Each time you go to the bathroom: °? Use a peri bottle. °? Change your pad. °? Use towelettes in place of toilet paper until your stitches have healed. °· Do Kegel exercises  every day. Kegel exercises help to maintain the muscles that support the vagina, bladder, and bowels. You can do these exercises while you are standing, sitting, or lying down. To do Kegel exercises: °? Tighten the muscles of your abdomen and the muscles that surround your birth canal. °? Hold for a few seconds. °? Relax. °? Repeat until you have done this 5 times in a row. °· To prevent hemorrhoids from developing or getting worse: °? Drink enough fluid to keep your urine clear or pale yellow. °? Avoid straining when having a bowel movement. °? Take over-the-counter medicines and stool softeners as told by your health care provider. ° °BREAST CARE °· Wear a tight-fitting bra. °· Avoid taking over-the-counter pain medicine for breast discomfort. °· Apply ice to the breasts to help with discomfort as needed: °? Put ice in a plastic bag. °? Place a towel between your skin and the bag. °? Leave the ice on for 20 minutes or as told by your health care provider. ° °NUTRITION °· Eat a well-balanced diet. °· Do not try to lose weight quickly by cutting back on calories. °· Take your prenatal vitamins until your postpartum checkup or until your health care provider tells you to stop. ° °POSTPARTUM DEPRESSION °You may find yourself crying for no apparent reason and unable to cope with all of the changes that come with having a newborn. This mood is called postpartum depression. Postpartum depression happens because your hormone   levels change after delivery. If you have postpartum depression, get support from your partner, friends, and family. If the depression does not go away on its own after several weeks, contact your health care provider. BREAST SELF-EXAM Do a breast self-exam each month, at the same time of the month. If you are breastfeeding, check your breasts just after a feeding, when your breasts are less full. If you are breastfeeding and your period has started, check your breasts on day 5, 6, or 7 of your  period. Report any lumps, bumps, or discharge to your health care provider. Know that breasts are normally lumpy if you are breastfeeding. This is temporary, and it is not a health risk. INTIMACY AND SEXUALITY Avoid sexual activity for at least 3-4 weeks after delivery or until the brownish-red vaginal flow is completely gone. If you want to avoid pregnancy, use some form of birth control. You can get pregnant after delivery, even if you have not had your period. SEEK MEDICAL CARE IF:  You feel unable to cope with the changes that a child brings to your life, and these feelings do not go away after several weeks.  You notice a lump, a bump, or discharge on your breast.  SEEK IMMEDIATE MEDICAL CARE IF:  Blood soaks your pad in 1 hour or less.  You have: ? Severe pain or cramping in your lower abdomen. ? A bad-smelling vaginal discharge. ? A fever that is not controlled by medicine. ? A fever, and an area of your breast is red and sore. ? Pain or redness in your calf. ? Sudden, severe chest pain. ? Shortness of breath. ? Painful or bloody urination. ? Problems with your vision.  You vomit for 12 hours or longer.  You develop a severe headache.  You have serious thoughts about hurting yourself, your child, or anyone else.  This information is not intended to replace advice given to you by your health care provider. Make sure you discuss any questions you have with your health care provider. Document Released: 08/27/2000 Document Revised: 02/05/2016 Document Reviewed: 03/03/2015 Elsevier Interactive Patient Education  2017 [employer] Postpartum Depression and Baby Blues The postpartum period begins right after the birth of a baby. During this time, there is often a great amount of joy and excitement. It is also a time of many changes in the life of the parents. Regardless of how many times a mother gives birth, each child brings new challenges and dynamics to the family. It is not  unusual to have feelings of excitement along with confusing shifts in moods, emotions, and thoughts. All mothers are at risk of developing postpartum depression or the "baby blues." These mood changes can occur right after giving birth, or they may occur many months after giving birth. The baby blues or postpartum depression can be mild or severe. Additionally, postpartum depression can go away rather quickly, or it can be a long-term condition. What are the causes? Raised hormone levels and the rapid drop in those levels are thought to be a main cause of postpartum depression and the baby blues. A number of hormones change during and after pregnancy. Estrogen and progesterone usually decrease right after the delivery of your baby. The levels of thyroid hormone and various cortisol steroids also rapidly drop. Other factors that play a role in these mood changes include major life events and genetics. What increases the risk? If you have any of the following risks for the baby blues or postpartum depression, know  what symptoms to watch out for during the postpartum period. Risk factors that may increase the likelihood of getting the baby blues or postpartum depression include:  Having a personal or family history of depression.  Having depression while being pregnant.  Having premenstrual mood issues or mood issues related to oral contraceptives.  Having a lot of life stress.  Having marital conflict.  Lacking a social support network.  Having a baby with special needs.  Having health problems, such as diabetes.  What are the signs or symptoms? Symptoms of baby blues include:  Brief changes in mood, such as going from extreme happiness to sadness.  Decreased concentration.  Difficulty sleeping.  Crying spells, tearfulness.  Irritability.  Anxiety.  Symptoms of postpartum depression typically begin within the first month after giving birth. These symptoms include:  Difficulty  sleeping or excessive sleepiness.  Marked weight loss.  Agitation.  Feelings of worthlessness.  Lack of interest in activity or food.  Postpartum psychosis is a very serious condition and can be dangerous. Fortunately, it is rare. Displaying any of the following symptoms is cause for immediate medical attention. Symptoms of postpartum psychosis include:  Hallucinations and delusions.  Bizarre or disorganized behavior.  Confusion or disorientation.  How is this diagnosed? A diagnosis is made by an evaluation of your symptoms. There are no medical or lab tests that lead to a diagnosis, but there are various questionnaires that a health care provider may use to identify those with the baby blues, postpartum depression, or psychosis. Often, a screening tool called the [place] Postnatal Depression Scale is used to diagnose depression in the postpartum period. How is this treated? The baby blues usually goes away on its own in 1-2 weeks. Social support is often all that is needed. You will be encouraged to get adequate sleep and rest. Occasionally, you may be given medicines to help you sleep. Postpartum depression requires treatment because it can last several months or longer if it is not treated. Treatment may include individual or group therapy, medicine, or both to address any social, physiological, and psychological factors that may play a role in the depression. Regular exercise, a healthy diet, rest, and social support may also be strongly recommended. Postpartum psychosis is more serious and needs treatment right away. Hospitalization is often needed. Follow these instructions at home:  Get as much rest as you can. Nap when the baby sleeps.  Exercise regularly. Some women find yoga and walking to be beneficial.  Eat a balanced and nourishing diet.  Do little things that you enjoy. Have a cup of tea, take a bubble bath, read your favorite magazine, or listen to your favorite  music.  Avoid alcohol.  Ask for help with household chores, cooking, grocery shopping, or running errands as needed. Do not try to do everything.  Talk to people close to you about how you are feeling. Get support from your partner, family members, friends, or other new moms.  Try to stay positive in how you think. Think about the things you are grateful for.  Do not spend a lot of time alone.  Only take over-the-counter or prescription medicine as directed by your health care provider.  Keep all your postpartum appointments.  Let your health care provider know if you have any concerns. Contact a health care provider if: You are having a reaction to or problems with your medicine. Get help right away if:  You have suicidal feelings.  You think you may harm the baby  or someone else. °This information is not intended to replace advice given to you by your health care provider. Make sure you discuss any questions you have with your health care provider. °Document Released: 06/03/2004 Document Revised: 02/05/2016 Document Reviewed: 06/11/2013 °Elsevier Interactive Patient Education © 2017 Elsevier Inc. ° °

## 2018-07-22 ENCOUNTER — Ambulatory Visit: Payer: Self-pay

## 2018-07-22 NOTE — Lactation Note (Signed)
This note was copied from a baby's chart. Lactation Consultation Note  Patient Name: Susan Krause [id number] Date: 07/22/2018 Reason for consult: Follow-up assessment   Baby 91 hours old on photherapy.  Bilirubin decreased and weight increased. Mother is pumping approx 3 oz.  She is giving baby approx 30 ml in bottle due to difficult latch. Offered to assist with latching if desired.  Mother may call. Mother has personal DEBP at home. Mom encouraged to feed baby 8-12 times/24 hours and with feeding cues.  Suggest she pump q2.-5-3 hours during the day with a longer span between pumping during the night. Mom encouraged to feed baby 8-12 times/24 hours and with feeding cues.  Reviewed engorgement care and monitoring voids/stools.    Maternal Data    Feeding    LATCH Score                   Interventions Interventions: DEBP  Lactation Tools Discussed/Used     Consult Status Consult Status: Complete Date: 07/22/18    Dahlia Byes [facility] 07/22/2018, 9:49 AM

## 2018-09-26 ENCOUNTER — Encounter (HOSPITAL_COMMUNITY): Payer: Self-pay

## 2018-09-26 ENCOUNTER — Other Ambulatory Visit: Payer: Self-pay

## 2018-09-26 ENCOUNTER — Inpatient Hospital Stay (HOSPITAL_COMMUNITY)
Admission: AD | Admit: 2018-09-26 | Discharge: 2018-09-26 | Disposition: A | Discharge status: Home / Self Care | Payer: BLUE CROSS/BLUE SHIELD | Source: Ambulatory Visit | Attending: Obstetrics & Gynecology | Admitting: Obstetrics & Gynecology

## 2018-09-26 DIAGNOSIS — R5383 Other fatigue: Secondary | ICD-10-CM | POA: Diagnosis not present

## 2018-09-26 DIAGNOSIS — N920 Excessive and frequent menstruation with regular cycle: Secondary | ICD-10-CM | POA: Insufficient documentation

## 2018-09-26 DIAGNOSIS — N946 Dysmenorrhea, unspecified: Secondary | ICD-10-CM | POA: Diagnosis not present

## 2018-09-26 DIAGNOSIS — N921 Excessive and frequent menstruation with irregular cycle: Secondary | ICD-10-CM | POA: Diagnosis not present

## 2018-09-26 LAB — CBC WITH DIFFERENTIAL/PLATELET
BASOS PCT: 0 %
Basophils Absolute: 0 10*3/uL (ref 0.0–0.1)
EOS ABS: 0.1 10*3/uL (ref 0.0–0.5)
Eosinophils Relative: 2 %
HEMATOCRIT: 39 % (ref 36.0–46.0)
Hemoglobin: 12.7 g/dL (ref 12.0–15.0)
Lymphocytes Relative: 34 %
Lymphs Abs: 2.3 10*3/uL (ref 0.7–4.0)
MCH: 27.4 pg (ref 26.0–34.0)
MCHC: 32.6 g/dL (ref 30.0–36.0)
MCV: 84.1 fL (ref 80.0–100.0)
MONO ABS: 0.4 10*3/uL (ref 0.1–1.0)
Monocytes Relative: 6 %
NEUTROS ABS: 4 10*3/uL (ref 1.7–7.7)
NEUTROS PCT: 58 %
PLATELETS: 246 10*3/uL (ref 150–400)
RBC: 4.64 MIL/uL (ref 3.87–5.11)
RDW: 13.4 % (ref 11.5–15.5)
WBC: 6.8 10*3/uL (ref 4.0–10.5)
nRBC: 0 % (ref 0.0–0.2)

## 2018-09-26 LAB — POCT PREGNANCY, URINE: Preg Test, Ur: NEGATIVE

## 2018-09-26 NOTE — MAU Provider Note (Signed)
History     CSN: [id number]  Arrival date and time: 09/26/18 1457   First Provider Initiated Contact with Patient 09/26/18 1617      Chief Complaint  Patient presents with  . Vaginal Bleeding   Susan Krause is a 28 y.o. G1P1001 who is 10 weeks S/P LTCS here today with vaginal bleeding. She states that she started bleeding on 09/23/18, and she has been bleeding since. She reports that it is like a heavy period, and assumed it was her period started. She states that she has been dizzy and fatigued as well. She took a nap, and her family had a hard time waking her up today. She called her OB office and was advised to come here for evaluation.   Vaginal Bleeding  The patient's primary symptoms include vaginal bleeding. The patient's pertinent negatives include no pelvic pain or vaginal discharge. This is a new problem. The current episode started in the past 7 days. The problem occurs constantly. The problem has been gradually improving. The patient is experiencing no pain. The problem affects both sides. She is not pregnant. Associated symptoms include nausea. Pertinent negatives include no chills, dysuria, fever, frequency or vomiting. The vaginal discharge was bloody. The vaginal bleeding is typical of menses. She has been passing clots (about the size of a dime ). She has not been passing tissue. Nothing aggravates the symptoms. She has tried nothing for the symptoms. She is sexually active. She uses nothing for contraception.    OB History    Gravida  1   Para  1   Term  1   Preterm      AB      Living  1     SAB      TAB      Ectopic      Multiple  0   Live Births  1           Past Medical History:  Diagnosis Date  . ADHD (attention deficit hyperactivity disorder)    No meds  . Cervical dysplasia   . Chlamydia   . Migraine    Propanolol 10 mg  . PTSD (post-traumatic stress disorder)    no meds    Past Surgical History:  Procedure Laterality Date  .  CESAREAN SECTION N/A 07/18/2018   Procedure: CESAREAN SECTION;  Surgeon: Essie Hart, MD;  [facility]: [facility] BIRTHING SUIT[facility];  Service: Obstetrics;  Laterality: N/A;  . WISDOM TOOTH EXTRACTION      Family History  Problem Relation Age of Onset  . ADD / ADHD Mother   . Anxiety disorder Mother   . Depression Mother   . Hyperlipidemia Mother   . Obesity Mother   . ADD / ADHD Father   . Anxiety disorder Sister   . Asthma Sister   . Depression Sister   . Intellectual disability Brother   . Learning disabilities Brother   . ADD / ADHD Sister   . Anxiety disorder Maternal Aunt   . Diabetes Maternal Aunt   . Alcohol abuse Maternal Uncle   . Hyperlipidemia Maternal Uncle   . Anxiety disorder Paternal Uncle   . Diabetes Paternal Uncle   . Anxiety disorder Maternal Grandmother   . Diabetes Maternal Grandmother   . Hyperlipidemia Maternal Grandmother   . Varicose Veins Maternal Grandmother   . Cancer Maternal Grandfather   . Hyperlipidemia Maternal Grandfather   . Obesity Maternal Grandfather   . Vision loss Maternal Grandfather   . Cancer Paternal  Grandmother     Social History   Tobacco Use  . Smoking status: Never Smoker  . Smokeless tobacco: Never Used  Substance Use Topics  . Alcohol use: No  . Drug use: No    Allergies:  Allergies  Allergen Reactions  . Contrast Media [Iodinated Diagnostic Agents] Nausea And Vomiting  . Gardasil [Hpv 4-Valent Vaccine Recombinant Vaccine]   . Peanut Oil Swelling    Tongue swells/itching  . Pumpkin Flavor Swelling  . Hpv Bival (Type 16,18) Recomb Vaccine  [Human Papillomavirus (16,18) Recomb Vac] Hives  . Lactose Intolerance (Gi)     Medications Prior to Admission  Medication Sig Dispense Refill Last Dose  . Butalbital-APAP-Caffeine 50-300-40 MG CAPS Take 1 capsule by mouth every 4 (four) hours as needed for headache.  1 07/17/2018 at Unknown time  . metroNIDAZOLE (FLAGYL) 500 MG tablet Take 500 mg by mouth 2 (two) times daily. 7 day  course started 07-10-18   07/16/2018 at Unknown time  . oxyCODONE-acetaminophen (PERCOCET/ROXICET) 5-325 MG tablet Take 1 tablet by mouth every 4 (four) hours as needed (pain scale 4-7). 30 tablet 0   . Prenatal Vit-Fe Fumarate-FA (MULTIVITAMIN-PRENATAL) 27-0.8 MG TABS tablet Take 1 tablet by mouth at bedtime.    07/16/2018 at Unknown time  . promethazine (PHENERGAN) 12.5 MG tablet Take 12.5 mg by mouth every 8 (eight) hours as needed for nausea.    07/17/2018 at Unknown time  . propranolol (INDERAL) 10 MG tablet Take 10 mg by mouth at bedtime.    07/16/2018 at Unknown time  . senna-docusate (SENOKOT-S) 8.6-50 MG tablet Take 2 tablets by mouth daily. 60 tablet 1   . simethicone (MYLICON) 80 MG chewable tablet Chew 1 tablet (80 mg total) by mouth 3 (three) times daily after meals. 30 tablet 0   . simethicone (MYLICON) 80 MG chewable tablet Chew 1 tablet (80 mg total) by mouth daily. 30 tablet 0     Review of Systems  Constitutional: Negative for chills and fever.  Gastrointestinal: Positive for nausea. Negative for vomiting.  Genitourinary: Positive for vaginal bleeding. Negative for dysuria, frequency, pelvic pain and vaginal discharge.   Physical Exam   Blood pressure 133/88, pulse 80, temperature 98.4 F (36.9 C), temperature source Oral, resp. rate 16, last menstrual period 09/24/2018, SpO2 99 %, currently breastfeeding.  Physical Exam  Nursing note and vitals reviewed. Constitutional: She is oriented to person, place, and time. She appears well-developed and well-nourished. No distress.  HENT:  Head: Normocephalic.  Cardiovascular: Normal rate.  Respiratory: Effort normal.  GI: Soft. There is no abdominal tenderness. There is no rebound.  Neurological: She is alert and oriented to person, place, and time.  Skin: Skin is warm and dry.  Psychiatric: She has a normal mood and affect.   Results for orders placed or performed during the hospital encounter of 09/26/18 (from the past 24  hour(s))  Pregnancy, urine POC     Status: None   Collection Time: 09/26/18  3:47 PM  Result Value Ref Range   Preg Test, Ur NEGATIVE NEGATIVE  CBC with Differential/Platelet     Status: None   Collection Time: 09/26/18  4:08 PM  Result Value Ref Range   WBC 6.8 4.0 - 10.5 K/uL   RBC 4.64 3.87 - 5.11 MIL/uL   Hemoglobin 12.7 12.0 - 15.0 g/dL   HCT 40.939.0 81.136.0 - 91.446.0 %   MCV 84.1 80.0 - 100.0 fL   MCH 27.4 26.0 - 34.0 pg   MCHC 32.6 30.0 -  36.0 g/dL   RDW 78.6 76.7 - 20.9 %   Platelets 246 150 - 400 K/uL   nRBC 0.0 0.0 - 0.2 %   Neutrophils Relative % 58 %   Neutro Abs 4.0 1.7 - 7.7 K/uL   Lymphocytes Relative 34 %   Lymphs Abs 2.3 0.7 - 4.0 K/uL   Monocytes Relative 6 %   Monocytes Absolute 0.4 0.1 - 1.0 K/uL   Eosinophils Relative 2 %   Eosinophils Absolute 0.1 0.0 - 0.5 K/uL   Basophils Relative 0 %   Basophils Absolute 0.0 0.0 - 0.1 K/uL    MAU Course  Procedures  MDM Hgb increased to 12.7 from 10.6 after c-section. Patient reassured that this is likely the return of menses after childbirth, and this can occasionally be heavier than what she is used to. No anemia at this time, and patient is ok to FU with OB office with any other concerns.   Assessment and Plan   1. Menorrhagia with irregular cycle   2. Dysmenorrhea   3. Fatigue, unspecified type    DC home Comfort measures reviewed  Bleeding precautions RX: none  Return to MAU as needed FU with OB as planned  Follow-up Information    [facility] Follow up.   Specialty:  Obstetrics and Gynecology Contact information: [address] [place] [address] [phone]           Thressa Sheller DNP, CNM  09/26/18  4:31 PM

## 2018-09-26 NOTE — MAU Note (Signed)
Delivered c/s 11/5. A few days ago, thought she had started her period.   Was Bleeding very heavy (bleeding through tampon and pad in under an hour).  Having headaches. Did not take anything for it. Usually is a light sleeper, today they couldn't wake her.  Has been dizzy.

## 2018-09-26 NOTE — MAU Note (Signed)
Urine in lab 

## 2018-09-26 NOTE — Discharge Instructions (Signed)
Dysmenorrhea  Dysmenorrhea means painful cramps during your period (menstrual period). You will have pain in your lower belly (abdomen). The pain is caused by the tightening (contracting) of the muscles of the womb (uterus). The pain may be mild or very bad. With this condition, you may:  Have a headache.  Feel sick to your stomach (nauseous).  Throw up (vomit).  Have lower back pain.  Follow these instructions at home:  Helping pain and cramping    Put heat on your lower back or belly when you have pain or cramps. Use the heat source that your doctor tells you to use.  Place a towel between your skin and the heat.  Leave the heat on for 20-30 minutes.  Remove the heat if your skin turns bright red. This is especially important if you cannot feel pain, heat, or cold.  Do not have a heating pad on during sleep.  Do aerobic exercises. These include walking, swimming, or biking. These may help with cramps.  Massage your lower back or belly. This may help lessen pain.  General instructions  Take over-the-counter and prescription medicines only as told by your doctor.  Do not drive or use heavy machinery while taking prescription pain medicine.  Avoid alcohol and caffeine during and right before your period. These can make cramps worse.  Do not use any products that have nicotine or tobacco. These include cigarettes and e-cigarettes. If you need help quitting, ask your doctor.  Keep all follow-up visits as told by your doctor. This is important.  Contact a doctor if:  You have pain that gets worse.  You have pain that does not get better with medicine.  You have pain during sex.  You feel sick to your stomach or you throw up during your period, and medicine does not help.  Get help right away if:  You pass out (faint).  Summary  Dysmenorrhea means painful cramps during your period (menstrual period).  Put heat on your lower back or belly when you have pain or cramps.  Do exercises like walking, swimming, or biking to  help with cramps.  Contact a doctor if you have pain during sex.  This information is not intended to replace advice given to you by your health care provider. Make sure you discuss any questions you have with your health care provider.  Document Released: 11/26/2008 Document Revised: 09/16/2016 Document Reviewed: 09/16/2016  Elsevier Interactive Patient Education  2019 [employer] Inc[employer]

## 2019-07-24 ENCOUNTER — Emergency Department (INDEPENDENT_AMBULATORY_CARE_PROVIDER_SITE_OTHER)
Admission: EM | Admit: 2019-07-24 | Discharge: 2019-07-24 | Disposition: A | Discharge status: Home / Self Care | Payer: BC Managed Care – PPO | Source: Home / Self Care | Attending: Internal Medicine | Admitting: Internal Medicine

## 2019-07-24 ENCOUNTER — Other Ambulatory Visit: Payer: Self-pay

## 2019-07-24 DIAGNOSIS — S060X0A Concussion without loss of consciousness, initial encounter: Secondary | ICD-10-CM

## 2019-07-24 DIAGNOSIS — S0003XA Contusion of scalp, initial encounter: Secondary | ICD-10-CM

## 2019-07-24 MED ORDER — ONDANSETRON HCL 4 MG PO TABS: 4.0000 mg | ORAL_TABLET | Freq: Three times a day (TID) | ORAL | 0 refills | Status: DC | PRN

## 2019-07-24 MED ORDER — ONDANSETRON 4 MG PO TBDP
4.0000 mg | ORAL_TABLET | Freq: Once | ORAL | Status: AC
  Administered 2019-07-24: 14:00:00 4 mg via ORAL

## 2019-07-24 MED ORDER — IBUPROFEN 800 MG PO TABS: 800.0000 mg | ORAL_TABLET | Freq: Three times a day (TID) | ORAL | 0 refills | Status: DC | PRN

## 2019-07-24 MED ORDER — ACETAMINOPHEN 500 MG PO TABS: 500.0000 mg | ORAL_TABLET | Freq: Four times a day (QID) | ORAL | Status: DC

## 2019-07-24 NOTE — ED Triage Notes (Signed)
Pt c/o headache since yesterday when she hit her head on her cabinet at home. 2 xtra strength Tylenol at 815am. Also c/o nausea, dizziness and light sensitivity. No LOC at time of injury.
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
## 2020-01-24 DIAGNOSIS — Z1379 Encounter for other screening for genetic and chromosomal anomalies: Secondary | ICD-10-CM | POA: Insufficient documentation

## 2020-03-13 DIAGNOSIS — S069X9A Unspecified intracranial injury with loss of consciousness of unspecified duration, initial encounter: Secondary | ICD-10-CM

## 2020-03-13 HISTORY — DX: Unspecified intracranial injury with loss of consciousness of unspecified duration, initial encounter: S06.9X9A

## 2020-03-19 ENCOUNTER — Emergency Department (INDEPENDENT_AMBULATORY_CARE_PROVIDER_SITE_OTHER)
Admission: EM | Admit: 2020-03-19 | Discharge: 2020-03-19 | Disposition: A | Discharge status: Home / Self Care | Payer: Medicaid Other | Source: Home / Self Care | Attending: Emergency Medicine | Admitting: Emergency Medicine

## 2020-03-19 ENCOUNTER — Emergency Department (INDEPENDENT_AMBULATORY_CARE_PROVIDER_SITE_OTHER): Payer: Medicaid Other

## 2020-03-19 ENCOUNTER — Other Ambulatory Visit: Payer: Self-pay

## 2020-03-19 ENCOUNTER — Encounter: Payer: Self-pay | Admitting: *Deleted

## 2020-03-19 DIAGNOSIS — R519 Headache, unspecified: Secondary | ICD-10-CM

## 2020-03-19 DIAGNOSIS — R42 Dizziness and giddiness: Secondary | ICD-10-CM

## 2020-03-19 DIAGNOSIS — S060X1A Concussion with loss of consciousness of 30 minutes or less, initial encounter: Secondary | ICD-10-CM

## 2020-03-19 DIAGNOSIS — S161XXA Strain of muscle, fascia and tendon at neck level, initial encounter: Secondary | ICD-10-CM | POA: Diagnosis not present

## 2020-03-19 DIAGNOSIS — S0990XA Unspecified injury of head, initial encounter: Secondary | ICD-10-CM | POA: Diagnosis not present

## 2020-03-19 DIAGNOSIS — R41840 Attention and concentration deficit: Secondary | ICD-10-CM

## 2020-03-19 DIAGNOSIS — S060X9A Concussion with loss of consciousness of unspecified duration, initial encounter: Secondary | ICD-10-CM
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
## 2020-03-23 ENCOUNTER — Emergency Department (INDEPENDENT_AMBULATORY_CARE_PROVIDER_SITE_OTHER): Payer: Medicaid Other

## 2020-03-23 ENCOUNTER — Encounter: Payer: Self-pay | Admitting: Emergency Medicine

## 2020-03-23 ENCOUNTER — Other Ambulatory Visit: Payer: Self-pay

## 2020-03-23 ENCOUNTER — Emergency Department (INDEPENDENT_AMBULATORY_CARE_PROVIDER_SITE_OTHER)
Admission: EM | Admit: 2020-03-23 | Discharge: 2020-03-23 | Disposition: A | Discharge status: Home / Self Care | Payer: Medicaid Other | Source: Home / Self Care

## 2020-03-23 DIAGNOSIS — S161XXD Strain of muscle, fascia and tendon at neck level, subsequent encounter: Secondary | ICD-10-CM

## 2020-03-23 MED ORDER — ZIKS ARTHRITIS PAIN RELIEF 0.025-1-12 % EX CREA: 1.0000 "application " | TOPICAL_CREAM | Freq: Four times a day (QID) | CUTANEOUS | 0 refills | Status: DC | PRN

## 2020-03-23 MED ORDER — PREDNISONE 50 MG PO TABS: 50.0000 mg | ORAL_TABLET | Freq: Every day | ORAL | 0 refills | Status: AC

## 2020-03-23 MED ORDER — METHYLPREDNISOLONE SODIUM SUCC 40 MG IJ SOLR
80.0000 mg | Freq: Once | INTRAMUSCULAR | Status: AC
  Administered 2020-03-23: 80 mg via INTRAMUSCULAR
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
## 2020-06-19 DIAGNOSIS — N809 Endometriosis, unspecified: Secondary | ICD-10-CM | POA: Insufficient documentation

## 2020-06-19 HISTORY — DX: Endometriosis, unspecified: N80.9

## 2020-12-12 HISTORY — PX: SALPINGECTOMY: SHX328

## 2020-12-20 ENCOUNTER — Inpatient Hospital Stay (HOSPITAL_COMMUNITY): Payer: Medicaid Other

## 2020-12-20 ENCOUNTER — Encounter (HOSPITAL_COMMUNITY)
Admission: AD | Disposition: A | Discharge status: Home / Self Care | Payer: Self-pay | Source: Home / Self Care | Attending: Family Medicine

## 2020-12-20 ENCOUNTER — Inpatient Hospital Stay (HOSPITAL_COMMUNITY): Payer: Medicaid Other | Admitting: Anesthesiology

## 2020-12-20 ENCOUNTER — Inpatient Hospital Stay (HOSPITAL_COMMUNITY)
Admission: AD | Admit: 2020-12-20 | Discharge: 2020-12-20 | Disposition: A | Discharge status: Home / Self Care | Payer: Medicaid Other | Attending: Family Medicine | Admitting: Family Medicine

## 2020-12-20 ENCOUNTER — Encounter (HOSPITAL_COMMUNITY): Payer: Self-pay | Admitting: Family Medicine

## 2020-12-20 ENCOUNTER — Other Ambulatory Visit: Payer: Self-pay

## 2020-12-20 DIAGNOSIS — Z818 Family history of other mental and behavioral disorders: Secondary | ICD-10-CM | POA: Insufficient documentation

## 2020-12-20 DIAGNOSIS — Z3A01 Less than 8 weeks gestation of pregnancy: Secondary | ICD-10-CM | POA: Diagnosis not present

## 2020-12-20 DIAGNOSIS — Z810 Family history of intellectual disabilities: Secondary | ICD-10-CM | POA: Insufficient documentation

## 2020-12-20 DIAGNOSIS — Z91018 Allergy to other foods: Secondary | ICD-10-CM | POA: Diagnosis not present

## 2020-12-20 DIAGNOSIS — Z825 Family history of asthma and other chronic lower respiratory diseases: Secondary | ICD-10-CM | POA: Insufficient documentation

## 2020-12-20 DIAGNOSIS — Z791 Long term (current) use of non-steroidal anti-inflammatories (NSAID): Secondary | ICD-10-CM | POA: Insufficient documentation

## 2020-12-20 DIAGNOSIS — Z79899 Other long term (current) drug therapy: Secondary | ICD-10-CM | POA: Diagnosis not present

## 2020-12-20 DIAGNOSIS — O00102 Left tubal pregnancy without intrauterine pregnancy: Secondary | ICD-10-CM | POA: Insufficient documentation

## 2020-12-20 DIAGNOSIS — Z833 Family history of diabetes mellitus: Secondary | ICD-10-CM | POA: Insufficient documentation

## 2020-12-20 DIAGNOSIS — Z809 Family history of malignant neoplasm, unspecified: Secondary | ICD-10-CM | POA: Diagnosis not present

## 2020-12-20 DIAGNOSIS — Z91010 Allergy to peanuts: Secondary | ICD-10-CM | POA: Diagnosis not present

## 2020-12-20 DIAGNOSIS — O00202 Left ovarian pregnancy without intrauterine pregnancy: Secondary | ICD-10-CM | POA: Diagnosis not present

## 2020-12-20 DIAGNOSIS — Z8349 Family history of other endocrine, nutritional and metabolic diseases: Secondary | ICD-10-CM | POA: Diagnosis not present

## 2020-12-20 DIAGNOSIS — N939 Abnormal uterine and vaginal bleeding, unspecified: Secondary | ICD-10-CM

## 2020-12-20 DIAGNOSIS — Z888 Allergy status to other drugs, medicaments and biological substances status: Secondary | ICD-10-CM | POA: Diagnosis not present

## 2020-12-20 DIAGNOSIS — Z6790 Unspecified blood type, Rh positive: Secondary | ICD-10-CM

## 2020-12-20 DIAGNOSIS — Z20822 Contact with and (suspected) exposure to covid-19: Secondary | ICD-10-CM | POA: Insufficient documentation

## 2020-12-20 DIAGNOSIS — Z793 Long term (current) use of hormonal contraceptives: Secondary | ICD-10-CM | POA: Diagnosis not present

## 2020-12-20 DIAGNOSIS — R1030 Lower abdominal pain, unspecified: Secondary | ICD-10-CM

## 2020-12-20 DIAGNOSIS — Z91040 Latex allergy status: Secondary | ICD-10-CM | POA: Diagnosis not present

## 2020-12-20 DIAGNOSIS — E739 Lactose intolerance, unspecified: Secondary | ICD-10-CM | POA: Diagnosis not present

## 2020-12-20 HISTORY — PX: DIAGNOSTIC LAPAROSCOPY WITH REMOVAL OF ECTOPIC PREGNANCY: SHX6449

## 2020-12-20 LAB — URINALYSIS, ROUTINE W REFLEX MICROSCOPIC
Bilirubin Urine: NEGATIVE
Glucose, UA: NEGATIVE mg/dL
Hgb urine dipstick: NEGATIVE
Ketones, ur: NEGATIVE mg/dL
Leukocytes,Ua: NEGATIVE
Nitrite: NEGATIVE
Protein, ur: NEGATIVE mg/dL
Specific Gravity, Urine: 1.004 — ABNORMAL LOW (ref 1.005–1.030)
pH: 7 (ref 5.0–8.0)

## 2020-12-20 LAB — COMPREHENSIVE METABOLIC PANEL
ALT: 21 U/L (ref 0–44)
AST: 18 U/L (ref 15–41)
Albumin: 4.1 g/dL (ref 3.5–5.0)
Alkaline Phosphatase: 43 U/L (ref 38–126)
Anion gap: 6 (ref 5–15)
BUN: 11 mg/dL (ref 6–20)
CO2: 25 mmol/L (ref 22–32)
Calcium: 9.3 mg/dL (ref 8.9–10.3)
Chloride: 104 mmol/L (ref 98–111)
Creatinine, Ser: 0.61 mg/dL (ref 0.44–1.00)
GFR, Estimated: >60 mL/min (ref >60)
Glucose, Bld: 92 mg/dL (ref 70–99)
Potassium: 3.8 mmol/L (ref 3.5–5.1)
Sodium: 135 mmol/L (ref 135–145)
Total Bilirubin: 0.7 mg/dL (ref 0.3–1.2)
Total Protein: 6.9 g/dL (ref 6.5–8.1)

## 2020-12-20 LAB — RESP PANEL BY RT-PCR (FLU A&B, COVID) ARPGX2
Influenza A by PCR: NEGATIVE
Influenza B by PCR: NEGATIVE
SARS Coronavirus 2 by RT PCR: NEGATIVE

## 2020-12-20 LAB — CBC
HCT: 38.7 % (ref 36.0–46.0)
Hemoglobin: 13.4 g/dL (ref 12.0–15.0)
MCH: 29.3 pg (ref 26.0–34.0)
MCHC: 34.6 g/dL (ref 30.0–36.0)
MCV: 84.7 fL (ref 80.0–100.0)
Platelets: 297 10*3/uL (ref 150–400)
RBC: 4.57 MIL/uL (ref 3.87–5.11)
RDW: 11.9 % (ref 11.5–15.5)
WBC: 11 10*3/uL — ABNORMAL HIGH (ref 4.0–10.5)
nRBC: 0 % (ref 0.0–0.2)

## 2020-12-20 LAB — WET PREP, GENITAL
Clue Cells Wet Prep HPF POC: NONE SEEN
Sperm: NONE SEEN
Trich, Wet Prep: NONE SEEN
Yeast Wet Prep HPF POC: NONE SEEN

## 2020-12-20 LAB — HCG, QUANTITATIVE, PREGNANCY: hCG, Beta Chain, Quant, S: 329 m[IU]/mL — ABNORMAL HIGH (ref <5)

## 2020-12-20 LAB — POCT PREGNANCY, URINE: Preg Test, Ur: POSITIVE — AB

## 2020-12-20 LAB — GLUCOSE, CAPILLARY: Glucose-Capillary: 102 mg/dL — ABNORMAL HIGH (ref 70–99)

## 2020-12-20 SURGERY — LAPAROSCOPY, WITH ECTOPIC PREGNANCY SURGICAL TREATMENT
Anesthesia: General | Laterality: Left

## 2020-12-20 MED ORDER — BUPIVACAINE HCL (PF) 0.25 % IJ SOLN
INTRAMUSCULAR | Status: DC | PRN
  Administered 2020-12-20: 20 mL

## 2020-12-20 MED ORDER — MIDAZOLAM HCL 2 MG/2ML IJ SOLN
INTRAMUSCULAR | Status: AC
  Filled 2020-12-20: qty 2

## 2020-12-20 MED ORDER — FENTANYL CITRATE (PF) 250 MCG/5ML IJ SOLN
INTRAMUSCULAR | Status: AC
  Filled 2020-12-20: qty 5

## 2020-12-20 MED ORDER — HYDROMORPHONE HCL 1 MG/ML IJ SOLN
INTRAMUSCULAR | Status: AC
  Filled 2020-12-20: qty 0.5

## 2020-12-20 MED ORDER — DIPHENHYDRAMINE HCL 50 MG/ML IJ SOLN
INTRAMUSCULAR | Status: AC
  Filled 2020-12-20: qty 1

## 2020-12-20 MED ORDER — SODIUM CHLORIDE 0.9 % IR SOLN
Status: DC | PRN
  Administered 2020-12-20: 3000 mL

## 2020-12-20 MED ORDER — LACTATED RINGERS IV SOLN: INTRAVENOUS | Status: DC | PRN

## 2020-12-20 MED ORDER — LABETALOL HCL 5 MG/ML IV SOLN
INTRAVENOUS | Status: AC
  Filled 2020-12-20: qty 4

## 2020-12-20 MED ORDER — SUCCINYLCHOLINE CHLORIDE 20 MG/ML IJ SOLN
INTRAMUSCULAR | Status: DC | PRN
  Administered 2020-12-20: 120 mg via INTRAVENOUS

## 2020-12-20 MED ORDER — DEXAMETHASONE SODIUM PHOSPHATE 10 MG/ML IJ SOLN
INTRAMUSCULAR | Status: DC | PRN
  Administered 2020-12-20: 4 mg via INTRAVENOUS

## 2020-12-20 MED ORDER — MIDAZOLAM HCL 5 MG/5ML IJ SOLN
INTRAMUSCULAR | Status: DC | PRN
  Administered 2020-12-20: 2 mg via INTRAVENOUS

## 2020-12-20 MED ORDER — ACETAMINOPHEN 10 MG/ML IV SOLN
INTRAVENOUS | Status: DC | PRN
  Administered 2020-12-20: 1000 mg via INTRAVENOUS

## 2020-12-20 MED ORDER — DEXAMETHASONE SODIUM PHOSPHATE 10 MG/ML IJ SOLN
INTRAMUSCULAR | Status: AC
  Filled 2020-12-20: qty 1

## 2020-12-20 MED ORDER — FENTANYL CITRATE (PF) 250 MCG/5ML IJ SOLN
INTRAMUSCULAR | Status: DC | PRN
  Administered 2020-12-20 (×2): 100 ug via INTRAVENOUS
  Administered 2020-12-20: 50 ug via INTRAVENOUS

## 2020-12-20 MED ORDER — PROPOFOL 10 MG/ML IV BOLUS
INTRAVENOUS | Status: DC | PRN
  Administered 2020-12-20: 170 mg via INTRAVENOUS

## 2020-12-20 MED ORDER — KETOROLAC TROMETHAMINE 30 MG/ML IJ SOLN
INTRAMUSCULAR | Status: DC | PRN
  Administered 2020-12-20: 30 mg via INTRAVENOUS

## 2020-12-20 MED ORDER — SUGAMMADEX SODIUM 200 MG/2ML IV SOLN
INTRAVENOUS | Status: DC | PRN
  Administered 2020-12-20 (×2): 100 mg via INTRAVENOUS

## 2020-12-20 MED ORDER — ROCURONIUM BROMIDE 10 MG/ML (PF) SYRINGE
PREFILLED_SYRINGE | INTRAVENOUS | Status: AC
  Filled 2020-12-20: qty 10

## 2020-12-20 MED ORDER — HYDROMORPHONE HCL 1 MG/ML IJ SOLN
INTRAMUSCULAR | Status: DC | PRN
  Administered 2020-12-20: .5 mg via INTRAVENOUS

## 2020-12-20 MED ORDER — LIDOCAINE 2% (20 MG/ML) 5 ML SYRINGE
INTRAMUSCULAR | Status: DC | PRN
  Administered 2020-12-20: 80 mg via INTRAVENOUS

## 2020-12-20 MED ORDER — PROMETHAZINE HCL 25 MG/ML IJ SOLN: 6.2500 mg | INTRAMUSCULAR | Status: DC | PRN

## 2020-12-20 MED ORDER — ONDANSETRON HCL 4 MG/2ML IJ SOLN
INTRAMUSCULAR | Status: AC
  Filled 2020-12-20: qty 2

## 2020-12-20 MED ORDER — PROPOFOL 10 MG/ML IV BOLUS
INTRAVENOUS | Status: AC
  Filled 2020-12-20: qty 20

## 2020-12-20 MED ORDER — LIDOCAINE 2% (20 MG/ML) 5 ML SYRINGE
INTRAMUSCULAR | Status: AC
  Filled 2020-12-20: qty 5

## 2020-12-20 MED ORDER — HYDROMORPHONE HCL 1 MG/ML IJ SOLN
1.0000 mg | Freq: Once | INTRAMUSCULAR | Status: DC
  Filled 2020-12-20: qty 1

## 2020-12-20 MED ORDER — ROCURONIUM BROMIDE 10 MG/ML (PF) SYRINGE
PREFILLED_SYRINGE | INTRAVENOUS | Status: DC | PRN
  Administered 2020-12-20: 50 mg via INTRAVENOUS

## 2020-12-20 MED ORDER — LABETALOL HCL 5 MG/ML IV SOLN
10.0000 mg | Freq: Once | INTRAVENOUS | Status: AC
  Administered 2020-12-20: 10 mg via INTRAVENOUS

## 2020-12-20 MED ORDER — SUCCINYLCHOLINE CHLORIDE 200 MG/10ML IV SOSY
PREFILLED_SYRINGE | INTRAVENOUS | Status: AC
  Filled 2020-12-20: qty 10

## 2020-12-20 MED ORDER — OXYCODONE HCL 5 MG PO TABS: 5.0000 mg | ORAL_TABLET | Freq: Four times a day (QID) | ORAL | 0 refills | Status: DC | PRN

## 2020-12-20 MED ORDER — ONDANSETRON HCL 4 MG/2ML IJ SOLN
INTRAMUSCULAR | Status: DC | PRN
  Administered 2020-12-20: 4 mg via INTRAVENOUS

## 2020-12-20 MED ORDER — ACETAMINOPHEN 10 MG/ML IV SOLN
INTRAVENOUS | Status: AC
  Filled 2020-12-20: qty 100

## 2020-12-20 MED ORDER — BUPIVACAINE HCL (PF) 0.25 % IJ SOLN
INTRAMUSCULAR | Status: AC
  Filled 2020-12-20: qty 30

## 2020-12-20 MED ORDER — DIPHENHYDRAMINE HCL 50 MG/ML IJ SOLN
INTRAMUSCULAR | Status: DC | PRN
  Administered 2020-12-20: 12.5 mg via INTRAVENOUS

## 2020-12-20 MED ORDER — KETOROLAC TROMETHAMINE 30 MG/ML IJ SOLN
INTRAMUSCULAR | Status: AC
  Filled 2020-12-20: qty 1

## 2020-12-20 MED ORDER — FENTANYL CITRATE (PF) 100 MCG/2ML IJ SOLN: 25.0000 ug | INTRAMUSCULAR | Status: DC | PRN

## 2020-12-20 SURGICAL SUPPLY — 28 items
BAG SPEC RTRVL LRG 6X4 10 (ENDOMECHANICALS) ×1
BLADE SURG 15 STRL LF DISP TIS (BLADE) ×1 IMPLANT
BLADE SURG 15 STRL SS (BLADE) ×3
DEFOGGER SCOPE WARMER CLEARIFY (MISCELLANEOUS) ×3 IMPLANT
DRSG OPSITE POSTOP 3X4 (GAUZE/BANDAGES/DRESSINGS) ×3 IMPLANT
DURAPREP 26ML APPLICATOR (WOUND CARE) ×3 IMPLANT
GLOVE ECLIPSE 7.0 STRL STRAW (GLOVE) ×3 IMPLANT
GLOVE SURG UNDER POLY LF SZ7 (GLOVE) ×12 IMPLANT
GOWN STRL REUS W/ TWL LRG LVL3 (GOWN DISPOSABLE) ×3 IMPLANT
GOWN STRL REUS W/TWL LRG LVL3 (GOWN DISPOSABLE) ×9
KIT TURNOVER KIT B (KITS) ×3 IMPLANT
PACK LAPAROSCOPY BASIN (CUSTOM PROCEDURE TRAY) ×3 IMPLANT
PACK TRENDGUARD 450 HYBRID PRO (MISCELLANEOUS) ×1 IMPLANT
POUCH SPECIMEN RETRIEVAL 10MM (ENDOMECHANICALS) ×3 IMPLANT
PROTECTOR NERVE ULNAR (MISCELLANEOUS) ×6 IMPLANT
SET IRRIG TUBING LAPAROSCOPIC (IRRIGATION / IRRIGATOR) ×3 IMPLANT
SET TUBE SMOKE EVAC HIGH FLOW (TUBING) ×3 IMPLANT
SHEARS HARMONIC ACE PLUS 36CM (ENDOMECHANICALS) ×3 IMPLANT
SLEEVE ENDOPATH XCEL 5M (ENDOMECHANICALS) ×3 IMPLANT
SUT VIC AB 3-0 X1 27 (SUTURE) ×3 IMPLANT
SUT VICRYL 0 UR6 27IN ABS (SUTURE) ×6 IMPLANT
TOWEL GREEN STERILE FF (TOWEL DISPOSABLE) ×6 IMPLANT
TRAY FOLEY W/BAG SLVR 14FR (SET/KITS/TRAYS/PACK) ×3 IMPLANT
TRENDGUARD 450 HYBRID PRO PACK (MISCELLANEOUS) ×3
TROCAR BALLN 12MMX100 BLUNT (TROCAR) ×3 IMPLANT
TROCAR XCEL NON-BLD 11X100MML (ENDOMECHANICALS) ×3 IMPLANT
TROCAR XCEL NON-BLD 5MMX100MML (ENDOMECHANICALS) ×3 IMPLANT
WARMER LAPAROSCOPE (MISCELLANEOUS) ×3 IMPLANT

## 2020-12-20 NOTE — MAU Note (Signed)
Pt reports to mau with c/o lower abd pain that started a few hours ago.  Pt also reports some vag spotting that started this morning.

## 2020-12-20 NOTE — Anesthesia Procedure Notes (Signed)
Procedure Name: Intubation Date/Time: 12/20/2020 6:43 PM Performed by: Adria Dill, CRNA Pre-anesthesia Checklist: Patient identified, Emergency Drugs available, Suction available and Patient being monitored Patient Re-evaluated:Patient Re-evaluated prior to induction Oxygen Delivery Method: Circle system utilized Preoxygenation: Pre-oxygenation with 100% oxygen Induction Type: IV induction Ventilation: Mask ventilation without difficulty Laryngoscope Size: Miller and 2 Grade View: Grade I Tube type: Oral Tube size: 7.0 mm Number of attempts: 1 Airway Equipment and Method: Stylet and Oral airway Placement Confirmation: ETT inserted through vocal cords under direct vision,  positive ETCO2 and breath sounds checked- equal and bilateral Secured at: 21 cm Tube secured with: Tape Dental Injury: Teeth and Oropharynx as per pre-operative assessment

## 2020-12-20 NOTE — Discharge Instructions (Signed)
Ectopic Laparoscopy, Care After The following information offers guidance on how to care for yourself after your procedure. Your health care provider may also give you more specific instructions. If you have problems or questions, contact your health care provider. What can I expect after the procedure? After the procedure, it is common to have:  Mild discomfort in the abdomen.  Sore throat. Women who have laparoscopy with a pelvic examination may have mild cramping and fluid coming from the vagina for a few days after the procedure. Follow these instructions at home: Medicines  Take over-the-counter and prescription medicines only as told by your health care provider.  If you were prescribed an antibiotic medicine, take it as told by your health care provider. Do not stop taking the antibiotic even if you start to feel better.  Ask your health care provider if the medicine prescribed to you: ? Requires you to avoid driving or using machinery. ? Can cause constipation. You may need to take these actions to prevent or treat constipation:  Drink enough fluid to keep your urine pale yellow.  Take over-the-counter or prescription medicines.  Eat foods that are high in fiber, such as beans, whole grains, and fresh fruits and vegetables.  Limit foods that are high in fat and processed sugars, such as fried or sweet foods. Incision care  Follow instructions from your health care provider about how to take care of your incisions. Make sure you: ? Wash your hands with soap and water for at least 20 seconds before and after you change your bandage (dressing). If soap and water are not available, use hand sanitizer. ? Change your dressing as told by your health care provider. ? Leave stitches (sutures), skin glue, or surgical tape in place. These skin closures may need to stay in place for 2 weeks or longer. If surgical tape edges start to loosen and curl up, you may trim the loose edges. Do not  remove the surgical tape completely unless your health care provider tells you to do that.  Check your incision areas every day for signs of infection. Check for: ? Redness, swelling, or pain. ? Fluid or blood. ? Warmth. ? Pus or a bad smell.   Activity  Return to your normal activities as told by your health care provider. Ask your health care provider what activities are safe for you.  Do not lift anything that is heavier than 10 lb (4.5 kg), or the limit that you are told, until your health care provider says that it is safe.  Avoid sitting for a long time without moving. Get up to take short walks every 1-2 hours. This is important to improve blood flow and breathing. Ask for help if you feel weak or unsteady. General instructions  Do not use any products that contain nicotine or tobacco. These products include cigarettes, chewing tobacco, and vaping devices, such as e-cigarettes. If you need help quitting, ask your health care provider.  If you were given a sedative during the procedure, it can affect you for several hours. Do not drive or operate machinery until your health care provider says that it is safe.  Do not take baths, swim, or use a hot tub until your health care provider approves. Ask your health care provider if you may take showers. You may only be allowed to take sponge baths.  Keep all follow-up visits. This is important. Contact a health care provider if:  You develop shoulder pain.  You feel light-headed or   faint.  You are unable to pass gas or have a bowel movement.  You feel nauseous or you vomit.  You develop a rash.  You have any of these signs of infection: ? Redness, swelling, or pain around an incision. ? Fluid or blood coming from an incision. ? Warmth coming from an incision. ? Pus or a bad smell coming from an incision. ? A fever or chills. Get help right away if:  You have severe pain.  You have vomiting that does not go away.  You  have heavy bleeding from the vagina.  Any incision opens up.  You have trouble breathing.  You have chest pain. These symptoms may represent a serious problem that is an emergency. Do not wait to see if the symptoms will go away. Get medical help right away. Call your local emergency services (911 in the [place] Do not drive yourself to the hospital. Summary  After the procedure, it is common to have mild discomfort in the abdomen and a sore throat.  Check your incision areas every day for signs of infection.  Return to your normal activities as told by your health care provider. Ask your health care provider what activities are safe for you. This information is not intended to replace advice given to you by your health care provider. Make sure you discuss any questions you have with your health care provider. Document Revised: 04/25/2020 Document Reviewed: 04/25/2020 Elsevier Patient Education  2021 [employer].

## 2020-12-20 NOTE — MAU Note (Signed)
Pt is requesting pain medication-for abdominal pain

## 2020-12-20 NOTE — Anesthesia Preprocedure Evaluation (Addendum)
Anesthesia Evaluation  Patient identified by MRN, date of birth, ID band Patient awake    Reviewed: Allergy & Precautions, NPO status , Patient's Chart, lab work & pertinent test results  Airway Mallampati: I  TM Distance: >3 FB Neck ROM: Full    Dental  (+) Teeth Intact, Dental Advisory Given   Pulmonary neg pulmonary ROS,    Pulmonary exam normal breath sounds clear to auscultation       Cardiovascular negative cardio ROS Normal cardiovascular exam Rhythm:Regular Rate:Normal     Neuro/Psych negative neurological ROS     GI/Hepatic negative GI ROS, Neg liver ROS,   Endo/Other  negative endocrine ROS  Renal/GU negative Renal ROS     Musculoskeletal negative musculoskeletal ROS (+)   Abdominal   Peds  (+) ADHD Hematology negative hematology ROS (+)   Anesthesia Other Findings   Reproductive/Obstetrics (+) Pregnancy ( ectopic pregnancy) H/o c-section                             Anesthesia Physical Anesthesia Plan  ASA: II and emergent  Anesthesia Plan: General   Post-op Pain Management:    Induction: Intravenous, Rapid sequence and Cricoid pressure planned  PONV Risk Score and Plan: 4 or greater and Midazolam, Dexamethasone, Ondansetron and Treatment may vary due to age or medical condition  Airway Management Planned: Oral ETT  Additional Equipment:   Intra-op Plan:   Post-operative Plan: Extubation in OR  Informed Consent: I have reviewed the patients History and Physical, chart, labs and discussed the procedure including the risks, benefits and alternatives for the proposed anesthesia with the patient or authorized representative who has indicated his/her understanding and acceptance.     Dental advisory given  Plan Discussed with: CRNA  Anesthesia Plan Comments:        Anesthesia Quick Evaluation

## 2020-12-20 NOTE — MAU Note (Signed)
In to administer pt requested medication for pain. Pt stated she did not want a narcotic for pain "because it makes her feel loopy" Pt stated she would just wait until her surgery

## 2020-12-20 NOTE — H&P (Signed)
Susan Krause is an 30 y.o. G2P1001 female.   Chief Complaint: RLQ abdominal pain HPI: Patient is here for lower abdominal pain. Pain began 1130 am. Has tried tylenol. Pain is worse with movement. U/s reveals a probable left ectopic pregnancy.  Past Medical History:  Diagnosis Date  . ADHD (attention deficit hyperactivity disorder)    No meds  . Cervical dysplasia   . Chlamydia   . Migraine    Propanolol 10 mg  . PTSD (post-traumatic stress disorder)    no meds  . PVC's (premature ventricular contractions) 2018    Past Surgical History:  Procedure Laterality Date  . CESAREAN SECTION N/A 07/18/2018   Procedure: CESAREAN SECTION;  Surgeon: Essie Hart, MD;  Location: [facility];  Service: Obstetrics;  Laterality: N/A;  . WISDOM TOOTH EXTRACTION      Family History  Problem Relation Age of Onset  . ADD / ADHD Mother   . Anxiety disorder Mother   . Depression Mother   . Hyperlipidemia Mother   . Obesity Mother   . ADD / ADHD Father   . Anxiety disorder Sister   . Asthma Sister   . Depression Sister   . Intellectual disability Brother   . Learning disabilities Brother   . ADD / ADHD Sister   . Anxiety disorder Maternal Aunt   . Diabetes Maternal Aunt   . Alcohol abuse Maternal Uncle   . Hyperlipidemia Maternal Uncle   . Anxiety disorder Paternal Uncle   . Diabetes Paternal Uncle   . Anxiety disorder Maternal Grandmother   . Diabetes Maternal Grandmother   . Hyperlipidemia Maternal Grandmother   . Varicose Veins Maternal Grandmother   . Cancer Maternal Grandfather   . Hyperlipidemia Maternal Grandfather   . Obesity Maternal Grandfather   . Vision loss Maternal Grandfather   . Cancer Paternal Grandmother    Social History:  reports that she has never smoked. She has never used smokeless tobacco. She reports that she does not drink alcohol and does not use drugs.  Allergies:  Allergies  Allergen Reactions  . Contrast Media [Iodinated Diagnostic Agents] Nausea And  Vomiting  . Gardasil [Hpv 4-Valent Vaccine Recombinant Vaccine]   . Peanut Oil Swelling    Tongue swells/itching  . Pumpkin Flavor Swelling  . Banana Swelling  . Hpv Bival (Type 16,18) Recomb Vaccine  [Human Papillomavirus (16,18) Recomb Vac] Hives  . Lactose Intolerance (Gi)   . Latex Swelling    Medications Prior to Admission  Medication Sig Dispense Refill  . acetaminophen (TYLENOL) 500 MG tablet Take 1 tablet (500 mg total) by mouth every 6 (six) hours.    Marland Kitchen labetalol (NORMODYNE) 100 MG tablet Take 100 mg by mouth 2 (two) times daily.    . Capsaicin-Menthol-Methyl Sal (CAPSAICIN-METHYL SAL-MENTHOL) 0.025-1-12 % CREA Apply 1 application topically 4 (four) times daily as needed (muscle tightness/soreness). 56.6 g 0  . cyclobenzaprine (FLEXERIL) 10 MG tablet Take 10 mg by mouth 2 (two) times daily as needed.    Marland Kitchen ibuprofen (ADVIL) 800 MG tablet Take 1 tablet (800 mg total) by mouth every 8 (eight) hours as needed for headache or moderate pain. 21 tablet 0  . levonorgestrel-ethinyl estradiol (SEASONALE) 0.15-0.03 MG tablet Take 1 tablet by mouth daily.    Marland Kitchen MICROGESTIN 1-20 MG-MCG tablet Take 1 tablet by mouth daily.      Pertinent items are noted in HPI.  Blood pressure 132/84, pulse 90, temperature (!) 97.5 F (36.4 C), temperature source Oral, resp. rate 17,  last menstrual period 11/14/2020, SpO2 100 %, currently breastfeeding. BP 132/84 (BP Location: Right Arm)   Pulse 90   Temp (!) 97.5 F (36.4 C) (Oral)   Resp 17   LMP 11/14/2020   SpO2 100%  General appearance: alert, cooperative and appears stated age Neck: supple, symmetrical, trachea midline Lungs: normal effort Heart: regular rate and rhythm Abdomen: soft, + rebound, + tenderness Extremities: Homans sign is negative, no sign of DVT Skin: Skin color, texture, turgor normal. No rashes or lesions Neurologic: Grossly normal   Lab Results  Component Value Date   WBC 11.0 (H) 12/20/2020   HGB 13.4 12/20/2020   HCT  38.7 12/20/2020   MCV 84.7 12/20/2020   PLT 297 12/20/2020   Lab Results  Component Value Date   PREGTESTUR POSITIVE (A) 12/20/2020   O pos  Assessment/Plan Active Problems:   Ectopic pregnancy of left ovary  For laparoscopic removal Needs COVID test. Risks include but are not limited to bleeding, infection, injury to surrounding structures, including bowel, bladder and ureters, blood clots, and death.  Likelihood of success is high.    [name] 12/20/2020, 4:10 PM

## 2020-12-20 NOTE — Progress Notes (Signed)
Pt was stable and in no distress. Pt was out of Phase II at 2040 waiting for mom to arrive at hospital to pick her and her boyfriend Minerva Areola up.  She denied pain or needs. Ate crackers and drank juice while waiting for mom to arrive. Assisted into car at 2125 with boyfriend at side for assistance. 12/20/2020 @ 2130 Manson Allan, RN

## 2020-12-20 NOTE — Transfer of Care (Signed)
Immediate Anesthesia Transfer of Care Note  Patient: Susan Krause  Procedure(s) Performed: LAPAROSCOPIC LEFT SALPINGECTOMY WITH REMOVAL OF ECTOPIC PREGNANCY (Left )  Patient Location: PACU  Anesthesia Type:General  Level of Consciousness: awake and patient cooperative  Airway & Oxygen Therapy: Patient Spontanous Breathing and Patient connected to nasal cannula oxygen  Post-op Assessment: Report given to RN and Post -op Vital signs reviewed and stable  Post vital signs: Reviewed and stable  Last Vitals:  Vitals Value Taken Time  BP 153/116 12/20/20 1941  Temp    Pulse 129 12/20/20 1948  Resp 22 12/20/20 1948  SpO2 98 % 12/20/20 1948  Vitals shown include unvalidated device data.  Last Pain:  Vitals:   12/20/20 1814  TempSrc: Oral  PainSc:       Patients Stated Pain Goal: 0 (12/20/20 1323)  Complications: No complications documented.

## 2020-12-20 NOTE — MAU Note (Signed)
RN from main OR called- transport will be sent for patient.

## 2020-12-20 NOTE — Op Note (Signed)
PREOPERATIVE DIAGNOSIS: Probable ruptured ectopic pregnancy  POSTOPERATIVE DIAGNOSIS: Same  PROCEDURE: Laparoscopic left salpingectomy   INDICATIONS: 30 y.o. G2P1001 at [redacted]w[redacted]d here for with ruptured ectopic pregnancy with blood type O pos. Patient was counseled regarding need for laparoscopic salpingectomy. Risks of surgery including bleeding which may require transfusion or reoperation, infection, injury to bowel or other surrounding organs, need for additional procedures including laparotomy and other postoperative/anesthesia complications were explained to patient.  Written informed consent was obtained  FINDINGS: small amount of hemoperitoneum estimated to be about 25 cc of blood and clots.  Dilated left fallopian tube containing ectopic gestation. Small normal appearing uterus, normal right fallopian tube, right ovary and left ovary.  ANESTHESIA: General  SPECIMENS: left fallopian tube to pathology  COMPLICATIONS: None immediate  PROCEDURE IN DETAIL:  The patient was taken to the operating room where general anesthesia was administered and was found to be adequate.  She was placed in the dorsal lithotomy position, and was prepped and draped in a sterile manner.  A Foley catheter was inserted into her bladder and attached to constant drainage and a uterine manipulator was then advanced into the uterus .  After an adequate timeout was performed, attention was then turned to the patient's abdomen where a 10-mm skin incision was made in the umbilical fold. Fascia and peritoneum were entered sharply.  A 0 Vicryl suture was used to tag the fascia circumferentially.  A Hassan trocar was placed. The laparoscope was introduced.  A survey of the patient's pelvis and abdomen revealed the findings as above.  Two left lower quadrant ports were placed under direct visualization; 5-mm x 2.  Attention was then turned to the left fallopian tube which was grasped and ligated from the underlying mesosalpinx and  uterine attachment using the Harmonic instrument.  Good hemostasis was noted.  The specimen was placed in an EndoCatch bag and removed from the abdomen intact. Clot and blood removed with the Nezjhat. The abdomen was desufflated, and all instruments were removed.  The umbilicus incision was closed with the afore mentioned Vicryl suture; and all skin incisions were closed with a 3-0 Vicryl subcuticular stitch followed by DermaBond. The patient tolerated the procedure well.  All instruments, needles, and sponge counts were correct x 2. The patient was taken to the recovery room in stable condition.   [name] MD 12/20/2020 7:31 PM

## 2020-12-20 NOTE — MAU Provider Note (Signed)
History     CSN: [id number]  Arrival date and time: 12/20/20 1254   Event Date/Time   First Provider Initiated Contact with Patient 12/20/20 1350      Chief Complaint  Patient presents with  . Abdominal Pain  . Vaginal Bleeding   [name] is a 30 y.o. G2P1001 at [redacted]w[redacted]d who presents to MAU for vaginal bleeding which began this morning around 1130AM. Patient reports she last ate 3 eggs around 10AM this morning and then developed sudden onset lower abdominal/pelvic pain around 1130AM this morning and saw some spotting on the toilet tissue after using the bathroom. Patient reports she took two extra strength Tylenol around that time with a small amount of water, but the Tylenol did not help her pain. Patient reports the pain "feels like a continuous menstrual cramp" and reports the pain is 7/10 when lying still, but it a 10/10 with any movement. Patient also reports a history of endometriosis and reports with her previous pregnancy she felt pain in her back in early pregnancy, but never in her abdomen like this. Patient endorses the pain does radiate around to her back and down her leg as well. Patient reports lying still is the only thing that makes the pain better.  Of note, patient reports she went to the [facility] ED last night for sternal chest pain and had elevated BP there because she had not yet taken labetalol as her migraine preventive. Patient reports she left before a complete work up was performed because she did not feel they were taking her pregnancy in to consideration. Patient denies chest pain at this time.  Pt denies vaginal discharge/odor/itching. Pt denies N/V, constipation, diarrhea, or urinary problems. Pt denies fever, chills, fatigue, sweating or changes in appetite. Pt denies SOB or chest pain. Pt denies dizziness, HA, light-headedness, weakness.   OB History    Gravida  2   Para  1   Term  1   Preterm      AB      Living  1     SAB      IAB       Ectopic      Multiple  0   Live Births  1           Past Medical History:  Diagnosis Date  . ADHD (attention deficit hyperactivity disorder)    No meds  . Cervical dysplasia   . Chlamydia   . Migraine    Propanolol 10 mg  . PTSD (post-traumatic stress disorder)    no meds  . PVC's (premature ventricular contractions) 2018    Past Surgical History:  Procedure Laterality Date  . CESAREAN SECTION N/A 07/18/2018   Procedure: CESAREAN SECTION;  Surgeon: Essie Hart, MD;  Location: [facility];  Service: Obstetrics;  Laterality: N/A;  . WISDOM TOOTH EXTRACTION      Family History  Problem Relation Age of Onset  . ADD / ADHD Mother   . Anxiety disorder Mother   . Depression Mother   . Hyperlipidemia Mother   . Obesity Mother   . ADD / ADHD Father   . Anxiety disorder Sister   . Asthma Sister   . Depression Sister   . Intellectual disability Brother   . Learning disabilities Brother   . ADD / ADHD Sister   . Anxiety disorder Maternal Aunt   . Diabetes Maternal Aunt   . Alcohol abuse Maternal Uncle   . Hyperlipidemia Maternal Uncle   .  Anxiety disorder Paternal Uncle   . Diabetes Paternal Uncle   . Anxiety disorder Maternal Grandmother   . Diabetes Maternal Grandmother   . Hyperlipidemia Maternal Grandmother   . Varicose Veins Maternal Grandmother   . Cancer Maternal Grandfather   . Hyperlipidemia Maternal Grandfather   . Obesity Maternal Grandfather   . Vision loss Maternal Grandfather   . Cancer Paternal Grandmother     Social History   Tobacco Use  . Smoking status: Never Smoker  . Smokeless tobacco: Never Used  Vaping Use  . Vaping Use: Never used  Substance Use Topics  . Alcohol use: No  . Drug use: No    Allergies:  Allergies  Allergen Reactions  . Contrast Media [Iodinated Diagnostic Agents] Nausea And Vomiting  . Gardasil [Hpv 4-Valent Vaccine Recombinant Vaccine]   . Peanut Oil Swelling    Tongue swells/itching  . Pumpkin Flavor  Swelling  . Banana Swelling  . Hpv Bival (Type 16,18) Recomb Vaccine  [Human Papillomavirus (16,18) Recomb Vac] Hives  . Lactose Intolerance (Gi)   . Latex Swelling    Medications Prior to Admission  Medication Sig Dispense Refill Last Dose  . acetaminophen (TYLENOL) 500 MG tablet Take 1 tablet (500 mg total) by mouth every 6 (six) hours.   12/20/2020 at Unknown time  . labetalol (NORMODYNE) 100 MG tablet Take 100 mg by mouth 2 (two) times daily.   12/20/2020 at Unknown time  . Capsaicin-Menthol-Methyl Sal (CAPSAICIN-METHYL SAL-MENTHOL) 0.025-1-12 % CREA Apply 1 application topically 4 (four) times daily as needed (muscle tightness/soreness). 56.6 g 0   . cyclobenzaprine (FLEXERIL) 10 MG tablet Take 10 mg by mouth 2 (two) times daily as needed.     Marland Kitchen ibuprofen (ADVIL) 800 MG tablet Take 1 tablet (800 mg total) by mouth every 8 (eight) hours as needed for headache or moderate pain. 21 tablet 0   . levonorgestrel-ethinyl estradiol (SEASONALE) 0.15-0.03 MG tablet Take 1 tablet by mouth daily.     Marland Kitchen MICROGESTIN 1-20 MG-MCG tablet Take 1 tablet by mouth daily.       Review of Systems  Constitutional: Negative for chills, diaphoresis, fatigue and fever.  Eyes: Negative for visual disturbance.  Respiratory: Negative for shortness of breath.   Cardiovascular: Negative for chest pain.  Gastrointestinal: Positive for abdominal pain. Negative for constipation, diarrhea, nausea and vomiting.  Genitourinary: Positive for pelvic pain and vaginal bleeding. Negative for dysuria, flank pain, frequency, urgency and vaginal discharge.  Neurological: Negative for dizziness, weakness, light-headedness and headaches.   Physical Exam   Blood pressure 132/84, pulse 90, temperature (!) 97.5 F (36.4 C), temperature source Oral, resp. rate 17, last menstrual period 11/14/2020, SpO2 100 %, currently breastfeeding.  Patient Vitals for the past 24 hrs:  BP Temp Temp src Pulse Resp SpO2  12/20/20 1328 132/84 (!)  97.5 F (36.4 C) Oral 90 17 100 %   Physical Exam Vitals and nursing note reviewed.  Constitutional:      General: She is not in acute distress.    Appearance: Normal appearance. She is ill-appearing. She is not toxic-appearing or diaphoretic.  HENT:     Head: Normocephalic and atraumatic.  Pulmonary:     Effort: Pulmonary effort is normal.  Neurological:     Mental Status: She is alert and oriented to person, place, and time.  Psychiatric:        Mood and Affect: Mood normal.        Behavior: Behavior normal.  Thought Content: Thought content normal.        Judgment: Judgment normal.    Results for orders placed or performed during the hospital encounter of 12/20/20 (from the past 24 hour(s))  Pregnancy, urine POC     Status: Abnormal   Collection Time: 12/20/20  1:11 PM  Result Value Ref Range   Preg Test, Ur POSITIVE (A) NEGATIVE  CBC     Status: Abnormal   Collection Time: 12/20/20  1:57 PM  Result Value Ref Range   WBC 11.0 (H) 4.0 - 10.5 K/uL   RBC 4.57 3.87 - 5.11 MIL/uL   Hemoglobin 13.4 12.0 - 15.0 g/dL   HCT 16.138.7 09.636.0 - 04.546.0 %   MCV 84.7 80.0 - 100.0 fL   MCH 29.3 26.0 - 34.0 pg   MCHC 34.6 30.0 - 36.0 g/dL   RDW 40.911.9 81.111.5 - 91.415.5 %   Platelets 297 150 - 400 K/uL   nRBC 0.0 0.0 - 0.2 %  Comprehensive metabolic panel     Status: None   Collection Time: 12/20/20  1:57 PM  Result Value Ref Range   Sodium 135 135 - 145 mmol/L   Potassium 3.8 3.5 - 5.1 mmol/L   Chloride 104 98 - 111 mmol/L   CO2 25 22 - 32 mmol/L   Glucose, Bld 92 70 - 99 mg/dL   BUN 11 6 - 20 mg/dL   Creatinine, Ser 7.820.61 0.44 - 1.00 mg/dL   Calcium 9.3 8.9 - 95.610.3 mg/dL   Total Protein 6.9 6.5 - 8.1 g/dL   Albumin 4.1 3.5 - 5.0 g/dL   AST 18 15 - 41 U/L   ALT 21 0 - 44 U/L   Alkaline Phosphatase 43 38 - 126 U/L   Total Bilirubin 0.7 0.3 - 1.2 mg/dL   GFR, Estimated >21>60 >30>60 mL/min   Anion gap 6 5 - 15  hCG, quantitative, pregnancy     Status: Abnormal   Collection Time: 12/20/20   1:57 PM  Result Value Ref Range   hCG, Beta Chain, Quant, S 329 (H) <5 mIU/mL  Urinalysis, Routine w reflex microscopic Urine, Clean Catch     Status: Abnormal   Collection Time: 12/20/20  2:55 PM  Result Value Ref Range   Color, Urine STRAW (A) YELLOW   APPearance CLEAR CLEAR   Specific Gravity, Urine 1.004 (L) 1.005 - 1.030   pH 7.0 5.0 - 8.0   Glucose, UA NEGATIVE NEGATIVE mg/dL   Hgb urine dipstick NEGATIVE NEGATIVE   Bilirubin Urine NEGATIVE NEGATIVE   Ketones, ur NEGATIVE NEGATIVE mg/dL   Protein, ur NEGATIVE NEGATIVE mg/dL   Nitrite NEGATIVE NEGATIVE   Leukocytes,Ua NEGATIVE NEGATIVE  Wet prep, genital     Status: Abnormal   Collection Time: 12/20/20  2:55 PM   Specimen: Vaginal  Result Value Ref Range   Yeast Wet Prep HPF POC NONE SEEN NONE SEEN   Trich, Wet Prep NONE SEEN NONE SEEN   Clue Cells Wet Prep HPF POC NONE SEEN NONE SEEN   WBC, Wet Prep HPF POC FEW (A) NONE SEEN   Sperm NONE SEEN    US OB LESS THAN 14 WEEKS WITH OB TRANSVAGINAL  Result Date: 12/20/2020 CLINICAL DATA:  30 year old with LMP 11/14/2020 (5 weeks 1 day), presenting with vaginal bleeding and LEFT-sided pelvic pain. Quantitative beta hCG 329. EXAM: OBSTETRIC <14 WK US AND TRANSVAGINAL OB US TECHNIQUE: Both transabdominal and transvaginal ultrasound examinations were performed for complete evaluation of the gestation as well as  the maternal uterus, adnexal regions, and pelvic cul-de-sac. Transvaginal technique was performed to assess early pregnancy. COMPARISON:  None. FINDINGS: Intrauterine gestational sac: Absent. Yolk sac:  Absent. Embryo:  Absent. Cardiac Activity: Not applicable. MSD: Not applicable. CRL:  Not applicable. [place] EDC: Not applicable. Subchorionic hemorrhage:  Absent. Maternal uterus/adnexae: Thickened endometrium up to 1.2 cm. No endometrial fluid. Moderate free fluid in the cul-de-sac. Heterogeneous mass involving the LEFT adnexa extending into the cul-de-sac measuring approximately 4.9 x 2.3  x 3.3 cm with internal color Doppler flow. The ultrasound technologist states that the patient had severe tenderness to examination in this location. Normal appearing RIGHT ovary measuring approximately 2.9 x 1.8 x 2.6 cm (volume 7.4 mL). Normal appearing LEFT ovary measuring approximately 2.1 x 1.5 x 1.5 cm (volume 2.4 mL). IMPRESSION: 1. No evidence of an intrauterine gestational sac. 2. Possible LEFT adnexal ectopic pregnancy. There is a heterogeneous mass in the LEFT adnexum extending into the cul-de-sac. Critical value/emergent results were called by telephone at the time of interpretation on 12/20/2020 at 3:45 p.m. to provider Ryan Ogborn, NP, who verbally acknowledged these results. Electronically Signed   By: Hulan Saas M.D.   On: 12/20/2020 15:48    MAU Course  Procedures  MDM -r/o ectopic -UA: straw/SG1.004, otherwise WNL -CBC: no abnormalities requiring treatment -CMP: WNL -[place]: likely ruptured ectopic present in cul-de-sac, called from radiology with results -hCG: 329 -ABO: O Positive -WetPrep: WNL -GC/CT collected -reviewed results with Dr. Shawnie Pons who goes to bedside to see patient to discuss results -prepare for OR  Orders Placed This Encounter  Procedures  . Wet prep, genital    Standing Status:   Standing    Number of Occurrences:   1  . Resp Panel by RT-PCR (Flu A&B, Covid) Nasopharyngeal Swab    Standing Status:   Standing    Number of Occurrences:   1    Order Specific Question:   Is this test for diagnosis or screening    Answer:   Screening    Order Specific Question:   Symptomatic for COVID-19 as defined by CDC    Answer:   No    Order Specific Question:   Hospitalized for COVID-19    Answer:   No    Order Specific Question:   Admitted to ICU for COVID-19    Answer:   No    Order Specific Question:   Previously tested for COVID-19    Answer:   No    Order Specific Question:   Resident in a congregate (group) care setting    Answer:   Unknown    Order  Specific Question:   Employed in healthcare setting    Answer:   Unknown    Order Specific Question:   Pregnant    Answer:   Yes    Order Specific Question:   Has patient completed COVID vaccination(s) (2 doses of Pfizer/Moderna 1 dose of [employer])    Answer:   Unknown  . US OB LESS THAN 14 WEEKS WITH OB TRANSVAGINAL    Standing Status:   Standing    Number of Occurrences:   1    Order Specific Question:   Symptom/Reason for Exam    Answer:   Vaginal spotting [[id number]]    Order Specific Question:   Symptom/Reason for Exam    Answer:   Lower abdominal pain [342600]  . Urinalysis, Routine w reflex microscopic Urine, Clean Catch    Standing Status:   Standing  Number of Occurrences:   1  . CBC    Standing Status:   Standing    Number of Occurrences:   1  . Comprehensive metabolic panel    Standing Status:   Standing    Number of Occurrences:   1  . hCG, quantitative, pregnancy    Standing Status:   Standing    Number of Occurrences:   1  . Airborne and Contact precautions    Standing Status:   Standing    Number of Occurrences:   1  . Pregnancy, urine POC    Standing Status:   Standing    Number of Occurrences:   1   No orders of the defined types were placed in this encounter.  Assessment and Plan   1. Ectopic pregnancy of left ovary   2. Vaginal spotting   3. Lower abdominal pain   4. Blood type, Rh positive    -prepare for OR  Susan Krause 12/20/2020, 4:09 PM

## 2020-12-20 NOTE — Anesthesia Postprocedure Evaluation (Signed)
Anesthesia Post Note  Patient: [name]  Procedure(s) Performed: LAPAROSCOPIC LEFT SALPINGECTOMY WITH REMOVAL OF ECTOPIC PREGNANCY (Left )     Patient location during evaluation: PACU Anesthesia Type: General Level of consciousness: awake and alert Pain management: pain level controlled Vital Signs Assessment: post-procedure vital signs reviewed and stable Respiratory status: spontaneous breathing, nonlabored ventilation, respiratory function stable and patient connected to nasal cannula oxygen Cardiovascular status: blood pressure returned to baseline, stable and tachycardic Postop Assessment: no apparent nausea or vomiting Anesthetic complications: no   No complications documented.  Last Vitals:  Vitals:   12/20/20 1940 12/20/20 1955  BP: (!) 153/116 (!) 147/104  Pulse:  (!) 121  Resp: 15 15  Temp: (!) 36.3 C   SpO2: 100% 94%    Last Pain:  Vitals:   12/20/20 1955  TempSrc:   PainSc: 4                  Cecile Hearing

## 2020-12-21 ENCOUNTER — Encounter (HOSPITAL_COMMUNITY): Payer: Self-pay | Admitting: Family Medicine

## 2020-12-22 LAB — GC/CHLAMYDIA PROBE AMP (~~LOC~~) NOT AT ARMC
Chlamydia: NEGATIVE
Comment: NEGATIVE
Comment: NORMAL
Neisseria Gonorrhea: NEGATIVE

## 2020-12-23 ENCOUNTER — Inpatient Hospital Stay (HOSPITAL_COMMUNITY)
Admission: AD | Admit: 2020-12-23 | Discharge: 2020-12-23 | Disposition: A | Discharge status: Home / Self Care | Payer: Medicaid Other | Attending: Obstetrics & Gynecology | Admitting: Obstetrics & Gynecology

## 2020-12-23 ENCOUNTER — Other Ambulatory Visit: Payer: Self-pay

## 2020-12-23 ENCOUNTER — Inpatient Hospital Stay (HOSPITAL_COMMUNITY): Payer: Medicaid Other

## 2020-12-23 DIAGNOSIS — Z9889 Other specified postprocedural states: Secondary | ICD-10-CM

## 2020-12-23 DIAGNOSIS — N939 Abnormal uterine and vaginal bleeding, unspecified: Secondary | ICD-10-CM | POA: Diagnosis not present

## 2020-12-23 DIAGNOSIS — Z48816 Encounter for surgical aftercare following surgery on the genitourinary system: Secondary | ICD-10-CM

## 2020-12-23 LAB — CBC
HCT: 36.1 % (ref 36.0–46.0)
Hemoglobin: 12.4 g/dL (ref 12.0–15.0)
MCH: 29.8 pg (ref 26.0–34.0)
MCHC: 34.3 g/dL (ref 30.0–36.0)
MCV: 86.8 fL (ref 80.0–100.0)
Platelets: 311 10*3/uL (ref 150–400)
RBC: 4.16 MIL/uL (ref 3.87–5.11)
RDW: 12 % (ref 11.5–15.5)
WBC: 7.6 10*3/uL (ref 4.0–10.5)
nRBC: 0 % (ref 0.0–0.2)

## 2020-12-23 NOTE — MAU Provider Note (Signed)
History     CSN: [id number]  Arrival date and time: 12/23/20 1551   Event Date/Time   First Provider Initiated Contact with Patient 12/23/20 1638      Chief Complaint  Patient presents with  . Chest Pain  . Vaginal Bleeding   [name] is a 30 y.o. G2P1001 at s/p left salpingectomy who presents to MAU for vaginal bleeding. Patient reports she was told after her surgery that she would have bleeding that is less than a period. Patient reports her bleeding during her periods is extremely light, so the bleeding she is currently experiencing is more than a period. Patient reports occasional golf ball-sized clots and bleeding that is filling the toilet and "gushing out." Patient reports having to change pads much more frequently than during her period. Patient reports these symptoms along with right rib pain, started the day after her surgery. Patient also reports she had loose stool today x1, but denies watery diarrhea, and reports eating a cheeseburger with fries around 1130AM this morning. Patient last had water around 330PM when she took Tylenol. Patient has not taken anything else for pain other than Tylenol today and reports she has a personal preference for not taking the narcotic medication she was prescribed.   OB History    Gravida  2   Para  1   Term  1   Preterm      AB      Living  1     SAB      IAB      Ectopic      Multiple  0   Live Births  1           Past Medical History:  Diagnosis Date  . ADHD (attention deficit hyperactivity disorder)    No meds  . Cervical dysplasia   . Chlamydia   . Migraine    Propanolol 10 mg  . PTSD (post-traumatic stress disorder)    no meds  . PVC's (premature ventricular contractions) 2018    Past Surgical History:  Procedure Laterality Date  . CESAREAN SECTION N/A 07/18/2018   Procedure: CESAREAN SECTION;  Surgeon: Essie Hart, MD;  Location: [facility] BIRTHING SUITES;  Service: Obstetrics;  Laterality: N/A;  .  DIAGNOSTIC LAPAROSCOPY WITH REMOVAL OF ECTOPIC PREGNANCY Left 12/20/2020   Procedure: LAPAROSCOPIC LEFT SALPINGECTOMY WITH REMOVAL OF ECTOPIC PREGNANCY;  Surgeon: [name], MD;  Location: [facility] OR;  Service: Gynecology;  Laterality: Left;  . WISDOM TOOTH EXTRACTION      Family History  Problem Relation Age of Onset  . ADD / ADHD Mother   . Anxiety disorder Mother   . Depression Mother   . Hyperlipidemia Mother   . Obesity Mother   . ADD / ADHD Father   . Anxiety disorder Sister   . Asthma Sister   . Depression Sister   . Intellectual disability Brother   . Learning disabilities Brother   . ADD / ADHD Sister   . Anxiety disorder Maternal Aunt   . Diabetes Maternal Aunt   . Alcohol abuse Maternal Uncle   . Hyperlipidemia Maternal Uncle   . Anxiety disorder Paternal Uncle   . Diabetes Paternal Uncle   . Anxiety disorder Maternal Grandmother   . Diabetes Maternal Grandmother   . Hyperlipidemia Maternal Grandmother   . Varicose Veins Maternal Grandmother   . Cancer Maternal Grandfather   . Hyperlipidemia Maternal Grandfather   . Obesity Maternal Grandfather   . Vision loss Maternal Grandfather   .  Cancer Paternal Grandmother     Social History   Tobacco Use  . Smoking status: Never Smoker  . Smokeless tobacco: Never Used  Vaping Use  . Vaping Use: Never used  Substance Use Topics  . Alcohol use: No  . Drug use: No    Allergies:  Allergies  Allergen Reactions  . Banana Swelling  . Latex Swelling  . Contrast Media [Iodinated Diagnostic Agents] Nausea And Vomiting  . Gardasil [Hpv 4-Valent Vaccine Recombinant Vaccine]   . Hpv Bival (Type 16,18) Recomb Vaccine  [Human Papillomavirus (16,18) Recomb Vac] Hives  . Peanut Oil Swelling    Tongue swells/itching  . Pumpkin Flavor Swelling  . Lactose Intolerance (Gi)     Medications Prior to Admission  Medication Sig Dispense Refill Last Dose  . acetaminophen (TYLENOL) 500 MG tablet Take 1 tablet (500 mg total) by  mouth every 6 (six) hours.   12/23/2020 at 1510  . labetalol (NORMODYNE) 100 MG tablet Take 100 mg by mouth 2 (two) times daily.   12/22/2020 at Unknown time  . oxyCODONE (OXY IR/ROXICODONE) 5 MG immediate release tablet Take 1 tablet (5 mg total) by mouth every 6 (six) hours as needed for severe pain. 8 tablet 0 12/22/2020 at 1430    Review of Systems  Constitutional: Negative for chills, diaphoresis, fatigue and fever.  Eyes: Negative for visual disturbance.  Respiratory: Negative for shortness of breath.   Cardiovascular: Negative for chest pain.  Gastrointestinal: Negative for abdominal pain, constipation, diarrhea, nausea and vomiting.       Right rib pain  Genitourinary: Positive for vaginal bleeding. Negative for dysuria, flank pain, frequency, pelvic pain, urgency and vaginal discharge.  Neurological: Negative for dizziness, weakness, light-headedness and headaches.   Physical Exam   Blood pressure 125/76, pulse 89, temperature 98.6 F (37 C), temperature source Oral, resp. rate 16, last menstrual period 11/14/2020, SpO2 100 %, currently breastfeeding.  Patient Vitals for the past 24 hrs:  BP Temp Temp src Pulse Resp SpO2  12/23/20 1624 125/76 98.6 F (37 C) Oral 89 -- 100 %  12/23/20 1606 129/89 99.3 F (37.4 C) Oral 89 16 100 %   Physical Exam Vitals and nursing note reviewed.  Constitutional:      General: She is not in acute distress.    Appearance: Normal appearance. She is not ill-appearing, toxic-appearing or diaphoretic.  HENT:     Head: Normocephalic and atraumatic.  Pulmonary:     Effort: Pulmonary effort is normal.  Abdominal:     Comments: Honeycomb dressing still present - clean, dry, intact, no evidence of bleeding or bruising. Sites in LLQ appear well-healing, clean, dry, no bruising, bleeding or drainage. Incision appears to be healing well without s/sx of infection.  Skin:    General: Skin is warm and dry.  Neurological:     Mental Status: She is alert  and oriented to person, place, and time.  Psychiatric:        Mood and Affect: Mood normal.        Behavior: Behavior normal.        Thought Content: Thought content normal.        Judgment: Judgment normal.    Results for orders placed or performed during the hospital encounter of 12/23/20 (from the past 24 hour(s))  CBC     Status: None   Collection Time: 12/23/20  4:26 PM  Result Value Ref Range   WBC 7.6 4.0 - 10.5 K/uL   RBC 4.16 3.87 -  5.11 MIL/uL   Hemoglobin 12.4 12.0 - 15.0 g/dL   HCT 16.1 09.6 - 04.5 %   MCV 86.8 80.0 - 100.0 fL   MCH 29.8 26.0 - 34.0 pg   MCHC 34.3 30.0 - 36.0 g/dL   RDW 40.9 81.1 - 91.4 %   Platelets 311 150 - 400 K/uL   nRBC 0.0 0.0 - 0.2 %   US PELVIC COMPLETE WITH TRANSVAGINAL  Result Date: 12/23/2020 CLINICAL DATA:  Vaginal bleeding status post left salpingectomy. EXAM: TRANSABDOMINAL AND TRANSVAGINAL ULTRASOUND OF PELVIS TECHNIQUE: Both transabdominal and transvaginal ultrasound examinations of the pelvis were performed. Transabdominal technique was performed for global imaging of the pelvis including uterus, ovaries, adnexal regions, and pelvic cul-de-sac. It was necessary to proceed with endovaginal exam following the transabdominal exam to visualize the endometrium. COMPARISON:  December 20, 2020. FINDINGS: Uterus Measurements: 8.4 x 5.6 x 3.6 cm = volume: 88 mL. No fibroids or other mass visualized. Endometrium Thickness: 6 mm which is within normal limits. There is noted some increased vascularity involving the endometrium near the Caesarean section scar. Right ovary Measurements: 3.0 x 3.2 x 1.8 cm = volume: 9 mL. Normal appearance/no adnexal mass. Left ovary Measurements: 2.3 x 3.6 x 2.0 cm = volume: 9 mL. Normal appearance/no adnexal mass. Other findings Small amount of free fluid is which most likely is postoperative in etiology. IMPRESSION: Endometrial thickness is within normal limits. There is noted focal vascularity involving the endometrium near  the Caesarean section scar which may represent prominent vasculature or vessels. Electronically Signed   By: Lupita Raider M.D.   On: 12/23/2020 17:49   US OB LESS THAN 14 WEEKS WITH OB TRANSVAGINAL  Result Date: 12/20/2020 CLINICAL DATA:  30 year old with LMP 11/14/2020 (5 weeks 1 day), presenting with vaginal bleeding and LEFT-sided pelvic pain. Quantitative beta hCG 329. EXAM: OBSTETRIC <14 WK [place] AND TRANSVAGINAL OB US TECHNIQUE: Both transabdominal and transvaginal ultrasound examinations were performed for complete evaluation of the gestation as well as the maternal uterus, adnexal regions, and pelvic cul-de-sac. Transvaginal technique was performed to assess early pregnancy. COMPARISON:  None. FINDINGS: Intrauterine gestational sac: Absent. Yolk sac:  Absent. Embryo:  Absent. Cardiac Activity: Not applicable. MSD: Not applicable. CRL:  Not applicable. [place] EDC: Not applicable. Subchorionic hemorrhage:  Absent. Maternal uterus/adnexae: Thickened endometrium up to 1.2 cm. No endometrial fluid. Moderate free fluid in the cul-de-sac. Heterogeneous mass involving the LEFT adnexa extending into the cul-de-sac measuring approximately 4.9 x 2.3 x 3.3 cm with internal color Doppler flow. The ultrasound technologist states that the patient had severe tenderness to examination in this location. Normal appearing RIGHT ovary measuring approximately 2.9 x 1.8 x 2.6 cm (volume 7.4 mL). Normal appearing LEFT ovary measuring approximately 2.1 x 1.5 x 1.5 cm (volume 2.4 mL). IMPRESSION: 1. No evidence of an intrauterine gestational sac. 2. Possible LEFT adnexal ectopic pregnancy. There is a heterogeneous mass in the LEFT adnexum extending into the cul-de-sac. Critical value/emergent results were called by telephone at the time of interpretation on 12/20/2020 at 3:45 p.m. to provider Malikah Principato, NP, who verbally acknowledged these results. Electronically Signed   By: Hulan Saas M.D.   On: 12/20/2020 15:48    MAU  Course  Procedures  MDM -minimal bleeding while in MAU -VSS -CBC: WNL -[place]: WNL, Dr. Jolayne Panther reviewed images with MAU provider and consulted on case and reports these are normal expectations s/p ectopic surgery and patient can be discharged home -pt discharged to home in stable condition  Orders Placed This Encounter  Procedures  . US PELVIC COMPLETE WITH TRANSVAGINAL    Standing Status:   Standing    Number of Occurrences:   1    Order Specific Question:   Symptom/Reason for Exam    Answer:   Episode of heavy vaginal bleeding [[id number]]  . CBC    Standing Status:   Standing    Number of Occurrences:   1  . Diet NPO time specified    Standing Status:   Standing    Number of Occurrences:   1  . Discharge patient    Order Specific Question:   Discharge disposition    Answer:   01-Home or Self Care [1]    Order Specific Question:   Discharge patient date    Answer:   12/23/2020   No orders of the defined types were placed in this encounter.  Assessment and Plan   1. Vaginal bleeding   2. Episode of heavy vaginal bleeding   3. Post-operative state     Allergies as of 12/23/2020      Reactions   Banana Swelling   Latex Swelling   Contrast Media [iodinated Diagnostic Agents] Nausea And Vomiting   Gardasil [hpv 4-valent Vaccine Recombinant Vaccine]    Hpv Bival (type 16,18) Recomb Vaccine  [human Papillomavirus (16,18) Recomb Vac] Hives   Peanut Oil Swelling   Tongue swells/itching   Pumpkin Flavor Swelling   Lactose Intolerance (gi)       Medication List    TAKE these medications   acetaminophen 500 MG tablet Commonly known as: Tylenol Take 1 tablet (500 mg total) by mouth every 6 (six) hours.   labetalol 100 MG tablet Commonly known as: NORMODYNE Take 100 mg by mouth 2 (two) times daily.   oxyCODONE 5 MG immediate release tablet Commonly known as: Oxy IR/ROXICODONE Take 1 tablet (5 mg total) by mouth every 6 (six) hours as needed for severe pain.       -return MAU precautions given -pt discharged to home in stable condition  Joni Reiningicole E Ronda Rajkumar 12/23/2020, 6:20 PM

## 2020-12-23 NOTE — MAU Note (Signed)
Susan Krause is a 30 y.o. at [redacted]w[redacted]d here in MAU reporting: heavy vaginal bleeding since going home from the surgery, states bleeding would saturate a whole pad but she has been going to the bathroom and states the toilet is full of blood. Also having right sided rib pain.  Onset of complaint: ongoing  Pain score: 6/10  Vitals:   12/23/20 1606  BP: 129/89  Pulse: 89  Resp: 16  Temp: 99.3 F (37.4 C)  SpO2: 100%     Lab orders placed from triage: none

## 2020-12-24 LAB — SURGICAL PATHOLOGY

## 2021-01-07 ENCOUNTER — Ambulatory Visit: Payer: Medicaid Other | Admitting: Family Medicine

## 2021-01-08 ENCOUNTER — Telehealth (INDEPENDENT_AMBULATORY_CARE_PROVIDER_SITE_OTHER): Payer: Medicaid Other | Admitting: Family Medicine

## 2021-01-08 ENCOUNTER — Other Ambulatory Visit: Payer: Self-pay

## 2021-01-08 DIAGNOSIS — Z09 Encounter for follow-up examination after completed treatment for conditions other than malignant neoplasm: Secondary | ICD-10-CM

## 2021-01-08 NOTE — Progress Notes (Signed)
I connected with  [name] on 01/08/21 at  4:15 PM EDT by telephone and verified that I am speaking with the correct person using two identifiers.   I discussed the limitations, risks, security and privacy concerns of performing an evaluation and management service by telephone and the availability of in person appointments. I also discussed with the patient that there may be a patient responsible charge related to this service. The patient expressed understanding and agreed to proceed.  Scheryl Marten, RN 01/08/2021  4:09 PM   Belly button still hurts

## 2021-01-12 ENCOUNTER — Telehealth: Payer: Self-pay | Admitting: *Deleted

## 2021-01-12 NOTE — Telephone Encounter (Signed)
Called pt back regarding her message, patient actually had a video visit this AM with her PC and she showed them her belly button and said it was infected and gave her some antibiotics for that and her URI. Informed pt that if it is not better after antibiotics to let us know so we can get her seen.

## 2021-01-12 NOTE — Telephone Encounter (Signed)
-----   Message from Rozann Lesches, NT sent at 01/12/2021  8:07 AM EDT ----- Regarding: patient stated bellybutton is infection Patient called stating that she is certain that her belly button is infected. She stated that it stays wet inside the belly button, has terrible odor, red and has pain. She states that the other two incisions are fine.

## 2021-01-14 ENCOUNTER — Telehealth: Payer: Self-pay | Admitting: *Deleted

## 2021-01-14 ENCOUNTER — Encounter: Payer: Self-pay | Admitting: Family Medicine

## 2021-01-14 NOTE — Telephone Encounter (Signed)
Called pt to let her know she can continue the augmentin.

## 2021-01-14 NOTE — Progress Notes (Signed)
GYNECOLOGY VIRTUAL VISIT ENCOUNTER NOTE  Provider location: [facility] for [facility]   Patient location: Home  I connected with [name] on 01/14/21 at  4:15 PM EDT by MyChart Video Encounter and verified that I am speaking with the correct person using two identifiers.   I discussed the limitations, risks, security and privacy concerns of performing an evaluation and management service virtually and the availability of in person appointments. I also discussed with the patient that there may be a patient responsible charge related to this service. The patient expressed understanding and agreed to proceed.   History:  Susan Krause is a 30 y.o. G54P1001 female being evaluated today for postop check following ectopic removal. She denies any abnormal vaginal discharge, bleeding, pelvic pain or other concerns.       Past Medical History:  Diagnosis Date  . ADHD (attention deficit hyperactivity disorder)    No meds  . Cervical dysplasia   . Chlamydia   . Migraine    Propanolol 10 mg  . PTSD (post-traumatic stress disorder)    no meds  . PVC's (premature ventricular contractions) 2018   Past Surgical History:  Procedure Laterality Date  . CESAREAN SECTION N/A 07/18/2018   Procedure: CESAREAN SECTION;  Surgeon: Essie Hart, MD;  Location: [facility] BIRTHING SUITES;  Service: Obstetrics;  Laterality: N/A;  . DIAGNOSTIC LAPAROSCOPY WITH REMOVAL OF ECTOPIC PREGNANCY Left 12/20/2020   Procedure: LAPAROSCOPIC LEFT SALPINGECTOMY WITH REMOVAL OF ECTOPIC PREGNANCY;  Surgeon: [name], MD;  Location: [facility] OR;  Service: Gynecology;  Laterality: Left;  . WISDOM TOOTH EXTRACTION     The following portions of the patient's history were reviewed and updated as appropriate: allergies, current medications, past family history, past medical history, past social history, past surgical history and problem list.   Health Maintenance:  Needs pap, to schedule to come in for this.  Review of  Systems:  Pertinent items noted in HPI and remainder of comprehensive ROS otherwise negative.  Physical Exam:   General:  Alert, oriented and cooperative. Patient appears to be in no acute distress.  Mental Status: Normal mood and affect. Normal behavior. Normal judgment and thought content.   Respiratory: Normal respiratory effort, no problems with respiration noted  Rest of physical exam deferred due to type of encounter  Labs and Imaging No results found for this or any previous visit (from the past 336 hour(s)). US PELVIC COMPLETE WITH TRANSVAGINAL  Result Date: 12/23/2020 CLINICAL DATA:  Vaginal bleeding status post left salpingectomy. EXAM: TRANSABDOMINAL AND TRANSVAGINAL ULTRASOUND OF PELVIS TECHNIQUE: Both transabdominal and transvaginal ultrasound examinations of the pelvis were performed. Transabdominal technique was performed for global imaging of the pelvis including uterus, ovaries, adnexal regions, and pelvic cul-de-sac. It was necessary to proceed with endovaginal exam following the transabdominal exam to visualize the endometrium. COMPARISON:  December 20, 2020. FINDINGS: Uterus Measurements: 8.4 x 5.6 x 3.6 cm = volume: 88 mL. No fibroids or other mass visualized. Endometrium Thickness: 6 mm which is within normal limits. There is noted some increased vascularity involving the endometrium near the Caesarean section scar. Right ovary Measurements: 3.0 x 3.2 x 1.8 cm = volume: 9 mL. Normal appearance/no adnexal mass. Left ovary Measurements: 2.3 x 3.6 x 2.0 cm = volume: 9 mL. Normal appearance/no adnexal mass. Other findings Small amount of free fluid is which most likely is postoperative in etiology. IMPRESSION: Endometrial thickness is within normal limits. There is noted focal vascularity involving the endometrium near the  Caesarean section scar which may represent prominent vasculature or vessels. Electronically Signed   By: Lupita Raider M.D.   On: 12/23/2020 17:49   US OB LESS THAN  14 WEEKS WITH OB TRANSVAGINAL  Result Date: 12/20/2020 CLINICAL DATA:  30 year old with LMP 11/14/2020 (5 weeks 1 day), presenting with vaginal bleeding and LEFT-sided pelvic pain. Quantitative beta hCG 329. EXAM: OBSTETRIC <14 WK [place] AND TRANSVAGINAL OB US TECHNIQUE: Both transabdominal and transvaginal ultrasound examinations were performed for complete evaluation of the gestation as well as the maternal uterus, adnexal regions, and pelvic cul-de-sac. Transvaginal technique was performed to assess early pregnancy. COMPARISON:  None. FINDINGS: Intrauterine gestational sac: Absent. Yolk sac:  Absent. Embryo:  Absent. Cardiac Activity: Not applicable. MSD: Not applicable. CRL:  Not applicable. [place] EDC: Not applicable. Subchorionic hemorrhage:  Absent. Maternal uterus/adnexae: Thickened endometrium up to 1.2 cm. No endometrial fluid. Moderate free fluid in the cul-de-sac. Heterogeneous mass involving the LEFT adnexa extending into the cul-de-sac measuring approximately 4.9 x 2.3 x 3.3 cm with internal color Doppler flow. The ultrasound technologist states that the patient had severe tenderness to examination in this location. Normal appearing RIGHT ovary measuring approximately 2.9 x 1.8 x 2.6 cm (volume 7.4 mL). Normal appearing LEFT ovary measuring approximately 2.1 x 1.5 x 1.5 cm (volume 2.4 mL). IMPRESSION: 1. No evidence of an intrauterine gestational sac. 2. Possible LEFT adnexal ectopic pregnancy. There is a heterogeneous mass in the LEFT adnexum extending into the cul-de-sac. Critical value/emergent results were called by telephone at the time of interpretation on 12/20/2020 at 3:45 p.m. to provider NICOLE NUGENT, NP, who verbally acknowledged these results. Electronically Signed   By: Hulan Saas M.D.   On: 12/20/2020 15:48       Assessment and Plan:     Postop check - Per her report, healing well. Reviewed pathology, findings at surgery.        I discussed the assessment and treatment plan with  the patient. The patient was provided an opportunity to ask questions and all were answered. The patient agreed with the plan and demonstrated an understanding of the instructions.   The patient was advised to call back or seek an in-person evaluation/go to the ED if the symptoms worsen or if the condition fails to improve as anticipated.  I provided 7 minutes of face-to-face time during this encounter.  Return in about 4 weeks (around 02/05/2021) for a CPE.  [name], MD Center for [employer], [facility]

## 2021-01-14 NOTE — Telephone Encounter (Signed)
-----   Message from Rozann Lesches, NT sent at 01/13/2021  8:54 AM EDT ----- Regarding: RE: patient stated bellybutton is infection Spoke with patient this morning who stated that she went to her PCP for an upper respiratory infection. Doctor looked at it and said that it was inflamed and infected, sent in Augmentin for the upper respiratory infection which patient is taken. She wanted me to ask if she needed to take the Kelfex in addition to the Augmentin?    ----- Message ----- From: [name], MD Sent: 01/12/2021   4:47 PM EDT To: Rozann Lesches, NT, Clovis Riley, RN Subject: RE: patient stated bellybutton is infection    Call in Keflex 500'tid x 7 days.use peroxide three times daily.schedule in person with me next week ----- Message ----- From: Rozann Lesches, NT Sent: 01/12/2021   8:09 AM EDT To: [name], MD, Clovis Riley, RN Subject: patient stated bellybutton is infection        Patient called stating that she is certain that her belly button is infected. She stated that it stays wet inside the belly button, has terrible odor, red and has pain. She states that the other two incisions are fine.

## 2021-01-22 ENCOUNTER — Encounter: Payer: Self-pay | Admitting: Family Medicine

## 2021-01-22 ENCOUNTER — Ambulatory Visit (INDEPENDENT_AMBULATORY_CARE_PROVIDER_SITE_OTHER): Payer: Medicaid Other | Admitting: Family Medicine

## 2021-01-22 ENCOUNTER — Other Ambulatory Visit: Payer: Self-pay

## 2021-01-22 ENCOUNTER — Other Ambulatory Visit (HOSPITAL_COMMUNITY)
Admission: RE | Admit: 2021-01-22 | Discharge: 2021-01-22 | Disposition: A | Discharge status: Home / Self Care | Payer: Medicaid Other | Source: Ambulatory Visit | Attending: Family Medicine | Admitting: Family Medicine

## 2021-01-22 VITALS — BP 107/73 | HR 80 | Ht 62.0 in | Wt 174.8 lb

## 2021-01-22 DIAGNOSIS — Z124 Encounter for screening for malignant neoplasm of cervix: Secondary | ICD-10-CM

## 2021-01-22 DIAGNOSIS — Z01419 Encounter for gynecological examination (general) (routine) without abnormal findings: Secondary | ICD-10-CM

## 2021-01-22 DIAGNOSIS — O00202 Left ovarian pregnancy without intrauterine pregnancy: Secondary | ICD-10-CM

## 2021-01-22 DIAGNOSIS — Z01411 Encounter for gynecological examination (general) (routine) with abnormal findings: Secondary | ICD-10-CM

## 2021-01-22 NOTE — Progress Notes (Signed)
  Subjective:     Susan Krause is a 30 y.o. female and is here for a comprehensive physical exam. The patient reports no problems. Recent surgery for ectopic, with umbilical infection. Placed on Augmentin, and area looks better. Not interested in contraception at this time. Last pap 2021 and ASCUS with neg HPV.     The following portions of the patient's history were reviewed and updated as appropriate: allergies, current medications, past family history, past medical history, past social history, past surgical history and problem list.  Review of Systems Pertinent items noted in HPI and remainder of comprehensive ROS otherwise negative.   Objective:    BP 107/73   Pulse 80   Ht 5\' 2"  (1.575 m)   Wt 174 lb 12.8 oz (79.3 kg)   LMP 01/21/2021 (Exact Date)   Breastfeeding No   BMI 31.97 kg/m  General appearance: alert, cooperative and appears stated age Head: Normocephalic, without obvious abnormality, atraumatic Neck: supple, symmetrical, trachea midline Lungs: normal effort Heart: regular rate and rhythm Abdomen: soft, non-tender; bowel sounds normal; no masses,  no organomegaly and incision is well healed Pelvic: cervix normal in appearance, external genitalia normal, no adnexal masses or tenderness, no cervical motion tenderness, uterus normal size, shape, and consistency and vagina normal without discharge Extremities: extremities normal, atraumatic, no cyanosis or edema Skin: Skin color, texture, turgor normal. No rashes or lesions Neurologic: Grossly normal    Assessment:    Healthy female exam.      Plan:     Ectopic pregnancy of left ovary  Screening for malignant neoplasm of cervix - Plan: Cytology - PAP  Encounter for gynecological examination without abnormal finding  Return in 1 year (on 01/22/2022).\ See After Visit Summary for Counseling Recommendations

## 2021-01-22 NOTE — Patient Instructions (Signed)
Preventive Care 11-28 Years Old, Female Preventive care refers to lifestyle choices and visits with your health care provider that can promote health and wellness. This includes:  A yearly physical exam. This is also called an annual wellness visit.  Regular dental and eye exams.  Immunizations.  Screening for certain conditions.  Healthy lifestyle choices, such as: ? Eating a healthy diet. ? Getting regular exercise. ? Not using drugs or products that contain nicotine and tobacco. ? Limiting alcohol use. What can I expect for my preventive care visit? Physical exam Your health care provider may check your:  Height and weight. These may be used to calculate your BMI (body mass index). BMI is a measurement that tells if you are at a healthy weight.  Heart rate and blood pressure.  Body temperature.  Skin for abnormal spots. Counseling Your health care provider may ask you questions about your:  Past medical problems.  Family's medical history.  Alcohol, tobacco, and drug use.  Emotional well-being.  Home life and relationship well-being.  Sexual activity.  Diet, exercise, and sleep habits.  Work and work Statistician.  Access to firearms.  Method of birth control.  Menstrual cycle.  Pregnancy history. What immunizations do I need? Vaccines are usually given at various ages, according to a schedule. Your health care provider will recommend vaccines for you based on your age, medical history, and lifestyle or other factors, such as travel or where you work.   What tests do I need? Blood tests  Lipid and cholesterol levels. These may be checked every 5 years starting at age 64.  Hepatitis C test.  Hepatitis B test. Screening  Diabetes screening. This is done by checking your blood sugar (glucose) after you have not eaten for a while (fasting).  STD (sexually transmitted disease) testing, if you are at risk.  BRCA-related cancer screening. This may  be done if you have a family history of breast, ovarian, tubal, or peritoneal cancers.  Pelvic exam and Pap test. This may be done every 3 years starting at age 61. Starting at age 82, this may be done every 5 years if you have a Pap test in combination with an HPV test. Talk with your health care provider about your test results, treatment options, and if necessary, the need for more tests.   Follow these instructions at home: Eating and drinking  Eat a healthy diet that includes fresh fruits and vegetables, whole grains, lean protein, and low-fat dairy products.  Take vitamin and mineral supplements as recommended by your health care provider.  Do not drink alcohol if: ? Your health care provider tells you not to drink. ? You are pregnant, may be pregnant, or are planning to become pregnant.  If you drink alcohol: ? Limit how much you have to 0-1 drink a day. ? Be aware of how much alcohol is in your drink. In the [place] one drink equals one 12 oz bottle of beer (355 mL), one 5 oz glass of wine (148 mL), or one 1 oz glass of hard liquor (44 mL).   Lifestyle  Take daily care of your teeth and gums. Brush your teeth every morning and night with fluoride toothpaste. Floss one time each day.  Stay active. Exercise for at least 30 minutes 5 or more days each week.  Do not use any products that contain nicotine or tobacco, such as cigarettes, e-cigarettes, and chewing tobacco. If you need help quitting, ask your health care provider.  Do  not use drugs.  If you are sexually active, practice safe sex. Use a condom or other form of protection to prevent STIs (sexually transmitted infections).  If you do not wish to become pregnant, use a form of birth control. If you plan to become pregnant, see your health care provider for a prepregnancy visit.  Find healthy ways to cope with stress, such as: ? Meditation, yoga, or listening to music. ? Journaling. ? Talking to a trusted  person. ? Spending time with friends and family. Safety  Always wear your seat belt while driving or riding in a vehicle.  Do not drive: ? If you have been drinking alcohol. Do not ride with someone who has been drinking. ? When you are tired or distracted. ? While texting.  Wear a helmet and other protective equipment during sports activities.  If you have firearms in your house, make sure you follow all gun safety procedures.  Seek help if you have been physically or sexually abused. What's next?  Go to your health care provider once a year for an annual wellness visit.  Ask your health care provider how often you should have your eyes and teeth checked.  Stay up to date on all vaccines. This information is not intended to replace advice given to you by your health care provider. Make sure you discuss any questions you have with your health care provider. Document Revised: 04/27/2020 Document Reviewed: 05/11/2018 Elsevier Patient Education  2021 [employer].

## 2021-01-22 NOTE — Progress Notes (Signed)
Tdap 2019

## 2021-01-27 LAB — CYTOLOGY - PAP: Diagnosis: NEGATIVE

## 2021-02-15 ENCOUNTER — Other Ambulatory Visit: Payer: Self-pay

## 2021-02-15 ENCOUNTER — Emergency Department
Admission: EM | Admit: 2021-02-15 | Discharge: 2021-02-15 | Disposition: A | Discharge status: Home / Self Care | Payer: Medicaid Other | Source: Home / Self Care

## 2021-02-15 DIAGNOSIS — H65112 Acute and subacute allergic otitis media (mucoid) (sanguinous) (serous), left ear: Secondary | ICD-10-CM

## 2021-02-15 MED ORDER — AMOXICILLIN-POT CLAVULANATE 250-125 MG PO TABS: 1.0000 | ORAL_TABLET | Freq: Two times a day (BID) | ORAL | 0 refills | Status: DC

## 2021-02-15 MED ORDER — AMOXICILLIN-POT CLAVULANATE 875-125 MG PO TABS: 1.0000 | ORAL_TABLET | Freq: Two times a day (BID) | ORAL | 0 refills | Status: DC
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
## 2021-02-15 NOTE — ED Triage Notes (Addendum)
Pt presents to Urgent Care with c/o cough and nasal congestion x 6 days. Tested positive for COVID (home test) on 02/11/21. Now she is having pain in her L ear and left side of throat. Pt unvaccinated, but this is her second time w/ COVID.

## 2021-02-15 NOTE — Discharge Instructions (Addendum)
Continue using Flonase 1-2 times a day

## 2021-05-11 ENCOUNTER — Telehealth: Payer: Self-pay | Admitting: *Deleted

## 2021-05-11 ENCOUNTER — Other Ambulatory Visit: Payer: Self-pay

## 2021-05-11 ENCOUNTER — Other Ambulatory Visit: Payer: Self-pay | Admitting: Obstetrics & Gynecology

## 2021-05-11 ENCOUNTER — Ambulatory Visit: Payer: Medicaid Other | Admitting: *Deleted

## 2021-05-11 DIAGNOSIS — O3680X0 Pregnancy with inconclusive fetal viability, not applicable or unspecified: Secondary | ICD-10-CM

## 2021-05-11 LAB — HCG, QUANTITATIVE, PREGNANCY: HCG, Total, QN: 7 m[IU]/mL

## 2021-05-11 NOTE — Telephone Encounter (Signed)
LMP 04/13/21.  Pt notified of STAT HCG results today of 7.  I told her this may not be a viable pregnancy but she need to return for the repeat HCG on Wed.  Dr Penne Lash aware of results.  A TVU cannot be done until HCG levels are @ least 1500.

## 2021-05-11 NOTE — Progress Notes (Cosign Needed)
Pt here requesting BHCG.  She had an ectopic pregnancy with surgery in May.  She is very anxious due to her history.  LMP was 04/13/21.  She will get STAT lab today and return in 48 hrs for repeat.  Will schedule an outpatient U/S when labs are available.  Dr Penne Lash is aware and agrees with plan of care.

## 2021-05-13 ENCOUNTER — Other Ambulatory Visit (INDEPENDENT_AMBULATORY_CARE_PROVIDER_SITE_OTHER): Payer: Medicaid Other

## 2021-05-13 DIAGNOSIS — O3680X0 Pregnancy with inconclusive fetal viability, not applicable or unspecified: Secondary | ICD-10-CM

## 2021-05-13 LAB — HCG, QUANTITATIVE, PREGNANCY: HCG, Total, QN: 37 m[IU]/mL

## 2021-05-13 NOTE — Progress Notes (Addendum)
Pt here for STAT BHCG per Dr.Leggett. Pt given lab orders and sent downstairs to lab. Pt has virtual appt with Dr.Duncan on 9/1 at 1:50pm to discuss HCG results and to have questions answered.

## 2021-05-14 ENCOUNTER — Encounter: Payer: Self-pay | Admitting: Obstetrics and Gynecology

## 2021-05-14 ENCOUNTER — Telehealth (INDEPENDENT_AMBULATORY_CARE_PROVIDER_SITE_OTHER): Payer: Medicaid Other | Admitting: Obstetrics and Gynecology

## 2021-05-14 DIAGNOSIS — Z712 Person consulting for explanation of examination or test findings: Secondary | ICD-10-CM | POA: Diagnosis not present

## 2021-05-14 DIAGNOSIS — Z8759 Personal history of other complications of pregnancy, childbirth and the puerperium: Secondary | ICD-10-CM

## 2021-05-14 NOTE — Progress Notes (Signed)
GYNECOLOGY VIRTUAL VISIT ENCOUNTER NOTE  Provider location: [facility] for [facility] [place]   Patient location: Home  I connected with Susan Krause on 05/14/21 at  1:50 PM EDT by MyChart Video Encounter and verified that I am speaking with the correct person using two identifiers.   I discussed the limitations, risks, security and privacy concerns of performing an evaluation and management service virtually and the availability of in person appointments. I also discussed with the patient that there may be a patient responsible charge related to this service. The patient expressed understanding and agreed to proceed.   History:  Susan Krause is a 30 y.o. G40P1001 female being evaluated today for reviewed of results from HCG levels.  She thinks she conceived on 04/30/21 based on OPKs. That would make her [redacted]w[redacted]d today with [facility] 01/21/22. She denies any pain. She does have a h/o ruptured ectopic pregnancy and had L/S salpingectomy for this on the left with Dr. Shawnie Pons.   She has had 2 betas 2 days apart - 7 to 37.   She denies any abnormal vaginal discharge, bleeding, pelvic pain or other concerns.       Past Medical History:  Diagnosis Date   ADHD (attention deficit hyperactivity disorder)    No meds   Cervical dysplasia    Chlamydia    Migraine    Propanolol 10 mg   PTSD (post-traumatic stress disorder)    no meds   PVC's (premature ventricular contractions) 2018   Past Surgical History:  Procedure Laterality Date   CESAREAN SECTION N/A 07/18/2018   Procedure: CESAREAN SECTION;  Surgeon: Essie Hart, MD;  Location: [facility] BIRTHING SUITES;  Service: Obstetrics;  Laterality: N/A;   DIAGNOSTIC LAPAROSCOPY WITH REMOVAL OF ECTOPIC PREGNANCY Left 12/20/2020   Procedure: LAPAROSCOPIC LEFT SALPINGECTOMY WITH REMOVAL OF ECTOPIC PREGNANCY;  Surgeon: [name], MD;  Location: [facility] OR;  Service: Gynecology;  Laterality: Left;   WISDOM TOOTH EXTRACTION     The following portions of the  patient's history were reviewed and updated as appropriate: allergies, current medications, past family history, past medical history, past social history, past surgical history and problem list.   Health Maintenance:  Normal pap on 01/22/21.    Review of Systems:  Pertinent items noted in HPI and remainder of comprehensive ROS otherwise negative.  Physical Exam:   General:  Alert, oriented and cooperative. Patient appears to be in no acute distress.  Mental Status: Normal mood and affect. Normal behavior. Normal judgment and thought content.   Respiratory: Normal respiratory effort, no problems with respiration noted  Rest of physical exam deferred due to type of encounter  Labs and Imaging Results for orders placed or performed in visit on 05/13/21 (from the past 336 hour(s))  B-HCG Quant   Collection Time: 05/13/21  9:45 AM  Result Value Ref Range   HCG, Total, QN 37 mIU/mL  Results for orders placed or performed in visit on 05/11/21 (from the past 336 hour(s))  hCG, quantitative, pregnancy   Collection Time: 05/11/21  9:27 AM  Result Value Ref Range   HCG, Total, QN 7 mIU/mL   No results found.     Assessment and Plan:  Diagnoses and all orders for this visit:  History of ectopic pregnancy -     Beta hCG quant (ref lab) next Tuesday c/w Dr. Bertram [place] plan - Recheck [place] likely end of next week pending beta results - Reviewed plan of care with pt - she agrees with plan  of care and feels comfortable with it.  - She understands [place] will not tell us viability but purpose is to confirm location given her hx of ectopic. Once confirmed IUP, we will proceed with routine prenatal care   I discussed the assessment and treatment plan with the patient. The patient was provided an opportunity to ask questions and all were answered. The patient agreed with the plan and demonstrated an understanding of the instructions.   The patient was advised to call back or seek an in-person evaluation/go  to the ED if the symptoms worsen or if the condition fails to improve as anticipated.  I provided 12 minutes of face-to-face time during this encounter.   Milas Hock, MD Center for [facility], [facility]

## 2021-05-19 ENCOUNTER — Inpatient Hospital Stay (HOSPITAL_COMMUNITY): Payer: Medicaid Other

## 2021-05-19 ENCOUNTER — Encounter (HOSPITAL_COMMUNITY): Payer: Self-pay | Admitting: Obstetrics and Gynecology

## 2021-05-19 ENCOUNTER — Telehealth: Payer: Self-pay | Admitting: *Deleted

## 2021-05-19 ENCOUNTER — Other Ambulatory Visit: Payer: Self-pay

## 2021-05-19 ENCOUNTER — Inpatient Hospital Stay (HOSPITAL_COMMUNITY)
Admission: AD | Admit: 2021-05-19 | Discharge: 2021-05-19 | Disposition: A | Discharge status: Home / Self Care | Payer: Medicaid Other | Attending: Obstetrics and Gynecology | Admitting: Obstetrics and Gynecology

## 2021-05-19 ENCOUNTER — Other Ambulatory Visit: Payer: Medicaid Other | Admitting: *Deleted

## 2021-05-19 DIAGNOSIS — R109 Unspecified abdominal pain: Secondary | ICD-10-CM

## 2021-05-19 DIAGNOSIS — Z8759 Personal history of other complications of pregnancy, childbirth and the puerperium: Secondary | ICD-10-CM | POA: Insufficient documentation

## 2021-05-19 DIAGNOSIS — O99891 Other specified diseases and conditions complicating pregnancy: Secondary | ICD-10-CM | POA: Insufficient documentation

## 2021-05-19 DIAGNOSIS — Z9079 Acquired absence of other genital organ(s): Secondary | ICD-10-CM | POA: Diagnosis not present

## 2021-05-19 DIAGNOSIS — F419 Anxiety disorder, unspecified: Secondary | ICD-10-CM | POA: Diagnosis not present

## 2021-05-19 DIAGNOSIS — Z91040 Latex allergy status: Secondary | ICD-10-CM | POA: Insufficient documentation

## 2021-05-19 DIAGNOSIS — O26899 Other specified pregnancy related conditions, unspecified trimester: Secondary | ICD-10-CM | POA: Insufficient documentation

## 2021-05-19 DIAGNOSIS — O99340 Other mental disorders complicating pregnancy, unspecified trimester: Secondary | ICD-10-CM

## 2021-05-19 DIAGNOSIS — O4690 Antepartum hemorrhage, unspecified, unspecified trimester: Secondary | ICD-10-CM

## 2021-05-19 DIAGNOSIS — Z79899 Other long term (current) drug therapy: Secondary | ICD-10-CM | POA: Diagnosis not present

## 2021-05-19 DIAGNOSIS — O26891 Other specified pregnancy related conditions, first trimester: Secondary | ICD-10-CM

## 2021-05-19 DIAGNOSIS — O3680X0 Pregnancy with inconclusive fetal viability, not applicable or unspecified: Secondary | ICD-10-CM | POA: Insufficient documentation

## 2021-05-19 DIAGNOSIS — Z3A00 Weeks of gestation of pregnancy not specified: Secondary | ICD-10-CM

## 2021-05-19 LAB — COMPREHENSIVE METABOLIC PANEL
ALT: 19 U/L (ref 0–44)
AST: 18 U/L (ref 15–41)
Albumin: 3.9 g/dL (ref 3.5–5.0)
Alkaline Phosphatase: 47 U/L (ref 38–126)
Anion gap: 6 (ref 5–15)
BUN: 10 mg/dL (ref 6–20)
CO2: 24 mmol/L (ref 22–32)
Calcium: 9.1 mg/dL (ref 8.9–10.3)
Chloride: 107 mmol/L (ref 98–111)
Creatinine, Ser: 0.58 mg/dL (ref 0.44–1.00)
GFR, Estimated: >60 mL/min (ref >60)
Glucose, Bld: 90 mg/dL (ref 70–99)
Potassium: 3.6 mmol/L (ref 3.5–5.1)
Sodium: 137 mmol/L (ref 135–145)
Total Bilirubin: 0.8 mg/dL (ref 0.3–1.2)
Total Protein: 6.6 g/dL (ref 6.5–8.1)

## 2021-05-19 LAB — CBC
HCT: 37 % (ref 36.0–46.0)
Hemoglobin: 12.7 g/dL (ref 12.0–15.0)
MCH: 28.9 pg (ref 26.0–34.0)
MCHC: 34.3 g/dL (ref 30.0–36.0)
MCV: 84.3 fL (ref 80.0–100.0)
Platelets: 277 10*3/uL (ref 150–400)
RBC: 4.39 MIL/uL (ref 3.87–5.11)
RDW: 12.2 % (ref 11.5–15.5)
WBC: 7.2 10*3/uL (ref 4.0–10.5)
nRBC: 0 % (ref 0.0–0.2)

## 2021-05-19 LAB — URINALYSIS, ROUTINE W REFLEX MICROSCOPIC
Bilirubin Urine: NEGATIVE
Glucose, UA: NEGATIVE mg/dL
Hgb urine dipstick: NEGATIVE
Ketones, ur: NEGATIVE mg/dL
Leukocytes,Ua: NEGATIVE
Nitrite: NEGATIVE
Protein, ur: NEGATIVE mg/dL
Specific Gravity, Urine: 1.02 (ref 1.005–1.030)
pH: 6 (ref 5.0–8.0)

## 2021-05-19 LAB — TYPE AND SCREEN
ABO/RH(D): O POS
Antibody Screen: NEGATIVE

## 2021-05-19 LAB — HCG, QUANTITATIVE, PREGNANCY: HCG, Total, QN: 31 m[IU]/mL

## 2021-05-19 MED ORDER — HYDROXYZINE PAMOATE 25 MG PO CAPS: 25.0000 mg | ORAL_CAPSULE | Freq: Three times a day (TID) | ORAL | 0 refills | Status: DC | PRN

## 2021-05-19 NOTE — MAU Note (Signed)
Pt c/o of light pink spotting this morning. After seeing the results of HCG levels, she developed sharp chest pain which comes and goes. As she was walking into triage she had sharp pain in mid-lower abdomen.

## 2021-05-19 NOTE — MAU Provider Note (Signed)
History     CSN: [id number]  Arrival date and time: 05/19/21 1501   Event Date/Time   First Provider Initiated Contact with Patient 05/19/21 1807      Chief Complaint  Patient presents with   Abdominal Pain   Vaginal Bleeding   Chest Pain   HPI Susan Krause is a 30 y.o. [id number] with pregnancy of unknown location who presents to MAU for evaluation of new onset bleeding and suprapubic cramping. These are new problems, onset this morning. Patient's abdominal pain is rated as 10/10. She declines offer of narcotic pain medicine.   Patient also c/o chest pain. She endorses history of chest pain related to anxiety. She states she planned to start Celexa then became pregnant. She is not currently in CBT and does not take medication for anxiety. Chest pain is rates 10/10. She denies activity intolerance, SOB, weakness, syncope.  Patient receives care with [facility].  OB History     Gravida  4   Para  1   Term  1   Preterm      AB  1   Living  1      SAB      IAB      Ectopic  1   Multiple  0   Live Births  1           Past Medical History:  Diagnosis Date   ADHD (attention deficit hyperactivity disorder)    No meds   Cervical dysplasia    Chlamydia    Migraine    Propanolol 10 mg   PTSD (post-traumatic stress disorder)    no meds   PVC's (premature ventricular contractions) 2018    Past Surgical History:  Procedure Laterality Date   CESAREAN SECTION N/A 07/18/2018   Procedure: CESAREAN SECTION;  Surgeon: Essie Hart, MD;  Location: [facility];  Service: Obstetrics;  Laterality: N/A;   DIAGNOSTIC LAPAROSCOPY WITH REMOVAL OF ECTOPIC PREGNANCY Left 12/20/2020   Procedure: LAPAROSCOPIC LEFT SALPINGECTOMY WITH REMOVAL OF ECTOPIC PREGNANCY;  Surgeon: [name], MD;  Location: [facility] OR;  Service: Gynecology;  Laterality: Left;   WISDOM TOOTH EXTRACTION      Family History  Problem Relation Age of Onset   ADD / ADHD Mother    Anxiety disorder Mother     Depression Mother    Hyperlipidemia Mother    Obesity Mother    ADD / ADHD Father    Anxiety disorder Sister    Asthma Sister    Depression Sister    Intellectual disability Brother    Learning disabilities Brother    ADD / ADHD Sister    Anxiety disorder Maternal Aunt    Diabetes Maternal Aunt    Alcohol abuse Maternal Uncle    Hyperlipidemia Maternal Uncle    Anxiety disorder Paternal Uncle    Diabetes Paternal Uncle    Anxiety disorder Maternal Grandmother    Diabetes Maternal Grandmother    Hyperlipidemia Maternal Grandmother    Varicose Veins Maternal Grandmother    Cancer Maternal Grandfather    Hyperlipidemia Maternal Grandfather    Obesity Maternal Grandfather    Vision loss Maternal Grandfather    Cancer Paternal Grandmother     Social History   Tobacco Use   Smoking status: Never   Smokeless tobacco: Never  Vaping Use   Vaping Use: Never used  Substance Use Topics   Alcohol use: No   Drug use: No    Allergies:  Allergies  Allergen Reactions  Banana Swelling   Latex Swelling   Contrast Media [Iodinated Diagnostic Agents] Nausea And Vomiting   Gardasil [Hpv 4-Valent Vaccine Recombinant Vaccine]    Hpv Bival (Type 16,18) Recomb Vaccine  [Human Papillomavirus (16,18) Recomb Vac] Hives   Peanut Oil Swelling    Tongue swells/itching   Pumpkin Flavor Swelling   Lactose Intolerance (Gi)     Medications Prior to Admission  Medication Sig Dispense Refill Last Dose   labetalol (NORMODYNE) 100 MG tablet Take 100 mg by mouth 2 (two) times daily.   05/18/2021   Prenatal Vit-Fe Fumarate-FA (MULTIVITAMIN-PRENATAL) 27-0.8 MG TABS tablet Take 1 tablet by mouth daily at 12 noon.   05/18/2021   cetirizine (ZYRTEC) 10 MG tablet Take 10 mg by mouth daily.   Unknown   fluticasone (FLONASE) 50 MCG/ACT nasal spray Place 1 spray into both nostrils daily. (Patient not taking: Reported on 05/14/2021)       Review of Systems  Cardiovascular:  Positive for chest pain.   Gastrointestinal:  Positive for abdominal pain.  Genitourinary:  Positive for vaginal bleeding.  All other systems reviewed and are negative. Physical Exam   Blood pressure 139/90, pulse 87, temperature 98.4 F (36.9 C), temperature source Oral, resp. rate 18, height 5\' 2"  (1.575 m), weight 80 kg, last menstrual period 04/13/2021, SpO2 100 %.  Physical Exam Vitals and nursing note reviewed. Exam conducted with a chaperone present.  Constitutional:      General: She is in acute distress.     Appearance: She is well-developed. She is obese. She is not ill-appearing.     Comments: Patient ambulating independently without difficulty, speaking in full sentences without evidence of air hunger  Cardiovascular:     Rate and Rhythm: Normal rate.     Heart sounds: Normal heart sounds.  Pulmonary:     Effort: Pulmonary effort is normal.     Breath sounds: Normal breath sounds.  Skin:    Capillary Refill: Capillary refill takes less than 2 seconds.  Neurological:     Mental Status: She is alert.    MAU Course  Procedures  --Decreasing quant hCG. Previously made plan for repeat quant hCG at [facility] in Thursday. Patient unaware of appointment. Verbalizes to Tuesday and RN "no one ever tells me anything". Strongly encouraged patient to keep Thursday's appointment.   Component     Latest Ref Rng & Units 05/11/2021 05/13/2021 05/19/2021  HCG, Total, QN     mIU/mL 7 37 31   --CNM planned to accelerate administration of ultrasound due to pain score and history of ruptured ectopic. Patient declined moving to ultrasound as she did not have a support person present.  --Patient's mother at bedside to accompany patient to ultrasound. Patient reassured in case of emergency, operative solution or non-reassuring news accommodations would be made to allow second support person. Overall reassuring assessment in MAU. CNM respectfully asking patient to adhere to guidelines for one support person.  Patient Vitals for  the past 24 hrs:  BP Temp Temp src Pulse Resp SpO2 Height Weight  05/19/21 1832 130/87 -- -- 91 -- -- -- --  05/19/21 1828 (!) 139/100 -- -- 85 -- -- -- --  05/19/21 1827 (!) 130/100 -- -- 90 18 100 % -- --  05/19/21 1516 139/90 98.4 F (36.9 C) Oral 87 18 100 % 5\' 2"  (1.575 m) 80 kg   Results for orders placed or performed during the hospital encounter of 05/19/21 (from the past 24 hour(s))  Urinalysis, Routine w  reflex microscopic Urine, Clean Catch     Status: None   Collection Time: 05/19/21  3:33 PM  Result Value Ref Range   Color, Urine YELLOW YELLOW   APPearance CLEAR CLEAR   Specific Gravity, Urine 1.020 1.005 - 1.030   pH 6.0 5.0 - 8.0   Glucose, UA NEGATIVE NEGATIVE mg/dL   Hgb urine dipstick NEGATIVE NEGATIVE   Bilirubin Urine NEGATIVE NEGATIVE   Ketones, ur NEGATIVE NEGATIVE mg/dL   Protein, ur NEGATIVE NEGATIVE mg/dL   Nitrite NEGATIVE NEGATIVE   Leukocytes,Ua NEGATIVE NEGATIVE  CBC     Status: None   Collection Time: 05/19/21  3:43 PM  Result Value Ref Range   WBC 7.2 4.0 - 10.5 K/uL   RBC 4.39 3.87 - 5.11 MIL/uL   Hemoglobin 12.7 12.0 - 15.0 g/dL   HCT 21.337.0 08.636.0 - 57.846.0 %   MCV 84.3 80.0 - 100.0 fL   MCH 28.9 26.0 - 34.0 pg   MCHC 34.3 30.0 - 36.0 g/dL   RDW 46.912.2 62.911.5 - 52.815.5 %   Platelets 277 150 - 400 K/uL   nRBC 0.0 0.0 - 0.2 %  Type and screen [facility]     Status: None   Collection Time: 05/19/21  3:43 PM  Result Value Ref Range   ABO/RH(D) O POS    Antibody Screen NEG    Sample Expiration      05/22/2021,2359 Performed at [facility], [address] [place], [place] [id number]   Comprehensive metabolic panel     Status: None   Collection Time: 05/19/21  3:43 PM  Result Value Ref Range   Sodium 137 135 - 145 mmol/L   Potassium 3.6 3.5 - 5.1 mmol/L   Chloride 107 98 - 111 mmol/L   CO2 24 22 - 32 mmol/L   Glucose, Bld 90 70 - 99 mg/dL   BUN 10 6 - 20 mg/dL   Creatinine, Ser 4.010.58 0.44 - 1.00 mg/dL   Calcium 9.1 8.9 - 02.710.3  mg/dL   Total Protein 6.6 6.5 - 8.1 g/dL   Albumin 3.9 3.5 - 5.0 g/dL   AST 18 15 - 41 U/L   ALT 19 0 - 44 U/L   Alkaline Phosphatase 47 38 - 126 U/L   Total Bilirubin 0.8 0.3 - 1.2 mg/dL   GFR, Estimated >25>60 >36>60 mL/min   Anion gap 6 5 - 15   US OB LESS THAN 14 WEEKS WITH OB TRANSVAGINAL  Result Date: 05/19/2021 CLINICAL DATA:  Spotting EXAM: OBSTETRIC <14 WK US AND TRANSVAGINAL OB US TECHNIQUE: Both transabdominal and transvaginal ultrasound examinations were performed for complete evaluation of the gestation as well as the maternal uterus, adnexal regions, and pelvic cul-de-sac. Transvaginal technique was performed to assess early pregnancy. COMPARISON:  None. FINDINGS: Intrauterine gestational sac: None Yolk sac:  Not Visualized. Embryo:  Not Visualized. Maternal uterus/adnexae: Ovaries are within normal limits. Left ovary measures 1.3 x 2 x 1.6 cm. Right ovary measures 2.4 x 3.2 x 2.4 cm. No significant free fluid IMPRESSION: No IUP identified. Findings consistent with pregnancy of unknown location, differential of which includes IUP too early to visualize, occult ectopic pregnancy, and recent failed pregnancy. Recommend trending of HCG with repeat ultrasound as indicated. Electronically Signed   By: Jasmine PangKim  Fujinaga M.D.   On: 05/19/2021 18:00    Meds ordered this encounter  Medications   hydrOXYzine (VISTARIL) 25 MG capsule    Sig: Take 1 capsule (25 mg total) by  mouth 3 (three) times daily as needed.    Dispense:  30 capsule    Refill:  0    Order Specific Question:   Supervising Provider    Answer:   Adam Phenix [[id number]]    Assessment and Plan  --30 y.o. [phone] with pregnancy of unknown location --Quant hCG 31 in office this morning --Normal EKG in MAU --Advised Vistaril, consult to [facility] for anxiety. New outpatient prescription --Discharge home in stable condition  F/U: --Repeat Stat Quant hCG 09/08 at [facility].  Calvert Cantor, CNM 05/19/2021, 8:29 PM

## 2021-05-19 NOTE — Telephone Encounter (Signed)
Pt notified of BHCG results today of 31.  Values were 37 in 05/14/21.  When she came into the office this AM she was c/oing of increase bleeding and cramping.  C Neill,CNM spoke with patient and encouraged her to go to the MAU for evaluation she was having the bleeding and pain.  Pt recently had an ectopic and had salpingectomy.  I called her to review her labs and she did not go to MAU as instructed and is now c/oing of chest pain.  She denies any SOB.  I again told her that I would insist that she go to MAU for evaluation to R/O another Ectopic.  Pt states that she will go now.

## 2021-05-20 ENCOUNTER — Telehealth: Payer: Self-pay | Admitting: *Deleted

## 2021-05-20 NOTE — Telephone Encounter (Signed)
Pt called stating that she went to the MAU and per pt,"they tried to sugar coat bull crap".  Pt states that she went to bathroom this AM with sharp abd cramping and the commode was full of blood and clots.  She does c/o of a HA today and ask if it was related to the pregnancy.  I suggested she take Tylenol and take one of the Vistaril that was prescribed to her last night in the MAU.  She is asking how long will she bleed and how much.  I told her it was unpredictable of how much bleeding and the length.  Every pregnancy is different.  She is encouraged to return for her repeat HCG on Thursday.  Pt is very adamant that she has now completely miscarried.  She is encouraged to call the office if any further questions.

## 2021-05-21 ENCOUNTER — Encounter: Payer: Self-pay | Admitting: Obstetrics and Gynecology

## 2021-05-21 ENCOUNTER — Telehealth: Payer: Self-pay | Admitting: *Deleted

## 2021-05-21 ENCOUNTER — Telehealth (INDEPENDENT_AMBULATORY_CARE_PROVIDER_SITE_OTHER): Payer: Medicaid Other | Admitting: Obstetrics and Gynecology

## 2021-05-21 ENCOUNTER — Other Ambulatory Visit: Payer: Self-pay | Admitting: Obstetrics and Gynecology

## 2021-05-21 ENCOUNTER — Other Ambulatory Visit (INDEPENDENT_AMBULATORY_CARE_PROVIDER_SITE_OTHER): Payer: Medicaid Other

## 2021-05-21 ENCOUNTER — Other Ambulatory Visit: Payer: Self-pay

## 2021-05-21 DIAGNOSIS — Z8759 Personal history of other complications of pregnancy, childbirth and the puerperium: Secondary | ICD-10-CM

## 2021-05-21 DIAGNOSIS — O209 Hemorrhage in early pregnancy, unspecified: Secondary | ICD-10-CM

## 2021-05-21 DIAGNOSIS — O3680X0 Pregnancy with inconclusive fetal viability, not applicable or unspecified: Secondary | ICD-10-CM

## 2021-05-21 DIAGNOSIS — O200 Threatened abortion: Secondary | ICD-10-CM

## 2021-05-21 LAB — HCG, QUANTITATIVE, PREGNANCY: HCG, Total, QN: 21 m[IU]/mL

## 2021-05-21 NOTE — Telephone Encounter (Signed)
Pt notified via phone of the dropping BHCG to 21.  Per Dr Jolayne Panther she should repeat in 1 week.  Pt voices understanding.

## 2021-05-21 NOTE — Progress Notes (Addendum)
Pt is here for STAT BHCG. Pt sent to lab for blood work. Pt requesting virtual appt with provider. Appt made for 10:10am today.

## 2021-05-21 NOTE — Progress Notes (Signed)
GYNECOLOGY VIRTUAL VISIT ENCOUNTER NOTE  Provider location: [facility] [place]   Patient location: Home  I connected with Susan Krause on 05/21/21 at 10:10 AM EDT by MyChart Video Encounter and verified that I am speaking with the correct person using two identifiers.   I discussed the limitations, risks, security and privacy concerns of performing an evaluation and management service virtually and the availability of in person appointments. I also discussed with the patient that there may be a patient responsible charge related to this service. The patient expressed understanding and agreed to proceed.   History:  Susan Krause is a 30 y.o. [phone] female being evaluated today for vaginal bleeding in pregnancy. Patient was seen in MAU a few days ago and diagnosed with a pregnancy of unknown location. Patient reports vaginal bleeding became heavier than a period yesterday with passage of palm size clots. She denies feeling dizzy or lightheaded. Patient had quant HCG drawn this morning. Patient does not understand why we are following her pregnancy hormone levels since it appears as though she is having a miscarriage.       Past Medical History:  Diagnosis Date   ADHD (attention deficit hyperactivity disorder)    No meds   Cervical dysplasia    Chlamydia    Migraine    Propanolol 10 mg   PTSD (post-traumatic stress disorder)    no meds   PVC's (premature ventricular contractions) 2018   Past Surgical History:  Procedure Laterality Date   CESAREAN SECTION N/A 07/18/2018   Procedure: CESAREAN SECTION;  Surgeon: Essie Hart, MD;  Location: [facility]IRTH[facility] SU[facility];  Service: Obstetrics;  Laterality: N/A;   DIAGNOSTIC LAPAROSCOPY WITH REMOVAL OF ECTOPIC PREGNANCY Left 12/20/2020   Procedure: LAPAROSCOPIC LEFT SALPINGECTOMY WITH REMOVAL OF ECTOPIC PREGNANCY;  Surgeon: [name], MD;  Location: [facility] OR;  Service: Gynecology;  Laterality: Left;   WISDOM TOOTH EXTRACTION      The following portions of the patient's history were reviewed and updated as appropriate: allergies, current medications, past family history, past medical history, past social history, past surgical history and problem list.   Health Maintenance:  Normal pap and negative HRHPV on 5/22.     Review of Systems:  Pertinent items noted in HPI and remainder of comprehensive ROS otherwise negative.  Physical Exam:   General:  Alert, oriented and cooperative. Patient appears to be in no acute distress.  Mental Status: Normal mood and affect. Normal behavior. Normal judgment and thought content.   Respiratory: Normal respiratory effort, no problems with respiration noted  Rest of physical exam deferred due to type of encounter  Labs and Imaging Results for orders placed or performed during the hospital encounter of 05/19/21 (from the past 336 hour(s))  Urinalysis, Routine w reflex microscopic Urine, Clean Catch   Collection Time: 05/19/21  3:33 PM  Result Value Ref Range   Color, Urine YELLOW YELLOW   APPearance CLEAR CLEAR   Specific Gravity, Urine 1.020 1.005 - 1.030   pH 6.0 5.0 - 8.0   Glucose, UA NEGATIVE NEGATIVE mg/dL   Hgb urine dipstick NEGATIVE NEGATIVE   Bilirubin Urine NEGATIVE NEGATIVE   Ketones, ur NEGATIVE NEGATIVE mg/dL   Protein, ur NEGATIVE NEGATIVE mg/dL   Nitrite NEGATIVE NEGATIVE   Leukocytes,Ua NEGATIVE NEGATIVE  CBC   Collection Time: 05/19/21  3:43 PM  Result Value Ref Range   WBC 7.2 4.0 - 10.5 K/uL   RBC 4.39 3.87 - 5.11 MIL/uL   Hemoglobin  12.7 12.0 - 15.0 g/dL   HCT 95.6 21.3 - 08.6 %   MCV 84.3 80.0 - 100.0 fL   MCH 28.9 26.0 - 34.0 pg   MCHC 34.3 30.0 - 36.0 g/dL   RDW 57.8 46.9 - 62.9 %   Platelets 277 150 - 400 K/uL   nRBC 0.0 0.0 - 0.2 %  Comprehensive metabolic panel   Collection Time: 05/19/21  3:43 PM  Result Value Ref Range   Sodium 137 135 - 145 mmol/L   Potassium 3.6 3.5 - 5.1 mmol/L   Chloride 107 98 - 111 mmol/L   CO2 24 22 - 32  mmol/L   Glucose, Bld 90 70 - 99 mg/dL   BUN 10 6 - 20 mg/dL   Creatinine, Ser 5.28 0.44 - 1.00 mg/dL   Calcium 9.1 8.9 - 41.3 mg/dL   Total Protein 6.6 6.5 - 8.1 g/dL   Albumin 3.9 3.5 - 5.0 g/dL   AST 18 15 - 41 U/L   ALT 19 0 - 44 U/L   Alkaline Phosphatase 47 38 - 126 U/L   Total Bilirubin 0.8 0.3 - 1.2 mg/dL   GFR, Estimated >24 >40 mL/min   Anion gap 6 5 - 15  Type and screen [facility]   Collection Time: 05/19/21  3:43 PM  Result Value Ref Range   ABO/RH(D) O POS    Antibody Screen NEG    Sample Expiration      05/22/2021,2359 Performed at [facility], [address], [place], [place] [address]   Results for orders placed or performed in visit on 05/13/21 (from the past 336 hour(s))  B-HCG Quant   Collection Time: 05/13/21  9:45 AM  Result Value Ref Range   HCG, Total, QN 37 mIU/mL  Results for orders placed or performed in visit on 05/11/21 (from the past 336 hour(s))  hCG, quantitative, pregnancy   Collection Time: 05/11/21  9:27 AM  Result Value Ref Range   HCG, Total, QN 7 mIU/mL  Results for orders placed or performed in visit on 05/11/21 (from the past 336 hour(s))  B-HCG Quant   Collection Time: 05/19/21  8:43 AM  Result Value Ref Range   HCG, Total, QN 31 mIU/mL   US OB LESS THAN 14 WEEKS WITH OB TRANSVAGINAL  Result Date: 05/19/2021 CLINICAL DATA:  Spotting EXAM: OBSTETRIC <14 WK [place] AND TRANSVAGINAL OB US TECHNIQUE: Both transabdominal and transvaginal ultrasound examinations were performed for complete evaluation of the gestation as well as the maternal uterus, adnexal regions, and pelvic cul-de-sac. Transvaginal technique was performed to assess early pregnancy. COMPARISON:  None. FINDINGS: Intrauterine gestational sac: None Yolk sac:  Not Visualized. Embryo:  Not Visualized. Maternal uterus/adnexae: Ovaries are within normal limits. Left ovary measures 1.3 x 2 x 1.6 cm. Right ovary measures 2.4 x 3.2 x 2.4 cm. No significant free  fluid IMPRESSION: No IUP identified. Findings consistent with pregnancy of unknown location, differential of which includes IUP too early to visualize, occult ectopic pregnancy, and recent failed pregnancy. Recommend trending of HCG with repeat ultrasound as indicated. Electronically Signed   By: Jasmine Pang M.D.   On: 05/19/2021 18:00       Assessment and Plan:     1. Pregnancy of unknown anatomic location Will continue to follow quant HCG. Patient will be contacted with results and informed of follow up plan  2. Vaginal bleeding in pregnancy, first trimester History consistent with spontaneous miscarriage.  Informed patient that we  still need to trend quant levels to negative  3. History of miscarriage Patient very upset regarding miscarriage and desires a referral Patient referred to genetic counseling Discussed with patient that first trimester miscarriages are common and often without clear etiology. This pregnancy was conceived with a different partner than her first child - [facility]B MFM GENETICS REFERRAL       I discussed the assessment and treatment plan with the patient. The patient was provided an opportunity to ask questions and all were answered. The patient agreed with the plan and demonstrated an understanding of the instructions.   The patient was advised to call back or seek an in-person evaluation/go to the ED if the symptoms worsen or if the condition fails to improve as anticipated.  I provided 15 minutes of face-to-face time during this encounter.   Catalina Antigua, MD [facility] [employer]

## 2021-05-21 NOTE — Progress Notes (Signed)
Just FYI - Beta has gone from 37>31>21 She can get a repeat in one week.  She had an [place] which wnl on 9/6.  Thanks, pad

## 2021-05-28 ENCOUNTER — Other Ambulatory Visit: Payer: Self-pay

## 2021-05-28 ENCOUNTER — Other Ambulatory Visit: Payer: Medicaid Other

## 2021-05-28 DIAGNOSIS — O039 Complete or unspecified spontaneous abortion without complication: Secondary | ICD-10-CM

## 2021-05-28 LAB — HCG, QUANTITATIVE, PREGNANCY: HCG, Total, QN: 11 m[IU]/mL

## 2021-05-28 NOTE — Progress Notes (Signed)
Repeat BHCG only today

## 2021-05-29 ENCOUNTER — Telehealth: Payer: Self-pay | Admitting: *Deleted

## 2021-05-29 NOTE — Telephone Encounter (Signed)
-----   Message from Milas Hock, MD sent at 05/29/2021  8:23 AM EDT ----- Please recheck one last beta in a week - I think I placed order correctly

## 2021-05-29 NOTE — Telephone Encounter (Signed)
Pt notified of latest BHcg dropping to 11.  She will return in 1 week for repet per Dr Para March

## 2021-05-29 NOTE — Addendum Note (Signed)
Addended by: Milas Hock A on: 05/29/2021 08:23 AM   Modules accepted: Orders

## 2021-06-04 ENCOUNTER — Other Ambulatory Visit: Payer: Self-pay

## 2021-06-04 ENCOUNTER — Telehealth: Payer: Self-pay

## 2021-06-04 ENCOUNTER — Other Ambulatory Visit: Payer: Medicaid Other

## 2021-06-04 DIAGNOSIS — O039 Complete or unspecified spontaneous abortion without complication: Secondary | ICD-10-CM

## 2021-06-04 LAB — HCG, QUANTITATIVE, PREGNANCY: HCG, Total, QN: 12 m[IU]/mL

## 2021-06-04 NOTE — Progress Notes (Signed)
Pt here for repeat BHCG only

## 2021-06-04 NOTE — Telephone Encounter (Signed)
Returned pt call. Spoke with pt and she is aware of BHCG results and to repeat BHCG in two weeks. Appt scheduled. Pt concerned that BHCG was 11 last time and 12 today. I told pt that per Dr.Duncan that small of a change can be based off the lab and also that this result is considered a plateau not a rise. Pt expressed understanding. Pt will return in 2 weeks for repeat BHCG.

## 2021-06-10 ENCOUNTER — Telehealth: Payer: Self-pay

## 2021-06-10 ENCOUNTER — Encounter: Payer: Self-pay | Admitting: *Deleted

## 2021-06-10 NOTE — Telephone Encounter (Signed)
Returned pt call. Pt states she had a positive UPT at home and that the positive line was much darker than normal and she is experiencing pregnancy symptoms. Pt is being followed with HCG's due to recent miscarriage. I explained to pt that the UPT can be positive due to recent miscarriage. I also explained to pt that the positive line can be darker due to her doing the test with her first urination of the morning. Pt is concerned that she is pregnant again. I offered for the pt to go to MAU if she is anxious and wants answers, ultrasound and labs today due to no provider being in the office. Pt declined going to MAU today. I offered appt with Dr.Duncan in office tomorrow and pt declined. Pt has nurse visit appt for 10/6 for BHCG. I offered for the pt to be on Dr.Duncan's schedule for that day to discuss concerns. Pt would like virtual appt only that day due to work schedule. Pt scheduled for virtual visit on 10/6. Pt was instructed to go to MAU if she begins to have any bleeding and/or cramping/pain. Pt expressed understanding.

## 2021-06-17 ENCOUNTER — Telehealth: Payer: Self-pay

## 2021-06-17 NOTE — Telephone Encounter (Signed)
Pt called stating that she had HCG drawn on 10/4 at 3:00pm with a different office. Results of that HCG were 25. Pt scheduled at 8:00 am on 10/6 for HCG blood draw and 10:50 for virtual appt with Dr.Duncan to discuss concerns. 8:00am HCG appt cancelled until Dr.Duncan lets her know when to do next HCG since previous HCG has been done in less than 48 hours. Pt expressed understanding.

## 2021-06-18 ENCOUNTER — Other Ambulatory Visit: Payer: Medicaid Other

## 2021-06-18 ENCOUNTER — Other Ambulatory Visit: Payer: Self-pay

## 2021-06-18 ENCOUNTER — Telehealth (INDEPENDENT_AMBULATORY_CARE_PROVIDER_SITE_OTHER): Payer: Medicaid Other | Admitting: Obstetrics and Gynecology

## 2021-06-18 ENCOUNTER — Encounter: Payer: Self-pay | Admitting: Obstetrics and Gynecology

## 2021-06-18 DIAGNOSIS — O039 Complete or unspecified spontaneous abortion without complication: Secondary | ICD-10-CM

## 2021-06-18 DIAGNOSIS — O00202 Left ovarian pregnancy without intrauterine pregnancy: Secondary | ICD-10-CM

## 2021-06-18 NOTE — Progress Notes (Signed)
GYNECOLOGY VIRTUAL VISIT ENCOUNTER NOTE  Provider location: [facility] for [facility]   Patient location: Home  I connected with [name] on 06/18/21 at 10:50 AM EDT by MyChart Video Encounter and verified that I am speaking with the correct person using two identifiers.   I discussed the limitations, risks, security and privacy concerns of performing an evaluation and management service virtually and the availability of in person appointments. I also discussed with the patient that there may be a patient responsible charge related to this service. The patient expressed understanding and agreed to proceed.   History:  Susan Krause is a 30 y.o. [phone] female being evaluated today for continued positive beta hcg. She denies any abnormal vaginal discharge, bleeding, pelvic pain or other concerns.    Beta last with [facility] was 12 about 2 weeks ago.  Beta through [facility] 2 days ago was 25.  She would like to do through [place] primarily because that is easier.   She had resolved pregnancy symptoms and then they returned - primarily that of breast tenderness.     Past Medical History:  Diagnosis Date   ADHD (attention deficit hyperactivity disorder)    No meds   Cervical dysplasia    Chlamydia    Migraine    Propanolol 10 mg   PTSD (post-traumatic stress disorder)    no meds   PVC's (premature ventricular contractions) 2018   Past Surgical History:  Procedure Laterality Date   CESAREAN SECTION N/A 07/18/2018   Procedure: CESAREAN SECTION;  Surgeon: Essie Hart, MD;  Location: [facility] BIRTHING SUITES;  Service: Obstetrics;  Laterality: N/A;   DIAGNOSTIC LAPAROSCOPY WITH REMOVAL OF ECTOPIC PREGNANCY Left 12/20/2020   Procedure: LAPAROSCOPIC LEFT SALPINGECTOMY WITH REMOVAL OF ECTOPIC PREGNANCY;  Surgeon: [name], MD;  Location: [facility] OR;  Service: Gynecology;  Laterality: Left;   WISDOM TOOTH EXTRACTION     The following portions of the patient's history were reviewed and  updated as appropriate: allergies, current medications, past family history, past medical history, past social history, past surgical history and problem list.   Health Maintenance:  Normal pap and negative HRHPV on 5/22.    Review of Systems:  Pertinent items noted in HPI and remainder of comprehensive ROS otherwise negative.  Physical Exam:   General:  Alert, oriented and cooperative. Patient appears to be in no acute distress.  Mental Status: Normal mood and affect. Normal behavior. Normal judgment and thought content.   Respiratory: Normal respiratory effort, no problems with respiration noted  Rest of physical exam deferred due to type of encounter  Labs and Imaging No results found for this or any previous visit (from the past 336 hour(s)). US OB LESS THAN 14 WEEKS WITH OB TRANSVAGINAL  Result Date: 05/19/2021 CLINICAL DATA:  Spotting EXAM: OBSTETRIC <14 WK [place] AND TRANSVAGINAL OB US TECHNIQUE: Both transabdominal and transvaginal ultrasound examinations were performed for complete evaluation of the gestation as well as the maternal uterus, adnexal regions, and pelvic cul-de-sac. Transvaginal technique was performed to assess early pregnancy. COMPARISON:  None. FINDINGS: Intrauterine gestational sac: None Yolk sac:  Not Visualized. Embryo:  Not Visualized. Maternal uterus/adnexae: Ovaries are within normal limits. Left ovary measures 1.3 x 2 x 1.6 cm. Right ovary measures 2.4 x 3.2 x 2.4 cm. No significant free fluid IMPRESSION: No IUP identified. Findings consistent with pregnancy of unknown location, differential of which includes IUP too early to visualize, occult ectopic pregnancy, and recent failed pregnancy. Recommend trending of HCG  with repeat ultrasound as indicated. Electronically Signed   By: Jasmine Pang M.D.   On: 05/19/2021 18:00       Assessment and Plan:     1. SAB (spontaneous abortion) - Most likely completed SAB but had not fully dropped to 0 - leveled out at 11-12 two  weeks ago and now at different lab, the beta is 25.  - Discussed unable to tell at this time, so we will recheck beta tomorrow at [facility] lab and continue to trend. While uncommon, this could represent a new pregnancy especially given symptoms resolved and now new symptoms of pregnancy - Discussed depending on level, may recheck as soon as Monday.   2. Ectopic pregnancy of left ovary - Given hx, we will follow betas closely - B-HCG Quant       I discussed the assessment and treatment plan with the patient. The patient was provided an opportunity to ask questions and all were answered. The patient agreed with the plan and demonstrated an understanding of the instructions.   The patient was advised to call back or seek an in-person evaluation/go to the ED if the symptoms worsen or if the condition fails to improve as anticipated.  I provided 7 minutes of face-to-face time during this encounter.   Milas Hock, MD Center for [facility], [facility]

## 2021-06-19 ENCOUNTER — Other Ambulatory Visit (INDEPENDENT_AMBULATORY_CARE_PROVIDER_SITE_OTHER): Payer: Medicaid Other | Admitting: *Deleted

## 2021-06-19 DIAGNOSIS — O021 Missed abortion: Secondary | ICD-10-CM

## 2021-06-19 LAB — HCG, QUANTITATIVE, PREGNANCY: HCG, Total, QN: 48 m[IU]/mL

## 2021-06-19 NOTE — Progress Notes (Signed)
Pt here for repeat BHCG only per Dr Para March.

## 2021-06-22 ENCOUNTER — Other Ambulatory Visit (INDEPENDENT_AMBULATORY_CARE_PROVIDER_SITE_OTHER): Payer: Medicaid Other

## 2021-06-22 ENCOUNTER — Other Ambulatory Visit: Payer: Self-pay

## 2021-06-22 DIAGNOSIS — O3680X0 Pregnancy with inconclusive fetal viability, not applicable or unspecified: Secondary | ICD-10-CM

## 2021-06-22 LAB — HCG, QUANTITATIVE, PREGNANCY: HCG, Total, QN: 60 m[IU]/mL

## 2021-06-22 NOTE — Progress Notes (Signed)
Repeat BHCG today.  Pt does state that she had a very small amount of pink discharge but denies any cramping.

## 2021-06-23 ENCOUNTER — Encounter (HOSPITAL_COMMUNITY): Payer: Self-pay | Admitting: Obstetrics and Gynecology

## 2021-06-23 ENCOUNTER — Telehealth: Payer: Self-pay | Admitting: *Deleted

## 2021-06-23 ENCOUNTER — Inpatient Hospital Stay (HOSPITAL_COMMUNITY): Payer: Medicaid Other

## 2021-06-23 ENCOUNTER — Inpatient Hospital Stay (HOSPITAL_COMMUNITY)
Admission: AD | Admit: 2021-06-23 | Discharge: 2021-06-23 | Disposition: A | Discharge status: Home / Self Care | Payer: Medicaid Other | Attending: Obstetrics and Gynecology | Admitting: Obstetrics and Gynecology

## 2021-06-23 DIAGNOSIS — R1032 Left lower quadrant pain: Secondary | ICD-10-CM | POA: Diagnosis present

## 2021-06-23 DIAGNOSIS — Z3A00 Weeks of gestation of pregnancy not specified: Secondary | ICD-10-CM

## 2021-06-23 DIAGNOSIS — O26891 Other specified pregnancy related conditions, first trimester: Secondary | ICD-10-CM | POA: Diagnosis not present

## 2021-06-23 DIAGNOSIS — O26899 Other specified pregnancy related conditions, unspecified trimester: Secondary | ICD-10-CM | POA: Diagnosis not present

## 2021-06-23 DIAGNOSIS — O3680X0 Pregnancy with inconclusive fetal viability, not applicable or unspecified: Secondary | ICD-10-CM | POA: Diagnosis not present

## 2021-06-23 DIAGNOSIS — Z6790 Unspecified blood type, Rh positive: Secondary | ICD-10-CM

## 2021-06-23 DIAGNOSIS — R109 Unspecified abdominal pain: Secondary | ICD-10-CM

## 2021-06-23 DIAGNOSIS — Z3A01 Less than 8 weeks gestation of pregnancy: Secondary | ICD-10-CM | POA: Diagnosis not present

## 2021-06-23 LAB — CBC
HCT: 38.2 % (ref 36.0–46.0)
Hemoglobin: 13 g/dL (ref 12.0–15.0)
MCH: 29 pg (ref 26.0–34.0)
MCHC: 34 g/dL (ref 30.0–36.0)
MCV: 85.1 fL (ref 80.0–100.0)
Platelets: 330 10*3/uL (ref 150–400)
RBC: 4.49 MIL/uL (ref 3.87–5.11)
RDW: 12.1 % (ref 11.5–15.5)
WBC: 9.7 10*3/uL (ref 4.0–10.5)
nRBC: 0 % (ref 0.0–0.2)

## 2021-06-23 LAB — URINALYSIS, ROUTINE W REFLEX MICROSCOPIC
Bacteria, UA: NONE SEEN
Bilirubin Urine: NEGATIVE
Glucose, UA: NEGATIVE mg/dL
Ketones, ur: NEGATIVE mg/dL
Leukocytes,Ua: NEGATIVE
Nitrite: NEGATIVE
Protein, ur: NEGATIVE mg/dL
Specific Gravity, Urine: 1.012 (ref 1.005–1.030)
pH: 7 (ref 5.0–8.0)

## 2021-06-23 LAB — WET PREP, GENITAL
Clue Cells Wet Prep HPF POC: NONE SEEN
Sperm: NONE SEEN
Trich, Wet Prep: NONE SEEN
Yeast Wet Prep HPF POC: NONE SEEN

## 2021-06-23 LAB — HCG, QUANTITATIVE, PREGNANCY: hCG, Beta Chain, Quant, S: 83 m[IU]/mL — ABNORMAL HIGH (ref <5)

## 2021-06-23 NOTE — MAU Note (Signed)
.  Susan Krause is a 30 y.o. at here in MAU reporting: patient states she spotting that started Saturday and it became heavier today and she is passing small clots. She had a miscarriage in early September. States her Sharene Butters was never followed back to 0 and the doctors told her this was a new pregnancy. She states she is having lower left sided pain, had left salpingectomy in April for ruptured ectopic.   Pain score: 4  Lab orders placed from triage:  UA

## 2021-06-23 NOTE — MAU Provider Note (Signed)
History     CSN: [id number]  Arrival date and time: 06/23/21 1623   Event Date/Time   First Provider Initiated Contact with Patient 06/23/21 1721     29 y.o. [id number] @unknown  gestation presenting with LAP and spotting. Reports pink spotting 2 days ago then today it became more red with small clots. She also started Terazol last night. Pain started today, located LLQ and intermittent. Recent hx of SAB in early September with falling qhcg then she noticed pregnancy sx and had a positive HPT. She saw her PCP on 10/4 for a yeast infection and qhcg was 25. Qhcg was 48 three days later and 60 two days after that which was yesterday. Hx of left ectopic requiring salpingectomy in April of this year.   OB History     Gravida  4   Para  1   Term  1   Preterm      AB  1   Living  1      SAB      IAB      Ectopic  1   Multiple  0   Live Births  1           Past Medical History:  Diagnosis Date   ADHD (attention deficit hyperactivity disorder)    No meds   Cervical dysplasia    Chlamydia    Migraine    Propanolol 10 mg   PTSD (post-traumatic stress disorder)    no meds   PVC's (premature ventricular contractions) 2018    Past Surgical History:  Procedure Laterality Date   CESAREAN SECTION N/A 07/18/2018   Procedure: CESAREAN SECTION;  Surgeon: 13/01/2018, MD;  Location: [facility] BIRTHING SUITES;  Service: Obstetrics;  Laterality: N/A;   DIAGNOSTIC LAPAROSCOPY WITH REMOVAL OF ECTOPIC PREGNANCY Left 12/20/2020   Procedure: LAPAROSCOPIC LEFT SALPINGECTOMY WITH REMOVAL OF ECTOPIC PREGNANCY;  Surgeon: 02/19/2021, MD;  Location: [facility] OR;  Service: Gynecology;  Laterality: Left;   SALPINGECTOMY Left 12/2020   WISDOM TOOTH EXTRACTION      Family History  Problem Relation Age of Onset   ADD / ADHD Mother    Anxiety disorder Mother    Depression Mother    Hyperlipidemia Mother    Obesity Mother    ADD / ADHD Father    Anxiety disorder Sister    Asthma Sister     Depression Sister    Intellectual disability Brother    Learning disabilities Brother    ADD / ADHD Sister    Anxiety disorder Maternal Aunt    Diabetes Maternal Aunt    Alcohol abuse Maternal Uncle    Hyperlipidemia Maternal Uncle    Anxiety disorder Paternal Uncle    Diabetes Paternal Uncle    Anxiety disorder Maternal Grandmother    Diabetes Maternal Grandmother    Hyperlipidemia Maternal Grandmother    Varicose Veins Maternal Grandmother    Cancer Maternal Grandfather    Hyperlipidemia Maternal Grandfather    Obesity Maternal Grandfather    Vision loss Maternal Grandfather    Cancer Paternal Grandmother     Social History   Tobacco Use   Smoking status: Never   Smokeless tobacco: Never  Vaping Use   Vaping Use: Never used  Substance Use Topics   Alcohol use: No   Drug use: No    Allergies:  Allergies  Allergen Reactions   Banana Swelling   Latex Swelling   Contrast Media [Iodinated Diagnostic Agents] Nausea And Vomiting   Gardasil 01/2021  4-Valent Vaccine Recombinant Vaccine]    Hpv Bival (Type 16,18) Recomb Vaccine  [Human Papillomavirus (16,18) Recomb Vac] Hives   Peanut Oil Swelling    Tongue swells/itching   Pumpkin Flavor Swelling   Lactose Intolerance (Gi)     No medications prior to admission.    Review of Systems  Gastrointestinal:  Positive for abdominal pain.  Genitourinary:  Positive for frequency and vaginal bleeding. Negative for dysuria and urgency.  Physical Exam   Blood pressure (!) 131/96, pulse 89, temperature 99.1 F (37.3 C), temperature source Oral, resp. rate 17, weight 79.3 kg, last menstrual period 04/13/2021, SpO2 100 %.  Physical Exam Vitals and nursing note reviewed.  Constitutional:      General: She is not in acute distress (appears comfortable).    Appearance: Normal appearance.  HENT:     Head: Normocephalic and atraumatic.  Cardiovascular:     Rate and Rhythm: Normal rate.  Pulmonary:     Effort: Pulmonary effort is  normal. No respiratory distress.  Abdominal:     General: There is no distension.     Palpations: Abdomen is soft. There is no mass.     Tenderness: There is no abdominal tenderness. There is no guarding or rebound.     Hernia: No hernia is present.  Musculoskeletal:        General: Normal range of motion.     Cervical back: Normal range of motion.  Skin:    General: Skin is warm and dry.  Neurological:     General: No focal deficit present.     Mental Status: She is alert and oriented to person, place, and time.  Psychiatric:        Mood and Affect: Mood is anxious.        Behavior: Behavior normal.   Results for orders placed or performed during the hospital encounter of 06/23/21 (from the past 24 hour(s))  Wet prep, genital     Status: Abnormal   Collection Time: 06/23/21  5:18 PM   Specimen: Vaginal  Result Value Ref Range   Yeast Wet Prep HPF POC NONE SEEN NONE SEEN   Trich, Wet Prep NONE SEEN NONE SEEN   Clue Cells Wet Prep HPF POC NONE SEEN NONE SEEN   WBC, Wet Prep HPF POC FEW (A) NONE SEEN   Sperm NONE SEEN   Urinalysis, Routine w reflex microscopic Urine, Clean Catch     Status: Abnormal   Collection Time: 06/23/21  5:19 PM  Result Value Ref Range   Color, Urine YELLOW YELLOW   APPearance CLEAR CLEAR   Specific Gravity, Urine 1.012 1.005 - 1.030   pH 7.0 5.0 - 8.0   Glucose, UA NEGATIVE NEGATIVE mg/dL   Hgb urine dipstick MODERATE (A) NEGATIVE   Bilirubin Urine NEGATIVE NEGATIVE   Ketones, ur NEGATIVE NEGATIVE mg/dL   Protein, ur NEGATIVE NEGATIVE mg/dL   Nitrite NEGATIVE NEGATIVE   Leukocytes,Ua NEGATIVE NEGATIVE   RBC / HPF 0-5 0 - 5 RBC/hpf   WBC, UA 0-5 0 - 5 WBC/hpf   Bacteria, UA NONE SEEN NONE SEEN   Squamous Epithelial / LPF 0-5 0 - 5  CBC     Status: None   Collection Time: 06/23/21  5:34 PM  Result Value Ref Range   WBC 9.7 4.0 - 10.5 K/uL   RBC 4.49 3.87 - 5.11 MIL/uL   Hemoglobin 13.0 12.0 - 15.0 g/dL   HCT 15.1 76.1 - 60.7 %   MCV 85.1 80.0  -  100.0 fL   MCH 29.0 26.0 - 34.0 pg   MCHC 34.0 30.0 - 36.0 g/dL   RDW 81.0 17.5 - 10.2 %   Platelets 330 150 - 400 K/uL   nRBC 0.0 0.0 - 0.2 %  hCG, quantitative, pregnancy     Status: Abnormal   Collection Time: 06/23/21  5:34 PM  Result Value Ref Range   hCG, Beta Chain, Quant, S 83 (H) <5 mIU/mL    US OB Transvaginal  Result Date: 06/23/2021 CLINICAL DATA:  Increased vaginal bleeding with left lower quadrant pain. EXAM: OBSTETRIC <14 WK [place] AND TRANSVAGINAL OB US TECHNIQUE: Both transabdominal and transvaginal ultrasound examinations were performed for complete evaluation of the gestation as well as the maternal uterus, adnexal regions, and pelvic cul-de-sac. Transvaginal technique was performed to assess early pregnancy. COMPARISON:  May 19, 2021 FINDINGS: Intrauterine gestational sac: None Yolk sac:  Not Visualized. Embryo:  Not Visualized. Cardiac Activity: Not Visualized. Heart Rate: N/A  bpm Maternal uterus/adnexae: The endometrium measures 13 mm in thickness. The right ovary measures 2.0 cm x 2.0 cm x 2.3 cm and is normal in appearance. The left ovary measures 2.0 cm x 1.7 cm x 2.3 cm and is normal in appearance. No pelvic free fluid is seen. IMPRESSION: No evidence of an intrauterine pregnancy. Correlation with follow-up pelvic ultrasound and serial beta HCG levels is recommended if an intrauterine or extra uterine pregnancy remains of clinical concern. Electronically Signed   By: Aram Candela M.D.   On: 06/23/2021 18:30    MAU Course  Procedures  MDM Labs and [place] ordered and reviewed. Rise in qhcg, but only 24 hrs since last checked. Consult with Dr. Para March. No IUGS, YS or FP seen on [place], findings could indicate early pregnancy, ectopic pregnancy, or failed pregnancy, discussed with pt. Will follow quant after 48 hrs. Stable for discharge home.   Assessment and Plan   1. Pregnancy, location unknown   2. Abdominal pain in pregnancy   3. Blood type, Rh positive     Discharge home Follow up at [facility] on 10/14 @0815  SAB/ectopic precautions  Allergies as of 06/23/2021       Reactions   Banana Swelling   Latex Swelling   Contrast Media [iodinated Diagnostic Agents] Nausea And Vomiting   Gardasil [hpv 4-valent Vaccine Recombinant Vaccine]    Hpv Bival (type 16,18) Recomb Vaccine  [human Papillomavirus (16,18) Recomb Vac] Hives   Peanut Oil Swelling   Tongue swells/itching   Pumpkin Flavor Swelling   Lactose Intolerance (gi)         Medication List     STOP taking these medications    labetalol 100 MG tablet Commonly known as: NORMODYNE       TAKE these medications    cetirizine 10 MG tablet Commonly known as: ZYRTEC Take 10 mg by mouth daily.   fluticasone 50 MCG/ACT nasal spray Commonly known as: FLONASE Place 1 spray into both nostrils daily.   hydrOXYzine 25 MG capsule Commonly known as: Vistaril Take 1 capsule (25 mg total) by mouth 3 (three) times daily as needed.   multivitamin-prenatal 27-0.8 MG Tabs tablet Take 1 tablet by mouth daily at 12 noon.       08/23/2021, CNM 06/23/2021, 8:23 PM

## 2021-06-23 NOTE — Telephone Encounter (Signed)
Pt called stating that after she uses the Terazol 7 cream she notices spotting the next day.  She is wanting to hold off on the last dose.  I encouraged her if not to use on inside to at least put it around the outside vaginal opening.  She is to return on Thursday for repeat BHCG.

## 2021-06-24 ENCOUNTER — Ambulatory Visit: Payer: Medicaid Other | Admitting: Obstetrics and Gynecology

## 2021-06-24 LAB — GC/CHLAMYDIA PROBE AMP (~~LOC~~) NOT AT ARMC
Chlamydia: NEGATIVE
Comment: NEGATIVE
Comment: NORMAL
Neisseria Gonorrhea: NEGATIVE

## 2021-06-25 ENCOUNTER — Telehealth: Payer: Self-pay | Admitting: *Deleted

## 2021-06-25 ENCOUNTER — Other Ambulatory Visit: Payer: Medicaid Other

## 2021-06-25 NOTE — Telephone Encounter (Signed)
Pt called with concerns that she is passing small clots vaginally when she has a bowel movement or picks up something heavy.  She does deny any pain or cramping.  I explained that a small amount of bleeding is pooling in the vaginal vault and that any type of straining will cause the clot to be expelled.  She is currently being followed with her quants.  I told her that if this ends up being a non viable pregnancy that she should consider using some form of BC to let her body have a rest.  She had an extopic earlier in summer then 2 back to back positive pregnancy test that we are following.

## 2021-06-26 ENCOUNTER — Other Ambulatory Visit: Payer: Self-pay

## 2021-06-26 ENCOUNTER — Inpatient Hospital Stay (HOSPITAL_COMMUNITY)
Admission: AD | Admit: 2021-06-26 | Discharge: 2021-06-26 | Disposition: A | Discharge status: Home / Self Care | Payer: Medicaid Other | Attending: Obstetrics & Gynecology | Admitting: Obstetrics & Gynecology

## 2021-06-26 ENCOUNTER — Ambulatory Visit (INDEPENDENT_AMBULATORY_CARE_PROVIDER_SITE_OTHER): Payer: Medicaid Other | Admitting: *Deleted

## 2021-06-26 ENCOUNTER — Encounter (HOSPITAL_COMMUNITY): Payer: Self-pay | Admitting: Obstetrics and Gynecology

## 2021-06-26 DIAGNOSIS — R1032 Left lower quadrant pain: Secondary | ICD-10-CM | POA: Insufficient documentation

## 2021-06-26 DIAGNOSIS — O0281 Inappropriate change in quantitative human chorionic gonadotropin (hCG) in early pregnancy: Secondary | ICD-10-CM | POA: Insufficient documentation

## 2021-06-26 DIAGNOSIS — O3680X0 Pregnancy with inconclusive fetal viability, not applicable or unspecified: Secondary | ICD-10-CM

## 2021-06-26 LAB — HCG, QUANTITATIVE, PREGNANCY: HCG, Total, QN: 93 m[IU]/mL

## 2021-06-26 NOTE — Progress Notes (Signed)
Pt here this AM for repeat BHCG.  She reports that she still only has light spotting when she wipes or has a BM.  She denies any cramping.  Dr Para March will call pt today with her results and what the next step will be in following this pregnancy.

## 2021-06-26 NOTE — MAU Provider Note (Signed)
History     CSN: [id number]  Arrival date and time: 06/26/21 1538   None     Chief Complaint  Patient presents with   Abdominal Pain   Vaginal Bleeding   HPI Susan Krause is a 30 y.o. [id number] at unknown gestational age who presents to discuss results.  Had an ectopic pregnancy in April & an early SAB last month. Has had HCGs checked in the office since 10/5. Last check was this morning but didn't receive a call about her results so presented here.  Reports some intermittent LLQ pain. Pain immediately precedes a BM. Will see some bright red blood in toilet after BM. Does not have pain otherwise. Pink spotting on toilet paper other than what she sees after BM.   OB History     Gravida  4   Para  1   Term  1   Preterm      AB  2   Living  1      SAB  1   IAB      Ectopic  1   Multiple  0   Live Births  1           Past Medical History:  Diagnosis Date   ADHD (attention deficit hyperactivity disorder)    No meds   Cervical dysplasia    Chlamydia    Migraine    Propanolol 10 mg   PTSD (post-traumatic stress disorder)    no meds   PVC's (premature ventricular contractions) 2018    Past Surgical History:  Procedure Laterality Date   CESAREAN SECTION N/A 07/18/2018   Procedure: CESAREAN SECTION;  Surgeon: Essie Hart, MD;  Location: [facility] BIRTHING SUITES;  Service: Obstetrics;  Laterality: N/A;   DIAGNOSTIC LAPAROSCOPY WITH REMOVAL OF ECTOPIC PREGNANCY Left 12/20/2020   Procedure: LAPAROSCOPIC LEFT SALPINGECTOMY WITH REMOVAL OF ECTOPIC PREGNANCY;  Surgeon: [name], MD;  Location: [facility] OR;  Service: Gynecology;  Laterality: Left;   SALPINGECTOMY Left 12/2020   WISDOM TOOTH EXTRACTION      Family History  Problem Relation Age of Onset   ADD / ADHD Mother    Anxiety disorder Mother    Depression Mother    Hyperlipidemia Mother    Obesity Mother    ADD / ADHD Father    Anxiety disorder Sister    Asthma Sister    Depression Sister     Intellectual disability Brother    Learning disabilities Brother    ADD / ADHD Sister    Anxiety disorder Maternal Aunt    Diabetes Maternal Aunt    Alcohol abuse Maternal Uncle    Hyperlipidemia Maternal Uncle    Anxiety disorder Paternal Uncle    Diabetes Paternal Uncle    Anxiety disorder Maternal Grandmother    Diabetes Maternal Grandmother    Hyperlipidemia Maternal Grandmother    Varicose Veins Maternal Grandmother    Cancer Maternal Grandfather    Hyperlipidemia Maternal Grandfather    Obesity Maternal Grandfather    Vision loss Maternal Grandfather    Cancer Paternal Grandmother     Social History   Tobacco Use   Smoking status: Never   Smokeless tobacco: Never  Vaping Use   Vaping Use: Never used  Substance Use Topics   Alcohol use: No   Drug use: No    Allergies:  Allergies  Allergen Reactions   Banana Swelling   Latex Swelling   Contrast Media [Iodinated Diagnostic Agents] Nausea And Vomiting   Gardasil [Hpv 4-Valent  Vaccine Recombinant Vaccine]    Hpv Bival (Type 16,18) Recomb Vaccine  [Human Papillomavirus (16,18) Recomb Vac] Hives   Peanut Oil Swelling    Tongue swells/itching   Pumpkin Flavor Swelling   Lactose Intolerance (Gi)     No medications prior to admission.    Review of Systems  Constitutional: Negative.   Gastrointestinal:  Positive for abdominal pain. Negative for constipation, diarrhea, nausea and vomiting.  Genitourinary:  Positive for vaginal bleeding.  Physical Exam   Blood pressure (!) 132/91, pulse 82, temperature 98.6 F (37 C), temperature source Oral, resp. rate 18, height 5\' 2"  (1.575 m), weight 79.3 kg, last menstrual period 04/13/2021, SpO2 100 %.  Physical Exam Vitals and nursing note reviewed.  Constitutional:      General: She is not in acute distress.    Appearance: She is well-developed. She is not ill-appearing.  HENT:     Head: Normocephalic and atraumatic.  Pulmonary:     Effort: Pulmonary effort is  normal. No respiratory distress.  Abdominal:     General: Abdomen is flat.     Palpations: Abdomen is soft.     Tenderness: There is no abdominal tenderness. There is no guarding or rebound.  Skin:    General: Skin is warm and dry.  Neurological:     General: No focal deficit present.     Mental Status: She is alert.  Psychiatric:        Mood and Affect: Mood normal.        Behavior: Behavior normal.    MAU Course  Procedures  MDM HCGs have been followed since 10/5. HCG this morning rose 55%.  Reviewed HCGs with Dr. 06/13/2021 who recommends repeat HCG on Monday.  She is currently asymptomatic. LLQ pain is likely GI related since it immediately precedes a bowel movement. Had left fallopian tube removed earlier this year. Abdomen is soft & non tender.   Assessment and Plan   1. Inappropriate change in quantitative hCG in early pregnancy    -scheduled for stat HCG at [facility] on Monday morning -reviewed reasons to return to MAU  Tuesday 06/26/2021, 7:57 PM

## 2021-06-26 NOTE — MAU Note (Signed)
Susan Krause is a 30 y.o. at [redacted]w[redacted]d here in MAU reporting: states since earlier this week she will get a sharp pain and then have a BM and then will see a blood clot. States have been getting larger. Is not having a lot a bleeding, states bleeding is pink when she wipes.   LMP: 04/13/2021  Onset of complaint: ongoing  Pain score: 8/10  Vitals:   06/26/21 1549  BP: 130/88  Pulse: 81  Resp: 18  Temp: 98.6 F (37 C)  SpO2: 100%     Lab orders placed from triage: UA

## 2021-06-26 NOTE — Discharge Instructions (Signed)
Return to care  If you have heavier bleeding that soaks through more than 2 pads per hour for an hour or more If you bleed so much that you feel like you might pass out or you do pass out If you have significant abdominal pain that is not improved with Tylenol   

## 2021-06-29 ENCOUNTER — Ambulatory Visit (INDEPENDENT_AMBULATORY_CARE_PROVIDER_SITE_OTHER): Payer: Medicaid Other

## 2021-06-29 ENCOUNTER — Other Ambulatory Visit (HOSPITAL_COMMUNITY)
Admission: RE | Admit: 2021-06-29 | Discharge: 2021-06-29 | Disposition: A | Discharge status: Home / Self Care | Payer: Medicaid Other | Source: Ambulatory Visit | Attending: Obstetrics & Gynecology | Admitting: Obstetrics & Gynecology

## 2021-06-29 ENCOUNTER — Other Ambulatory Visit: Payer: Self-pay

## 2021-06-29 ENCOUNTER — Other Ambulatory Visit (INDEPENDENT_AMBULATORY_CARE_PROVIDER_SITE_OTHER): Payer: Medicaid Other

## 2021-06-29 ENCOUNTER — Ambulatory Visit (INDEPENDENT_AMBULATORY_CARE_PROVIDER_SITE_OTHER): Payer: Medicaid Other | Admitting: Obstetrics & Gynecology

## 2021-06-29 ENCOUNTER — Encounter: Payer: Self-pay | Admitting: Obstetrics & Gynecology

## 2021-06-29 VITALS — BP 121/76 | HR 91 | Ht 62.0 in | Wt 176.0 lb

## 2021-06-29 DIAGNOSIS — O209 Hemorrhage in early pregnancy, unspecified: Secondary | ICD-10-CM | POA: Diagnosis not present

## 2021-06-29 DIAGNOSIS — N898 Other specified noninflammatory disorders of vagina: Secondary | ICD-10-CM | POA: Diagnosis not present

## 2021-06-29 DIAGNOSIS — O3680X0 Pregnancy with inconclusive fetal viability, not applicable or unspecified: Secondary | ICD-10-CM

## 2021-06-29 DIAGNOSIS — Z3687 Encounter for antenatal screening for uncertain dates: Secondary | ICD-10-CM

## 2021-06-29 LAB — COMPREHENSIVE METABOLIC PANEL
AG Ratio: 1.9 (calc) (ref 1.0–2.5)
ALT: 14 U/L (ref 6–29)
AST: 16 U/L (ref 10–30)
Albumin: 4.6 g/dL (ref 3.6–5.1)
Alkaline phosphatase (APISO): 54 U/L (ref 31–125)
BUN: 9 mg/dL (ref 7–25)
CO2: 28 mmol/L (ref 20–32)
Calcium: 9.7 mg/dL (ref 8.6–10.2)
Chloride: 104 mmol/L (ref 98–110)
Creat: 0.61 mg/dL (ref 0.50–0.96)
Globulin: 2.4 g/dL (calc) (ref 1.9–3.7)
Glucose, Bld: 85 mg/dL (ref 65–139)
Potassium: 4 mmol/L (ref 3.5–5.3)
Sodium: 137 mmol/L (ref 135–146)
Total Bilirubin: 0.4 mg/dL (ref 0.2–1.2)
Total Protein: 7 g/dL (ref 6.1–8.1)

## 2021-06-29 LAB — CBC
HCT: 37.8 % (ref 35.0–45.0)
Hemoglobin: 12.7 g/dL (ref 11.7–15.5)
MCH: 29.1 pg (ref 27.0–33.0)
MCHC: 33.6 g/dL (ref 32.0–36.0)
MCV: 86.5 fL (ref 80.0–100.0)
MPV: 10.1 fL (ref 7.5–12.5)
Platelets: 322 10*3/uL (ref 140–400)
RBC: 4.37 10*6/uL (ref 3.80–5.10)
RDW: 11.8 % (ref 11.0–15.0)
WBC: 7 10*3/uL (ref 3.8–10.8)

## 2021-06-29 LAB — HCG, QUANTITATIVE, PREGNANCY: HCG, Total, QN: 95 m[IU]/mL

## 2021-06-29 NOTE — Progress Notes (Signed)
Pt here for STAT HCG. Pt is returning for appt with MD today at 1:10

## 2021-06-29 NOTE — Progress Notes (Signed)
   Subjective:    Patient ID: Susan Krause, female    DOB: [birth year], 30 y.o.   MRN: [id number]  HPI  30 year old G4 para 1-0-2-1 with pregnancy of unknown location.  Patient had a left salpingectomy in April 2022.  Pathology does reveal products of conception.  Patient had normal menses once a month.  She is certain that that she has had at least 3 normal periods.  Patient became pregnant in August and miscarried.  Her beta went down to 7.  She then thinks she became pregnant again with the betas plateauing around 90 today.  Patient has right lower quadrant pain and also has feelings of nausea when she is examined.  She is no longer having frequent bowel movements that she had during her last MAU visit.  She has been bleeding for over a week.  She feels like there is odor to the blood.  She had cultures and wet prep done which were negative.  We will repeat the wet prep today.  Patient has a mild headache and wonders if she is anemic.  Review of Systems  Constitutional: Negative.   Respiratory: Negative.    Cardiovascular: Negative.   Genitourinary:  Positive for pelvic pain and vaginal bleeding.  Neurological:  Positive for headaches.      Objective:   Physical Exam Vitals reviewed.  Constitutional:      General: She is not in acute distress.    Appearance: She is well-developed.  HENT:     Head: Normocephalic and atraumatic.  Eyes:     Conjunctiva/sclera: Conjunctivae normal.  Cardiovascular:     Rate and Rhythm: Normal rate.  Pulmonary:     Effort: Pulmonary effort is normal.  Abdominal:     General: Abdomen is flat.     Palpations: Abdomen is soft. There is no mass.     Tenderness: There is abdominal tenderness. There is no guarding or rebound.  Genitourinary:    Comments: Tanner V Vulva:  No lesion Vagina:  Pink, no lesions, no discharge, + small amount of blood Cervix:  Mild CMT Uterus:  Non tender, mobile Right adnexa--mildly tender, no mass Left adnexa--non tender, no  mass  Skin:    General: Skin is warm and dry.  Neurological:     Mental Status: She is alert and oriented to person, place, and time.  Psychiatric:        Mood and Affect: Mood normal.     . FINAL MICROSCOPIC DIAGNOSIS:   A. FALLOPIAN TUBE, LEFT WITH ECTOPIC PREGNANCY:  -  Intraluminal chorionic villi and extravillous trophoblastic tissue  (consistent with ectopic pregnancy)     Assessment & Plan:  30 year old female with pregnancy of unknown location, headache, pelvic pain and bleeding, and vaginal odor.  Betas technically never went back to 0 after her August miscarriage.  It got down to 7.  She only remembers having intercourse once after last miscarriage before pregnant again.  Although would be rare, could be GTN and will keep this in the differential as we complete work up.   Betas have plateaued since her MAU visit.  Patient understands this is either a miscarriage or an ectopic pregnancy.  Given her pain, we will repeat the ultrasound stat today.  I will also get a stat CBC and CMP in preparation for methotrexate.  Vaginal odor could be from a mild endometritis and will keep this in the differential.

## 2021-06-30 ENCOUNTER — Other Ambulatory Visit: Payer: Self-pay

## 2021-06-30 ENCOUNTER — Encounter (HOSPITAL_COMMUNITY): Payer: Self-pay | Admitting: Obstetrics and Gynecology

## 2021-06-30 LAB — CERVICOVAGINAL ANCILLARY ONLY
Bacterial Vaginitis (gardnerella): POSITIVE — AB
Candida Glabrata: NEGATIVE
Candida Vaginitis: NEGATIVE
Comment: NEGATIVE
Comment: NEGATIVE
Comment: NEGATIVE

## 2021-06-30 NOTE — Progress Notes (Signed)
Ms Infantino denies chest pain or shortness of breath at this time.  Patient denies having any s/s of Covid in her household.  Patient denies any known exposure to Covid.   PCP is [employer], PA-C.  I instructed patient to shower with antibiotic soap, if it is available.  Dry off with a clean towel. Do not put lotion, powder, cologne or deodorant or makeup.No jewelry or piercings. Men may shave their face and neck. Woman should not shave. No nail polish, artificial or acrylic nails. Wear clean clothes, brush your teeth. Glasses, contact lens,dentures or partials may not be worn in the OR. If you need to wear them, please bring a case for glasses, do not wear contacts or bring a case, the hospital does not have contact cases, dentures or partials will have to be removed , make sure they are clean, we will provide a denture cup to put them in. You will need some one to drive you home and a responsible person over the age of 26 to stay with you for the first 24 hours after surgery.

## 2021-07-01 ENCOUNTER — Encounter (HOSPITAL_COMMUNITY): Payer: Self-pay | Admitting: Obstetrics and Gynecology

## 2021-07-01 ENCOUNTER — Ambulatory Visit (HOSPITAL_COMMUNITY): Payer: Medicaid Other | Admitting: Certified Registered Nurse Anesthetist

## 2021-07-01 ENCOUNTER — Ambulatory Visit (HOSPITAL_COMMUNITY)
Admission: RE | Admit: 2021-07-01 | Discharge: 2021-07-01 | Disposition: A | Discharge status: Home / Self Care | Payer: Medicaid Other | Source: Ambulatory Visit | Attending: Obstetrics and Gynecology | Admitting: Obstetrics and Gynecology

## 2021-07-01 ENCOUNTER — Other Ambulatory Visit: Payer: Self-pay

## 2021-07-01 ENCOUNTER — Encounter (HOSPITAL_COMMUNITY)
Admission: RE | Disposition: A | Discharge status: Home / Self Care | Payer: Self-pay | Source: Ambulatory Visit | Attending: Obstetrics and Gynecology

## 2021-07-01 DIAGNOSIS — Z91018 Allergy to other foods: Secondary | ICD-10-CM | POA: Diagnosis not present

## 2021-07-01 DIAGNOSIS — Z91010 Allergy to peanuts: Secondary | ICD-10-CM | POA: Diagnosis not present

## 2021-07-01 DIAGNOSIS — O3680X0 Pregnancy with inconclusive fetal viability, not applicable or unspecified: Secondary | ICD-10-CM

## 2021-07-01 DIAGNOSIS — Z91011 Allergy to milk products: Secondary | ICD-10-CM | POA: Diagnosis not present

## 2021-07-01 DIAGNOSIS — O0281 Inappropriate change in quantitative human chorionic gonadotropin (hCG) in early pregnancy: Secondary | ICD-10-CM

## 2021-07-01 DIAGNOSIS — Z91040 Latex allergy status: Secondary | ICD-10-CM | POA: Insufficient documentation

## 2021-07-01 DIAGNOSIS — Z79899 Other long term (current) drug therapy: Secondary | ICD-10-CM | POA: Insufficient documentation

## 2021-07-01 DIAGNOSIS — Z887 Allergy status to serum and vaccine status: Secondary | ICD-10-CM | POA: Insufficient documentation

## 2021-07-01 DIAGNOSIS — E669 Obesity, unspecified: Secondary | ICD-10-CM | POA: Diagnosis not present

## 2021-07-01 HISTORY — PX: DILATION AND CURETTAGE OF UTERUS: SHX78

## 2021-07-01 HISTORY — DX: Obsessive-compulsive disorder, unspecified: F42.9

## 2021-07-01 HISTORY — DX: Dyspnea, unspecified: R06.00

## 2021-07-01 HISTORY — DX: Ventricular premature depolarization: I49.3

## 2021-07-01 LAB — TYPE AND SCREEN
ABO/RH(D): O POS
Antibody Screen: NEGATIVE

## 2021-07-01 LAB — HCG, QUANTITATIVE, PREGNANCY: hCG, Beta Chain, Quant, S: 104 m[IU]/mL — ABNORMAL HIGH (ref <5)

## 2021-07-01 SURGERY — DILATION AND CURETTAGE
Anesthesia: General | Site: Vagina

## 2021-07-01 MED ORDER — 0.9 % SODIUM CHLORIDE (POUR BTL) OPTIME
TOPICAL | Status: DC | PRN
  Administered 2021-07-01: 1000 mL

## 2021-07-01 MED ORDER — ACETAMINOPHEN 500 MG PO TABS
1000.0000 mg | ORAL_TABLET | ORAL | Status: AC
  Administered 2021-07-01: 1000 mg via ORAL
  Filled 2021-07-01: qty 2

## 2021-07-01 MED ORDER — FENTANYL CITRATE (PF) 100 MCG/2ML IJ SOLN: 25.0000 ug | INTRAMUSCULAR | Status: DC | PRN

## 2021-07-01 MED ORDER — PHENYLEPHRINE 40 MCG/ML (10ML) SYRINGE FOR IV PUSH (FOR BLOOD PRESSURE SUPPORT)
PREFILLED_SYRINGE | INTRAVENOUS | Status: DC | PRN
  Administered 2021-07-01: 80 ug via INTRAVENOUS

## 2021-07-01 MED ORDER — KETOROLAC TROMETHAMINE 30 MG/ML IJ SOLN
INTRAMUSCULAR | Status: DC | PRN
  Administered 2021-07-01: 30 mg via INTRAVENOUS

## 2021-07-01 MED ORDER — DEXAMETHASONE SODIUM PHOSPHATE 10 MG/ML IJ SOLN
INTRAMUSCULAR | Status: DC | PRN
  Administered 2021-07-01: 8 mg via INTRAVENOUS

## 2021-07-01 MED ORDER — PHENYLEPHRINE 40 MCG/ML (10ML) SYRINGE FOR IV PUSH (FOR BLOOD PRESSURE SUPPORT)
PREFILLED_SYRINGE | INTRAVENOUS | Status: AC
  Filled 2021-07-01: qty 10

## 2021-07-01 MED ORDER — FENTANYL CITRATE (PF) 250 MCG/5ML IJ SOLN
INTRAMUSCULAR | Status: AC
  Filled 2021-07-01: qty 5

## 2021-07-01 MED ORDER — METHOTREXATE FOR ECTOPIC PREGNANCY
50.0000 mg/m2 | Freq: Once | INTRAMUSCULAR | Status: AC
  Administered 2021-07-01: 93.5 mg via INTRAMUSCULAR
  Filled 2021-07-01: qty 3.74

## 2021-07-01 MED ORDER — ACETAMINOPHEN 10 MG/ML IV SOLN
INTRAVENOUS | Status: AC
  Filled 2021-07-01: qty 100

## 2021-07-01 MED ORDER — FENTANYL CITRATE (PF) 100 MCG/2ML IJ SOLN
INTRAMUSCULAR | Status: DC | PRN
  Administered 2021-07-01: 100 ug via INTRAVENOUS

## 2021-07-01 MED ORDER — CHLORHEXIDINE GLUCONATE 0.12 % MT SOLN
15.0000 mL | Freq: Once | OROMUCOSAL | Status: AC
  Administered 2021-07-01: 15 mL via OROMUCOSAL
  Filled 2021-07-01: qty 15

## 2021-07-01 MED ORDER — OXYCODONE HCL 5 MG/5ML PO SOLN: 5.0000 mg | Freq: Once | ORAL | Status: DC | PRN

## 2021-07-01 MED ORDER — PROMETHAZINE HCL 25 MG/ML IJ SOLN
6.2500 mg | INTRAMUSCULAR | Status: DC | PRN
  Administered 2021-07-01: 6.25 mg via INTRAVENOUS

## 2021-07-01 MED ORDER — ONDANSETRON HCL 4 MG/2ML IJ SOLN
INTRAMUSCULAR | Status: DC | PRN
Start: 2021-07-01 — End: 2021-07-01
  Administered 2021-07-01: 4 mg via INTRAVENOUS

## 2021-07-01 MED ORDER — MIDAZOLAM HCL 2 MG/2ML IJ SOLN
INTRAMUSCULAR | Status: AC
  Filled 2021-07-01: qty 2

## 2021-07-01 MED ORDER — KETOROLAC TROMETHAMINE 15 MG/ML IJ SOLN
15.0000 mg | INTRAMUSCULAR | Status: DC
  Filled 2021-07-01: qty 1

## 2021-07-01 MED ORDER — NORETHINDRONE 0.35 MG PO TABS: 1.0000 | ORAL_TABLET | Freq: Every day | ORAL | 3 refills | Status: DC

## 2021-07-01 MED ORDER — DEXMEDETOMIDINE (PRECEDEX) IN NS 20 MCG/5ML (4 MCG/ML) IV SYRINGE
PREFILLED_SYRINGE | INTRAVENOUS | Status: DC | PRN
  Administered 2021-07-01: 8 ug via INTRAVENOUS

## 2021-07-01 MED ORDER — POVIDONE-IODINE 10 % EX SWAB
2.0000 "application " | Freq: Once | CUTANEOUS | Status: AC
  Administered 2021-07-01: 2 via TOPICAL

## 2021-07-01 MED ORDER — DOXYCYCLINE HYCLATE 100 MG IV SOLR
200.0000 mg | INTRAVENOUS | Status: AC
  Administered 2021-07-01: 200 mg via INTRAVENOUS
  Filled 2021-07-01: qty 200

## 2021-07-01 MED ORDER — ORAL CARE MOUTH RINSE: 15.0000 mL | Freq: Once | OROMUCOSAL | Status: AC

## 2021-07-01 MED ORDER — ACETAMINOPHEN 10 MG/ML IV SOLN: 1000.0000 mg | Freq: Once | INTRAVENOUS | Status: DC | PRN

## 2021-07-01 MED ORDER — PROPOFOL 10 MG/ML IV BOLUS
INTRAVENOUS | Status: DC | PRN
  Administered 2021-07-01: 200 mg via INTRAVENOUS

## 2021-07-01 MED ORDER — LACTATED RINGERS IV SOLN: INTRAVENOUS | Status: DC

## 2021-07-01 MED ORDER — MIDAZOLAM HCL 2 MG/2ML IJ SOLN
INTRAMUSCULAR | Status: DC | PRN
  Administered 2021-07-01: 2 mg via INTRAVENOUS

## 2021-07-01 MED ORDER — OXYCODONE HCL 5 MG PO TABS: 5.0000 mg | ORAL_TABLET | Freq: Once | ORAL | Status: DC | PRN

## 2021-07-01 MED ORDER — PROMETHAZINE HCL 25 MG/ML IJ SOLN
INTRAMUSCULAR | Status: AC
  Filled 2021-07-01: qty 1

## 2021-07-01 MED ORDER — LIDOCAINE 2% (20 MG/ML) 5 ML SYRINGE
INTRAMUSCULAR | Status: DC | PRN
  Administered 2021-07-01: 60 mg via INTRAVENOUS

## 2021-07-01 MED ORDER — IBUPROFEN 600 MG PO TABS: 600.0000 mg | ORAL_TABLET | Freq: Four times a day (QID) | ORAL | 3 refills | Status: DC | PRN

## 2021-07-01 MED ORDER — AMISULPRIDE (ANTIEMETIC) 5 MG/2ML IV SOLN: 10.0000 mg | Freq: Once | INTRAVENOUS | Status: DC | PRN

## 2021-07-01 SURGICAL SUPPLY — 15 items
FILTER UTR ASPR ASSEMBLY (MISCELLANEOUS) ×2 IMPLANT
GLOVE SURG ENC MOIS LTX SZ6 (GLOVE) ×2 IMPLANT
GLOVE SURG UNDER LTX SZ6.5 (GLOVE) ×4 IMPLANT
GOWN STRL REUS W/ TWL LRG LVL3 (GOWN DISPOSABLE) ×2 IMPLANT
GOWN STRL REUS W/TWL LRG LVL3 (GOWN DISPOSABLE) ×4
HOSE CONNECTING 18IN BERKELEY (TUBING) ×2 IMPLANT
KIT BERKELEY 1ST TRI 3/8 NO TR (MISCELLANEOUS) ×2 IMPLANT
KIT BERKELEY 1ST TRIMESTER 3/8 (MISCELLANEOUS) ×2 IMPLANT
NS IRRIG 1000ML POUR BTL (IV SOLUTION) ×2 IMPLANT
PACK VAGINAL MINOR WOMEN LF (CUSTOM PROCEDURE TRAY) ×2 IMPLANT
PAD OB MATERNITY 4.3X12.25 (PERSONAL CARE ITEMS) ×2 IMPLANT
SET BERKELEY SUCTION TUBING (SUCTIONS) ×2 IMPLANT
TOWEL GREEN STERILE FF (TOWEL DISPOSABLE) ×4 IMPLANT
UNDERPAD 30X36 HEAVY ABSORB (UNDERPADS AND DIAPERS) ×2 IMPLANT
VACURETTE 6 ASPIR F TIP BERK (CANNULA) ×2 IMPLANT

## 2021-07-01 NOTE — Discharge Instructions (Addendum)
Call Center for [facility] tomorrow 07/02/21 for a follow up check for hCG levels.  They need to be checked four days from now and 7 days from now.

## 2021-07-01 NOTE — Progress Notes (Signed)
Pt reports that she would like to talk to surgeon this morning about medical necessity of the procedure she is having today. Pt is concerned "about my baby, and if I really need this done. I think I might want my numbers checked again." Will hold off on medication administration until pt speaks w/surgeon.

## 2021-07-01 NOTE — Anesthesia Postprocedure Evaluation (Signed)
Anesthesia Post Note  Patient: [name]  Procedure(s) Performed: DILATATION AND CURETTAGE WITH PATHOLOGY (Vagina )     Patient location during evaluation: PACU Anesthesia Type: General Level of consciousness: awake Pain management: pain level controlled Vital Signs Assessment: post-procedure vital signs reviewed and stable Respiratory status: spontaneous breathing, nonlabored ventilation, respiratory function stable and patient connected to nasal cannula oxygen Cardiovascular status: blood pressure returned to baseline and stable Postop Assessment: no apparent nausea or vomiting Anesthetic complications: no   No notable events documented.  Last Vitals:  Vitals:   07/01/21 1615 07/01/21 1630  BP: 126/82 117/69  Pulse: 94 86  Resp: 16 15  Temp:  36.7 C  SpO2: 98% 97%    Last Pain:  Vitals:   07/01/21 1630  TempSrc:   PainSc: 3                  Roxene Alviar P Meleah Demeyer

## 2021-07-01 NOTE — Anesthesia Preprocedure Evaluation (Signed)
Anesthesia Evaluation  Patient identified by MRN, date of birth, ID band Patient awake    Reviewed: Allergy & Precautions, NPO status , Patient's Chart, lab work & pertinent test results  Airway Mallampati: I  TM Distance: >3 FB Neck ROM: Full    Dental no notable dental hx.    Pulmonary neg pulmonary ROS,    Pulmonary exam normal breath sounds clear to auscultation       Cardiovascular negative cardio ROS Normal cardiovascular exam Rhythm:Regular Rate:Normal  ECG: NSR, rate 86   Neuro/Psych  Headaches, PSYCHIATRIC DISORDERS Anxiety    GI/Hepatic negative GI ROS, Neg liver ROS,   Endo/Other  negative endocrine ROS  Renal/GU negative Renal ROS     Musculoskeletal negative musculoskeletal ROS (+)   Abdominal (+) + obese,   Peds  (+) ATTENTION DEFICIT DISORDER WITHOUT HYPERACTIVITY Hematology negative hematology ROS (+)   Anesthesia Other Findings PELVIC PAIN AND BLEEDING  Reproductive/Obstetrics                             Anesthesia Physical Anesthesia Plan  ASA: 2  Anesthesia Plan: General   Post-op Pain Management:    Induction: Intravenous  PONV Risk Score and Plan: 3 and Ondansetron, Dexamethasone, Midazolam and Treatment may vary due to age or medical condition  Airway Management Planned: LMA  Additional Equipment:   Intra-op Plan:   Post-operative Plan: Extubation in OR  Informed Consent: I have reviewed the patients History and Physical, chart, labs and discussed the procedure including the risks, benefits and alternatives for the proposed anesthesia with the patient or authorized representative who has indicated his/her understanding and acceptance.     Dental advisory given  Plan Discussed with: CRNA  Anesthesia Plan Comments:         Anesthesia Quick Evaluation

## 2021-07-01 NOTE — H&P (Signed)
Susan Krause is an 30 y.o. female. Here for D&E in the s/o abnormal beta (plateau) in a pregnancy of unknown location with a history of ectopic pregnancy. She didn't have complete resolution of beta from her last pregnancy - it dropped to seven when it started rising again and plateau'd around 90. She notes intermittent bleeding over the last week. She reported foul odor.   Pertinent Gynecological History: Bleeding: see HPI Contraception: none DES exposure: denies Blood transfusions: none Sexually transmitted diseases: no past history Previous GYN Procedures:  L/S salpingectomy    Last pap: normal Date: 01/2021 OB History: G4, P1   Menstrual History: Patient's last menstrual period was 04/13/2021.    Past Medical History:  Diagnosis Date   ADHD (attention deficit hyperactivity disorder)    No meds   Cervical dysplasia    Chlamydia    Dyspnea    06/30/2020- climbing stairs- while I'm pregnant   Head injury with loss of consciousness (HCC) 03/2020   Migraine    Propanolol 10 mg   OCD (obsessive compulsive disorder)    PTSD (post-traumatic stress disorder)    no meds   PVC (premature ventricular contraction)    PVC's (premature ventricular contractions) 2018    Past Surgical History:  Procedure Laterality Date   CESAREAN SECTION N/A 07/18/2018   Procedure: CESAREAN SECTION;  Surgeon: Essie Hart, MD;  Location: [facility];  Service: Obstetrics;  Laterality: N/A;   DIAGNOSTIC LAPAROSCOPY WITH REMOVAL OF ECTOPIC PREGNANCY Left 12/20/2020   Procedure: LAPAROSCOPIC LEFT SALPINGECTOMY WITH REMOVAL OF ECTOPIC PREGNANCY;  Surgeon: [name], MD;  Location: [facility] OR;  Service: Gynecology;  Laterality: Left;   SALPINGECTOMY Left 12/2020   WISDOM TOOTH EXTRACTION      Family History  Problem Relation Age of Onset   ADD / ADHD Mother    Anxiety disorder Mother    Depression Mother    Hyperlipidemia Mother    Obesity Mother    ADD / ADHD Father    Anxiety disorder Sister     Asthma Sister    Depression Sister    Intellectual disability Brother    Learning disabilities Brother    ADD / ADHD Sister    Anxiety disorder Maternal Aunt    Diabetes Maternal Aunt    Alcohol abuse Maternal Uncle    Hyperlipidemia Maternal Uncle    Anxiety disorder Paternal Uncle    Diabetes Paternal Uncle    Anxiety disorder Maternal Grandmother    Diabetes Maternal Grandmother    Hyperlipidemia Maternal Grandmother    Varicose Veins Maternal Grandmother    Cancer Maternal Grandfather    Hyperlipidemia Maternal Grandfather    Obesity Maternal Grandfather    Vision loss Maternal Grandfather    Cancer Paternal Grandmother     Social History:  reports that she has never smoked. She has never used smokeless tobacco. She reports that she does not drink alcohol and does not use drugs.  Allergies:  Allergies  Allergen Reactions   Banana Swelling   Latex Swelling   Contrast Media [Iodinated Diagnostic Agents] Nausea And Vomiting   Gardasil [Hpv 4-Valent Vaccine Recombinant Vaccine]    Hpv Bival (Type 16,18) Recomb Vaccine  [Human Papillomavirus (16,18) Recomb Vac] Hives   Peanut Oil Swelling    Tongue swells/itching   Pumpkin Flavor Swelling   Lactose Intolerance (Gi)     Medications Prior to Admission  Medication Sig Dispense Refill Last Dose   acetaminophen (TYLENOL) 500 MG tablet Take 1,000 mg by mouth  every 6 (six) hours as needed for mild pain.   06/28/2021   albuterol (VENTOLIN HFA) 108 (90 Base) MCG/ACT inhaler Inhale 1-2 puffs into the lungs every 6 (six) hours as needed for wheezing or shortness of breath.      fluticasone (FLONASE) 50 MCG/ACT nasal spray Place 1 spray into both nostrils daily as needed for allergies.   unk   labetalol (NORMODYNE) 100 MG tablet Take 100 mg by mouth 2 (two) times daily.   06/30/2021   Prenatal Vit-Fe Fumarate-FA (MULTIVITAMIN-PRENATAL) 27-0.8 MG TABS tablet Take 1 tablet by mouth daily at 12 noon.   06/29/2021   hydrOXYzine  (VISTARIL) 25 MG capsule Take 1 capsule (25 mg total) by mouth 3 (three) times daily as needed. (Patient not taking: No sig reported) 30 capsule 0 Not Taking    Review of Systems  Constitutional:  Negative for fatigue and fever.  Respiratory:  Negative for apnea and chest tightness.   Cardiovascular:  Negative for chest pain.  Gastrointestinal:  Positive for abdominal pain.  Endocrine: Negative for cold intolerance and heat intolerance.  Genitourinary:  Positive for pelvic pain and vaginal bleeding.  Skin:  Negative for color change and pallor.  Neurological:  Negative for dizziness.  Psychiatric/Behavioral:  Negative for agitation and behavioral problems.   All other systems reviewed and are negative.  Blood pressure (!) 144/81, pulse 86, temperature 98.4 F (36.9 C), temperature source Oral, resp. rate 18, height 5\' 2"  (1.575 m), weight 80 kg, last menstrual period 04/13/2021, SpO2 100 %. Physical Exam Constitutional:      Appearance: Normal appearance. She is normal weight.  HENT:     Head: Normocephalic and atraumatic.  Eyes:     Pupils: Pupils are equal, round, and reactive to light.  Cardiovascular:     Rate and Rhythm: Normal rate.     Pulses: Normal pulses.  Pulmonary:     Effort: Pulmonary effort is normal.  Abdominal:     General: Abdomen is flat. Bowel sounds are normal.     Palpations: Abdomen is soft.  Genitourinary:    General: Normal vulva.  Neurological:     General: No focal deficit present.     Mental Status: She is alert.    Results for orders placed or performed during the hospital encounter of 07/01/21 (from the past 24 hour(s))  Type and screen     Status: None   Collection Time: 07/01/21 11:00 AM  Result Value Ref Range   ABO/RH(D) O POS    Antibody Screen NEG    Sample Expiration      07/04/2021,2359 Performed at [facility], [address] [place], [place]     [address] [place] Comp Less 14 Wks  Result Date: 06/29/2021 CLINICAL  DATA:  Pregnancy of unknown location. Bleeding. Low beta HCG EXAM: OBSTETRIC <14 WK ULTRASOUND TECHNIQUE: Transabdominal ultrasound was performed for evaluation of the gestation as well as the maternal uterus and adnexal regions. COMPARISON:  06/23/2021 FINDINGS: Intrauterine gestational sac: None Yolk sac:  Not Visualized. Embryo:  Not Visualized. Cardiac Activity: Not Visualized. Heart Rate:  bpm MSD:    mm    w     d CRL:     mm    w  d                  08/23/2021 EDC: Subchorionic hemorrhage:  None visualized. Maternal uterus/adnexae: No adnexal mass or free fluid. IMPRESSION: No intrauterine gestation visualized. Correlation with serial quantitative  beta HCG and ultrasound recommended. Electronically Signed   By: Charlett Nose M.D.   On: 06/29/2021 17:05    Assessment/Plan: - Repeat US confirms no IUP, no EUP but abnormal betas. We will do D&E with frozen for villi. If villi present, no additional follow up needed.  If no villi, plan will be for MTX post delivery - Will discuss flagyl with her for BV - Will discuss post-surgery birth control for her as well.   Milas Hock 07/01/2021, 12:57 PM

## 2021-07-01 NOTE — Progress Notes (Signed)
Pacu Nursing Note  Called Pharmacy and Maternity to get informations on giving Methotrexate IM. Gave at 740pm IM R gluteus. Pt tolerated well, no signs/symptoms of any problems.   Kept and monitored pt for 25 min after. Pt doing well, VSS, having slight cramping.   D/c Instructions given to boyfriend, follow up appt discussed and what to expect after admin of Methotrexate and bleeding and when to call Dr with concerns.

## 2021-07-01 NOTE — Transfer of Care (Signed)
Immediate Anesthesia Transfer of Care Note  Patient: Susan Krause  Procedure(s) Performed: DILATATION AND CURETTAGE WITH PATHOLOGY (Vagina )  Patient Location: PACU  Anesthesia Type:General  Level of Consciousness: awake, drowsy and patient cooperative  Airway & Oxygen Therapy: Patient Spontanous Breathing  Post-op Assessment: Report given to RN, Post -op Vital signs reviewed and stable and Patient moving all extremities  Post vital signs: Reviewed and stable  Last Vitals:  Vitals Value Taken Time  BP 126/81 07/01/21 1501  Temp    Pulse 83 07/01/21 1501  Resp 15 07/01/21 1501  SpO2 100 % 07/01/21 1501  Vitals shown include unvalidated device data.  Last Pain:  Vitals:   07/01/21 1100  TempSrc:   PainSc: 0-No pain         Complications: No notable events documented.

## 2021-07-01 NOTE — Op Note (Signed)
Preop: Pregnancy of unknown location with abnormal beta HCG Postop: Same Op: Dilation and evacuation Surgeon: Dr. Para March Assist: None Anesthesia: LMA EBL 5 cc Complications: None  Findings: Normal anteverted uterus, sounded to 7 cm, minimal contents. Frozen specimen showed no evidence villi.   Description of the procedure: Preop antibiotics of doxycycline given. Informed consent reviewed and signed. Pt given opportunity to ask questions.   Pt prepped and draped in the dorsal lithotomy fashion after LMA anesthesia found to be adequate. Timeout performed.   Open-sided speculum placed into the vagina. Cervix grasped with a single tooth tenaculum. Cervix progressively dilated to a 21 pratt.  I used a 6 mm soft curette. Two passes done with the suction curette and the tissue was retrieved but none on the second pass. Minimal bleeding noted. Procedure completed. All instruments removed. Counts correct x2.   Products of conception sent to pathology.   Pt taken to recovery room in stable condition.

## 2021-07-01 NOTE — Progress Notes (Signed)
Pt reports that pain has now resolved. Resting comfortably on stretcher at this time.

## 2021-07-01 NOTE — Progress Notes (Signed)
Pacu Discharge Note  Patient instuctions were given to family. Wound care, diet, pain, follow up care and how and whom to contact with concerns were discussed. Family aware someone needs to remain with patient overnight and concerns after receiving anesthesia and what to avoid and safety. Answered all questions and concerns.   Discharge paperwork has clear contact informations for surgeon and 24 hour RN line for concerns.   Discussed what concerns to look for including infection and signs/symptoms to look for.   Gave discharge instructions about Methotrexate and expectations of medication and when to call Dr if she has any adverse symptoms. She is aware to call [facility] tomorrow to schedule a follow up appointment to check Hcg levels on day 4 & 7.   IV was removed prior to discharge. Patient was brought to car with belongings.   Pt exits my care.

## 2021-07-01 NOTE — Progress Notes (Signed)
Pt reports that she had sudden onset of RLQ "pinching" after going to restroom. Denies any nausea, bleeding, or any other associated signs and symptoms at this time. Pt okay w/receiving ordered tylenol at this time, will wait to administer Toradol after pt speaks w/surgeon.

## 2021-07-02 ENCOUNTER — Other Ambulatory Visit: Payer: Self-pay

## 2021-07-02 ENCOUNTER — Telehealth: Payer: Self-pay | Admitting: *Deleted

## 2021-07-02 ENCOUNTER — Encounter (HOSPITAL_COMMUNITY): Payer: Self-pay | Admitting: Obstetrics and Gynecology

## 2021-07-02 DIAGNOSIS — B9689 Other specified bacterial agents as the cause of diseases classified elsewhere: Secondary | ICD-10-CM

## 2021-07-02 DIAGNOSIS — N760 Acute vaginitis: Secondary | ICD-10-CM

## 2021-07-02 LAB — SURGICAL PATHOLOGY

## 2021-07-02 MED ORDER — METRONIDAZOLE 500 MG PO TABS: 500.0000 mg | ORAL_TABLET | Freq: Two times a day (BID) | ORAL | 0 refills | Status: DC

## 2021-07-02 NOTE — Progress Notes (Signed)
Flagyl sent in per Dr.Leggett for BV

## 2021-07-02 NOTE — Telephone Encounter (Signed)
Patient advised of Dr. Lianne Bushy message to have HCG drawn on Saturday at [facility] since the office and [facility] in the building are not open on Saturdays.

## 2021-07-03 ENCOUNTER — Inpatient Hospital Stay (HOSPITAL_COMMUNITY)
Admission: AD | Admit: 2021-07-03 | Discharge: 2021-07-04 | Disposition: A | Discharge status: Home / Self Care | Payer: Medicaid Other | Attending: Family Medicine | Admitting: Family Medicine

## 2021-07-03 ENCOUNTER — Inpatient Hospital Stay (HOSPITAL_COMMUNITY): Payer: Medicaid Other

## 2021-07-03 ENCOUNTER — Encounter (HOSPITAL_COMMUNITY): Payer: Self-pay | Admitting: Family Medicine

## 2021-07-03 DIAGNOSIS — R109 Unspecified abdominal pain: Secondary | ICD-10-CM | POA: Insufficient documentation

## 2021-07-03 DIAGNOSIS — O26891 Other specified pregnancy related conditions, first trimester: Secondary | ICD-10-CM | POA: Insufficient documentation

## 2021-07-03 DIAGNOSIS — M549 Dorsalgia, unspecified: Secondary | ICD-10-CM | POA: Diagnosis not present

## 2021-07-03 DIAGNOSIS — R309 Painful micturition, unspecified: Secondary | ICD-10-CM | POA: Diagnosis not present

## 2021-07-03 DIAGNOSIS — Z79899 Other long term (current) drug therapy: Secondary | ICD-10-CM | POA: Diagnosis not present

## 2021-07-03 DIAGNOSIS — Z8759 Personal history of other complications of pregnancy, childbirth and the puerperium: Secondary | ICD-10-CM | POA: Insufficient documentation

## 2021-07-03 DIAGNOSIS — O00201 Right ovarian pregnancy without intrauterine pregnancy: Secondary | ICD-10-CM

## 2021-07-03 DIAGNOSIS — Z3A12 12 weeks gestation of pregnancy: Secondary | ICD-10-CM | POA: Diagnosis not present

## 2021-07-03 DIAGNOSIS — O26899 Other specified pregnancy related conditions, unspecified trimester: Secondary | ICD-10-CM

## 2021-07-03 LAB — URINALYSIS, ROUTINE W REFLEX MICROSCOPIC
Bilirubin Urine: NEGATIVE
Glucose, UA: NEGATIVE mg/dL
Hgb urine dipstick: NEGATIVE
Ketones, ur: NEGATIVE mg/dL
Leukocytes,Ua: NEGATIVE
Nitrite: NEGATIVE
Protein, ur: NEGATIVE mg/dL
Specific Gravity, Urine: 1.014 (ref 1.005–1.030)
pH: 7 (ref 5.0–8.0)

## 2021-07-03 LAB — HCG, QUANTITATIVE, PREGNANCY: hCG, Beta Chain, Quant, S: 110 m[IU]/mL — ABNORMAL HIGH (ref <5)

## 2021-07-03 MED ORDER — IBUPROFEN 800 MG PO TABS
400.0000 mg | ORAL_TABLET | Freq: Once | ORAL | Status: DC
  Filled 2021-07-03: qty 1

## 2021-07-03 MED ORDER — IBUPROFEN 800 MG PO TABS
800.0000 mg | ORAL_TABLET | Freq: Once | ORAL | Status: AC
  Administered 2021-07-03: 800 mg via ORAL

## 2021-07-03 MED ORDER — MORPHINE SULFATE (PF) 4 MG/ML IV SOLN: 4.0000 mg | Freq: Once | INTRAVENOUS | Status: DC

## 2021-07-03 MED ORDER — OXYCODONE-ACETAMINOPHEN 5-325 MG PO TABS
2.0000 | ORAL_TABLET | Freq: Once | ORAL | Status: DC
  Filled 2021-07-03: qty 2

## 2021-07-03 NOTE — Telephone Encounter (Signed)
Susan Krause had requested phone call.   Spoke with [name]. Pain is on her right back and radiates into her leg. She has not taken anything for it yet. Worse with movement. She does report it has let up some.   Had nosebleed today.   Reviewed measures to help with discomfort - rest, heating pad, ibuprofen.   Discussed typical side effects of MTX.   Encouraged Beta tomorrow and then Day 7. She understands we will call after the day 7 beta results with next steps.

## 2021-07-03 NOTE — MAU Note (Signed)
.  Susan Krause is a 30 y.o. at [redacted]w[redacted]d here in [facility] reporting: patient is having lower right sided back and abdominal pain that started x1 hour ago. States she had a d&c on Wednesday that turned out to be right sided ectopic she received mtx for. Has not taken any medication for the pain.   Pain score: 9

## 2021-07-03 NOTE — MAU Provider Note (Signed)
History     CSN: [id number]  Arrival date and time: 07/03/21 2048   Event Date/Time   First Provider Initiated Contact with Patient 07/03/21 2113      Chief Complaint  Patient presents with   Abdominal Pain   Back Pain   Susan Krause is a 30 y.o. [id number] who received MTX on 07/01/2021.  She presents today for Abdominal Pain and Back Pain.  She states she started having back pain, that radiates to her abdomen, around 0900 but subsided without intervention.  She reports it was localized to her right side and then it improved once she "chilled out."  Patient states the pain returned on 2000 and has been constant.  She describes it as "sharp, pulsating, throbbing."  It starts on the right and radiates to her umbilicus and then radiates to her tailbone and left side.  She reports "a lot of it is in the lower abdomen and middle."  Patient reports some pain with urination, but in regards to her abdomen.  She reports she it is worsened with movement and has no improving factors.  She states "it doesn't go away."  She reports she has tried position changes, but no other interventions. She rates the pain a 8-9/10.  Patient expresses concern that the pain is similar to when she had an ectopic pregnancy in the past.  Patient also questions if it is normal to experience such pain s/p injection.  She also questions if "the pregnancy will dissolve with one shot."    OB History     Gravida  4   Para  1   Term  1   Preterm      AB  2   Living  1      SAB  1   IAB      Ectopic  1   Multiple  0   Live Births  1           Past Medical History:  Diagnosis Date   ADHD (attention deficit hyperactivity disorder)    No meds   Cervical dysplasia    Chlamydia    Dyspnea    06/30/2020- climbing stairs- while I'm pregnant   Head injury with loss of consciousness (HCC) 03/2020   Migraine    Propanolol 10 mg   OCD (obsessive compulsive disorder)    PTSD (post-traumatic stress disorder)     no meds   PVC (premature ventricular contraction)    PVC's (premature ventricular contractions) 2018    Past Surgical History:  Procedure Laterality Date   CESAREAN SECTION N/A 07/18/2018   Procedure: CESAREAN SECTION;  Surgeon: Essie Hart, MD;  Location: [facility] BIRTHING SUITES;  Service: Obstetrics;  Laterality: N/A;   DIAGNOSTIC LAPAROSCOPY WITH REMOVAL OF ECTOPIC PREGNANCY Left 12/20/2020   Procedure: LAPAROSCOPIC LEFT SALPINGECTOMY WITH REMOVAL OF ECTOPIC PREGNANCY;  Surgeon: [name], MD;  Location: [facility] OR;  Service: Gynecology;  Laterality: Left;   DILATION AND CURETTAGE OF UTERUS N/A 07/01/2021   Procedure: DILATATION AND CURETTAGE WITH PATHOLOGY;  Surgeon: Milas Hock, MD;  Location: [facility] OR;  Service: Gynecology;  Laterality: N/A;   SALPINGECTOMY Left 12/2020   WISDOM TOOTH EXTRACTION      Family History  Problem Relation Age of Onset   ADD / ADHD Mother    Anxiety disorder Mother    Depression Mother    Hyperlipidemia Mother    Obesity Mother    ADD / ADHD Father    Anxiety disorder Sister  Asthma Sister    Depression Sister    Intellectual disability Brother    Learning disabilities Brother    ADD / ADHD Sister    Anxiety disorder Maternal Aunt    Diabetes Maternal Aunt    Alcohol abuse Maternal Uncle    Hyperlipidemia Maternal Uncle    Anxiety disorder Paternal Uncle    Diabetes Paternal Uncle    Anxiety disorder Maternal Grandmother    Diabetes Maternal Grandmother    Hyperlipidemia Maternal Grandmother    Varicose Veins Maternal Grandmother    Cancer Maternal Grandfather    Hyperlipidemia Maternal Grandfather    Obesity Maternal Grandfather    Vision loss Maternal Grandfather    Cancer Paternal Grandmother     Social History   Tobacco Use   Smoking status: Never   Smokeless tobacco: Never  Vaping Use   Vaping Use: Never used  Substance Use Topics   Alcohol use: No   Drug use: No    Allergies:  Allergies  Allergen Reactions   Banana  Swelling   Latex Swelling   Contrast Media [Iodinated Diagnostic Agents] Nausea And Vomiting   Gardasil [Hpv 4-Valent Vaccine Recombinant Vaccine]    Hpv Bival (Type 16,18) Recomb Vaccine  [Human Papillomavirus (16,18) Recomb Vac] Hives   Peanut Oil Swelling    Tongue swells/itching   Pumpkin Flavor Swelling   Lactose Intolerance (Gi)     Medications Prior to Admission  Medication Sig Dispense Refill Last Dose   acetaminophen (TYLENOL) 500 MG tablet Take 1,000 mg by mouth every 6 (six) hours as needed for mild pain.   Past Week   ibuprofen (ADVIL) 600 MG tablet Take 1 tablet (600 mg total) by mouth every 6 (six) hours as needed. 60 tablet 3 Past Week   labetalol (NORMODYNE) 100 MG tablet Take 100 mg by mouth 2 (two) times daily.   07/02/2021   metroNIDAZOLE (FLAGYL) 500 MG tablet Take 1 tablet (500 mg total) by mouth 2 (two) times daily. 14 tablet 0 07/03/2021   albuterol (VENTOLIN HFA) 108 (90 Base) MCG/ACT inhaler Inhale 1-2 puffs into the lungs every 6 (six) hours as needed for wheezing or shortness of breath.      fluticasone (FLONASE) 50 MCG/ACT nasal spray Place 1 spray into both nostrils daily as needed for allergies.      norethindrone (MICRONOR) 0.35 MG tablet Take 1 tablet (0.35 mg total) by mouth daily. 30 tablet 3    Prenatal Vit-Fe Fumarate-FA (MULTIVITAMIN-PRENATAL) 27-0.8 MG TABS tablet Take 1 tablet by mouth daily at 12 noon.       Review of Systems  Gastrointestinal:  Positive for abdominal pain and nausea. Negative for constipation, diarrhea and vomiting.  Genitourinary:  Positive for pelvic pain and vaginal discharge (Light pink spotting). Negative for difficulty urinating, dysuria and vaginal bleeding.  Musculoskeletal:  Positive for back pain.  Neurological:  Negative for dizziness, light-headedness and headaches.  Physical Exam   Blood pressure (!) 150/94, pulse 88, temperature 98.6 F (37 C), temperature source Oral, resp. rate 20, last menstrual period  04/13/2021, SpO2 100 %. Vitals:   07/03/21 2105 07/03/21 2145  BP: (!) 150/94 124/79  Pulse: 88 74  Resp: 20   Temp: 98.6 F (37 C)   SpO2: 100%      Physical Exam Vitals reviewed.  Constitutional:      Appearance: She is well-developed.  HENT:     Head: Normocephalic and atraumatic.  Cardiovascular:     Rate and Rhythm: Normal rate and  regular rhythm.  Pulmonary:     Effort: Pulmonary effort is normal.     Breath sounds: Normal breath sounds.  Abdominal:     General: Abdomen is flat. Bowel sounds are normal. There is no distension.     Palpations: Abdomen is soft.     Tenderness: There is abdominal tenderness in the right lower quadrant, periumbilical area, suprapubic area and left lower quadrant.  Skin:    General: Skin is warm and dry.  Neurological:     Mental Status: She is alert and oriented to person, place, and time.  Psychiatric:        Mood and Affect: Mood normal.        Behavior: Behavior normal.    MAU Course  Procedures Results for orders placed or performed during the hospital encounter of 07/03/21 (from the past 24 hour(s))  Urinalysis, Routine w reflex microscopic Urine, Clean Catch     Status: None   Collection Time: 07/03/21  9:30 PM  Result Value Ref Range   Color, Urine YELLOW YELLOW   APPearance CLEAR CLEAR   Specific Gravity, Urine 1.014 1.005 - 1.030   pH 7.0 5.0 - 8.0   Glucose, UA NEGATIVE NEGATIVE mg/dL   Hgb urine dipstick NEGATIVE NEGATIVE   Bilirubin Urine NEGATIVE NEGATIVE   Ketones, ur NEGATIVE NEGATIVE mg/dL   Protein, ur NEGATIVE NEGATIVE mg/dL   Nitrite NEGATIVE NEGATIVE   Leukocytes,Ua NEGATIVE NEGATIVE  hCG, quantitative, pregnancy     Status: Abnormal   Collection Time: 07/03/21  9:49 PM  Result Value Ref Range   hCG, Beta Chain, Quant, S 110 (H) <5 mIU/mL   US OB Transvaginal  Result Date: 07/03/2021 CLINICAL DATA:  Known ectopic pregnancy status post methotrexate. EXAM: TRANSVAGINAL OB ULTRASOUND TECHNIQUE:  Transvaginal ultrasound was performed for complete evaluation of the gestation as well as the maternal uterus, adnexal regions, and pelvic cul-de-sac. COMPARISON:  06/29/2021, 06/23/2021, 05/19/2021 FINDINGS: Intrauterine gestational sac: None visualized Yolk sac:  Not visualized Embryo:  Not visualized Maternal uterus/adnexae: Moderate mobile probable hemorrhagic fluid in the endometrial canal. Right ovary measures 1.9 by 3.7 x 1.9 cm. Right adnexal mass adjacent to the ovary measuring 1.4 by 2 x 2.2 cm. Left ovary measures 2.2 x 1.1 x 1.9 cm. IMPRESSION: 1. No IUP identified. Moderate probable hemorrhagic fluid in the endometrial canal. 2.2 cm right adnexal mass adjacent to the ovary presumably corresponding to history of ectopic pregnancy, this was not demonstrated on the previous ultrasound images. 2. No significant free fluid in the pelvis Electronically Signed   By: Jasmine Pang M.D.   On: 07/03/2021 23:55     MDM Exam Labs: hCG, UA Pain Medication Consult Ultrasound Assessment and Plan  30 year old [id number] S/P D&C Pregnancy of Unknown Location  -POC Reviewed. -Informed that will start process with hCG as patient expresses concern with rising levels. -Reassured that normal to have rise in hCG between days 4 and 7. -Discussed formulation in POC as results arise. -Will also collect UA. -Patient offered pain medication and accepts with condition of no narcotics. -Will give one time dose of ibuprofen -Will await results and monitor.   Cherre Robins 07/03/2021, 9:13 PM   Reassessment (11:10 PM) -hCG results return with insignificant rise. -Provider to bedside to review results and reassess. -Patient reports pain continues despite medication, but has improved to 5/10 and increases only with movement. -Informed that MD will be consulted, but likely will send for [place] to confirm no significant changes suggestive  of ruptured ectopic. -Dr. Gildardo Griffes consulted and informed of patient  status, evaluation, interventions, and results. Advised: *Proceed with [place]. -[place] ordered. Will await results.   Reassessment (12:04 AM) -Patient reports increased pain. -Continues to decline IM medications. -Also refusing Percocet stating that she was told tylenol usage should be avoided in conjunction with MTX injection. -Will give oxycodone 5mg  - results return with possible ectopic identified, but no free fluid. -Will allow medication to work and return to bedside to assess.   Reassessment (1:15 AM) -Provider to bedside.  -Results reviewed.  -Patient tearful and reflects on fear of having another ectopic pregnancy.  -Patient stating she doesn't feel safe going home.  Further questions what she should do when she gets home. -Provider advises rest and usage of limited pain medication as well as reaching out if symptoms worsen or with onset of new symptoms. -Patient expresses desire for surgical intervention. Informed that this could result in lose of fallopian tube causing sterilization. Furthermore, patient currently stable and no indication for surgical intervention at this time.  -Provider to call attending and discuss alternative methods/treatment. -Dr. [place] updated on patient status and desire for surgical intervention.  Reports she has reviewed Shawnie Pons and does not recommend.  Instructs provider to have patient follow up as scheduled and monitor symptoms.  Advised usage of flexeril and/or tramadol for pain management.  Provide patient with reassurance regarding current pain/discomfort. -Provider to bedside to discuss MD recommendations. -Patient agreeable with plan. -Discussed usage of tramadol/flexeril as needed for pain. Advised to not use in conjunction with each other. -Continues to express desire for removal of fallopian tube.  Provider reiterates that no current justification as patient is stable. -Comfort given and patient allowed to vent frustrations and concerns.  -Plan to  follow up as scheduled on Tuesday.  -Encouraged to call primary office or return to MAU if symptoms worsen or with the onset of new symptoms. -Discharged to home in stable condition.   Friday MSN, CNM Advanced Practice Provider, [facility]

## 2021-07-04 MED ORDER — OXYCODONE HCL 5 MG PO TABS
5.0000 mg | ORAL_TABLET | Freq: Once | ORAL | Status: AC
  Administered 2021-07-04: 5 mg via ORAL
  Filled 2021-07-04: qty 1

## 2021-07-04 MED ORDER — TRAMADOL HCL 50 MG PO TABS: 50.0000 mg | ORAL_TABLET | Freq: Four times a day (QID) | ORAL | 0 refills | Status: DC | PRN

## 2021-07-04 MED ORDER — CYCLOBENZAPRINE HCL 10 MG PO TABS: 10.0000 mg | ORAL_TABLET | Freq: Two times a day (BID) | ORAL | 0 refills | Status: DC | PRN

## 2021-07-06 ENCOUNTER — Telehealth: Payer: Self-pay

## 2021-07-06 ENCOUNTER — Inpatient Hospital Stay (HOSPITAL_COMMUNITY): Payer: Medicaid Other

## 2021-07-06 ENCOUNTER — Inpatient Hospital Stay (HOSPITAL_COMMUNITY)
Admission: AD | Admit: 2021-07-06 | Discharge: 2021-07-06 | Disposition: A | Discharge status: Home / Self Care | Payer: Medicaid Other | Attending: Family Medicine | Admitting: Family Medicine

## 2021-07-06 DIAGNOSIS — Z9079 Acquired absence of other genital organ(s): Secondary | ICD-10-CM | POA: Diagnosis not present

## 2021-07-06 DIAGNOSIS — O09291 Supervision of pregnancy with other poor reproductive or obstetric history, first trimester: Secondary | ICD-10-CM | POA: Insufficient documentation

## 2021-07-06 DIAGNOSIS — O00101 Right tubal pregnancy without intrauterine pregnancy: Secondary | ICD-10-CM

## 2021-07-06 DIAGNOSIS — O09292 Supervision of pregnancy with other poor reproductive or obstetric history, second trimester: Secondary | ICD-10-CM | POA: Insufficient documentation

## 2021-07-06 DIAGNOSIS — O26891 Other specified pregnancy related conditions, first trimester: Secondary | ICD-10-CM | POA: Diagnosis present

## 2021-07-06 DIAGNOSIS — Z3A12 12 weeks gestation of pregnancy: Secondary | ICD-10-CM | POA: Insufficient documentation

## 2021-07-06 DIAGNOSIS — O26899 Other specified pregnancy related conditions, unspecified trimester: Secondary | ICD-10-CM

## 2021-07-06 DIAGNOSIS — O26892 Other specified pregnancy related conditions, second trimester: Secondary | ICD-10-CM | POA: Diagnosis not present

## 2021-07-06 LAB — CBC
HCT: 38.9 % (ref 36.0–46.0)
Hemoglobin: 12.8 g/dL (ref 12.0–15.0)
MCH: 28.4 pg (ref 26.0–34.0)
MCHC: 32.9 g/dL (ref 30.0–36.0)
MCV: 86.4 fL (ref 80.0–100.0)
Platelets: 319 10*3/uL (ref 150–400)
RBC: 4.5 MIL/uL (ref 3.87–5.11)
RDW: 12 % (ref 11.5–15.5)
WBC: 5.5 10*3/uL (ref 4.0–10.5)
nRBC: 0 % (ref 0.0–0.2)

## 2021-07-06 LAB — COMPREHENSIVE METABOLIC PANEL
ALT: 41 U/L (ref 0–44)
AST: 36 U/L (ref 15–41)
Albumin: 4 g/dL (ref 3.5–5.0)
Alkaline Phosphatase: 49 U/L (ref 38–126)
Anion gap: 9 (ref 5–15)
BUN: 11 mg/dL (ref 6–20)
CO2: 27 mmol/L (ref 22–32)
Calcium: 9.3 mg/dL (ref 8.9–10.3)
Chloride: 101 mmol/L (ref 98–111)
Creatinine, Ser: 0.65 mg/dL (ref 0.44–1.00)
GFR, Estimated: >60 mL/min (ref >60)
Glucose, Bld: 80 mg/dL (ref 70–99)
Potassium: 3.8 mmol/L (ref 3.5–5.1)
Sodium: 137 mmol/L (ref 135–145)
Total Bilirubin: 0.6 mg/dL (ref 0.3–1.2)
Total Protein: 6.9 g/dL (ref 6.5–8.1)

## 2021-07-06 LAB — HCG, QUANTITATIVE, PREGNANCY: hCG, Beta Chain, Quant, S: 70 m[IU]/mL — ABNORMAL HIGH (ref <5)

## 2021-07-06 NOTE — MAU Note (Signed)
Pt reports she was dx with ectopic pregnancy after doing a d&c.  Received MTX on Wed.  Came back on Friday night with pain.  Given some pain medication and told to come back on Tues for repeat BHCG. Pt has increased it is in her RLQ and in her back. Pain medication is not helping.

## 2021-07-06 NOTE — MAU Provider Note (Signed)
History     CSN: [id number]  Arrival date and time: 07/06/21 1057   Event Date/Time   First Provider Initiated Contact with Patient 07/06/21 1210        Chief Complaint  Patient presents with   Abdominal Pain   HPI This is a 30 yo G4P1021 at 12w by LMP. Pregnancy complicated with prior left ectopic pregnancy and is s/p left salpingectomy in 12/2020. She also had an early SAB in September with HCG levels that resolved to 11-12. She subsequently became pregnant in October and has had an inappropriately rising HCG. On 10/19, she had a D&C. Intraoperative pathology showed no products of conception and she was given a dose of methotrexate postoperatively. On 10/21, she was having increased pain, returned to the MAU and had an US showing a 2.2cm right ectopic pregnancy with no free fluid in the pelvis. Since that time, she has been having increased pain in her right pelvis, worse with movement. The pain radiates into her right upper quadrant. No fevers, chills, nausea. Some decreased appetite. Does report swelling of her stomach.  OB History     Gravida  4   Para  1   Term  1   Preterm      AB  2   Living  1      SAB  1   IAB      Ectopic  1   Multiple  0   Live Births  1           Past Medical History:  Diagnosis Date   ADHD (attention deficit hyperactivity disorder)    No meds   Cervical dysplasia    Chlamydia    Dyspnea    06/30/2020- climbing stairs- while I'm pregnant   Head injury with loss of consciousness (HCC) 03/2020   Migraine    Propanolol 10 mg   OCD (obsessive compulsive disorder)    PTSD (post-traumatic stress disorder)    no meds   PVC (premature ventricular contraction)    PVC's (premature ventricular contractions) 2018    Past Surgical History:  Procedure Laterality Date   CESAREAN SECTION N/A 07/18/2018   Procedure: CESAREAN SECTION;  Surgeon: Essie Hart, MD;  Location: [facility] BIRTHING SUITES;  Service: Obstetrics;  Laterality: N/A;    DIAGNOSTIC LAPAROSCOPY WITH REMOVAL OF ECTOPIC PREGNANCY Left 12/20/2020   Procedure: LAPAROSCOPIC LEFT SALPINGECTOMY WITH REMOVAL OF ECTOPIC PREGNANCY;  Surgeon: [name], MD;  Location: [facility] OR;  Service: Gynecology;  Laterality: Left;   DILATION AND CURETTAGE OF UTERUS N/A 07/01/2021   Procedure: DILATATION AND CURETTAGE WITH PATHOLOGY;  Surgeon: Milas Hock, MD;  Location: [facility] OR;  Service: Gynecology;  Laterality: N/A;   SALPINGECTOMY Left 12/2020   WISDOM TOOTH EXTRACTION      Family History  Problem Relation Age of Onset   ADD / ADHD Mother    Anxiety disorder Mother    Depression Mother    Hyperlipidemia Mother    Obesity Mother    ADD / ADHD Father    Anxiety disorder Sister    Asthma Sister    Depression Sister    Intellectual disability Brother    Learning disabilities Brother    ADD / ADHD Sister    Anxiety disorder Maternal Aunt    Diabetes Maternal Aunt    Alcohol abuse Maternal Uncle    Hyperlipidemia Maternal Uncle    Anxiety disorder Paternal Uncle    Diabetes Paternal Uncle    Anxiety disorder Maternal Grandmother  Diabetes Maternal Grandmother    Hyperlipidemia Maternal Grandmother    Varicose Veins Maternal Grandmother    Cancer Maternal Grandfather    Hyperlipidemia Maternal Grandfather    Obesity Maternal Grandfather    Vision loss Maternal Grandfather    Cancer Paternal Grandmother     Social History   Tobacco Use   Smoking status: Never   Smokeless tobacco: Never  Vaping Use   Vaping Use: Never used  Substance Use Topics   Alcohol use: No   Drug use: No    Allergies:  Allergies  Allergen Reactions   Banana Swelling   Latex Swelling   Contrast Media [Iodinated Diagnostic Agents] Nausea And Vomiting   Gardasil [Hpv 4-Valent Vaccine Recombinant Vaccine]    Hpv Bival (Type 16,18) Recomb Vaccine  [Human Papillomavirus (16,18) Recomb Vac] Hives   Peanut Oil Swelling    Tongue swells/itching   Pumpkin Flavor Swelling   Lactose  Intolerance (Gi)     Medications Prior to Admission  Medication Sig Dispense Refill Last Dose   cyclobenzaprine (FLEXERIL) 10 MG tablet Take 1 tablet (10 mg total) by mouth 2 (two) times daily as needed for muscle spasms. 20 tablet 0    ibuprofen (ADVIL) 600 MG tablet Take 1 tablet (600 mg total) by mouth every 6 (six) hours as needed. 60 tablet 3    labetalol (NORMODYNE) 100 MG tablet Take 100 mg by mouth 2 (two) times daily.      metroNIDAZOLE (FLAGYL) 500 MG tablet Take 1 tablet (500 mg total) by mouth 2 (two) times daily. 14 tablet 0    traMADol (ULTRAM) 50 MG tablet Take 1 tablet (50 mg total) by mouth every 6 (six) hours as needed. 10 tablet 0     Review of Systems Physical Exam   Blood pressure 116/89, pulse (!) 105, temperature 98.4 F (36.9 C), resp. rate 18, height 5\' 2"  (1.575 m), weight 80.1 kg, last menstrual period 04/13/2021.  Physical Exam Vitals reviewed.  Constitutional:      Appearance: She is well-developed.  HENT:     Head: Normocephalic and atraumatic.  Cardiovascular:     Rate and Rhythm: Normal rate and regular rhythm.  Pulmonary:     Effort: Pulmonary effort is normal.     Breath sounds: Normal breath sounds.  Abdominal:     Palpations: Abdomen is soft.     Tenderness: There is abdominal tenderness. There is guarding and rebound. There is no right CVA tenderness or left CVA tenderness.  Neurological:     Mental Status: She is alert.   Results for orders placed or performed during the hospital encounter of 07/06/21 (from the past 24 hour(s))  CBC     Status: None   Collection Time: 07/06/21 12:17 PM  Result Value Ref Range   WBC 5.5 4.0 - 10.5 K/uL   RBC 4.50 3.87 - 5.11 MIL/uL   Hemoglobin 12.8 12.0 - 15.0 g/dL   HCT 07/08/21 45.4 - 09.8 %   MCV 86.4 80.0 - 100.0 fL   MCH 28.4 26.0 - 34.0 pg   MCHC 32.9 30.0 - 36.0 g/dL   RDW 11.9 14.7 - 82.9 %   Platelets 319 150 - 400 K/uL   nRBC 0.0 0.0 - 0.2 %  Comprehensive metabolic panel     Status: None    Collection Time: 07/06/21 12:17 PM  Result Value Ref Range   Sodium 137 135 - 145 mmol/L   Potassium 3.8 3.5 - 5.1 mmol/L   Chloride 101  98 - 111 mmol/L   CO2 27 22 - 32 mmol/L   Glucose, Bld 80 70 - 99 mg/dL   BUN 11 6 - 20 mg/dL   Creatinine, Ser 1.51 0.44 - 1.00 mg/dL   Calcium 9.3 8.9 - 76.1 mg/dL   Total Protein 6.9 6.5 - 8.1 g/dL   Albumin 4.0 3.5 - 5.0 g/dL   AST 36 15 - 41 U/L   ALT 41 0 - 44 U/L   Alkaline Phosphatase 49 38 - 126 U/L   Total Bilirubin 0.6 0.3 - 1.2 mg/dL   GFR, Estimated >60 >73 mL/min   Anion gap 9 5 - 15  hCG, quantitative, pregnancy     Status: Abnormal   Collection Time: 07/06/21 12:17 PM  Result Value Ref Range   hCG, Beta Chain, Quant, S 70 (H) <5 mIU/mL   US OB LESS THAN 14 WEEKS WITH OB TRANSVAGINAL  Result Date: 07/06/2021 CLINICAL DATA:  Abdominal pain EXAM: OBSTETRIC <14 WK [place] AND TRANSVAGINAL OB US TECHNIQUE: Transvaginal ultrasound was performed for complete evaluation of the gestation as well as the maternal uterus, adnexal regions, and pelvic cul-de-sac. COMPARISON:  Obstetric ultrasound 07/03/2021 FINDINGS: Intrauterine gestational sac: None Yolk sac:  Not Visualized. Embryo:  Not Visualized. Cardiac Activity: Not Visualized. Maternal uterus/adnexae: Moderate complex free fluid is again seen in the pelvis. There is a 0.8 cm x 1.3 cm structure in the right adnexa which was also seen on the prior study. The right ovary itself is normal in appearance measuring 4.4 cm x 1.9 cm x 2.7 cm. IMPRESSION: Abnormal structure again seen in the right adnexa suspicious for an ectopic pregnancy. There is new moderate volume complex fluid in the pelvis concerning for interval hemorrhage. OB consultation is recommended. These results were called by telephone at the time of interpretation on 07/06/2021 at 3:43 pm to provider Candelaria Celeste , who verbally acknowledged these results. Electronically Signed   By: Lesia Hausen M.D.   On: 07/06/2021 15:44     MAU Course   Procedures   MDM Imaging: I independent reviewed the images of the ultrasound. Ectopic pregnancy seen measuring 0.8cm x 1.3cm, which is smaller than previously seen. Small amount of complex fluid in pelvis.  Discussed pt with Dr Despina Hidden, who reviewed the [place] and examined the patient. Does not think that the patient had a ruptured ectopic - thinks that she is having a small amount of bleeding from the tube. Size of ectopic smaller and quant decreasing. Thinks that it is ok to proceed with conservative management. F/u Wednesday in office.  Assessment and Plan   1. Right tubal pregnancy without intrauterine pregnancy   2. Pelvic pain affecting pregnancy    Discharge to home. Return precautions given. Modified bedrest recommended. Pain control with tylenol, ibuprofen.   F/u in office on Wednesday.   Levie Heritage 07/06/2021, 3:48 PM

## 2021-07-06 NOTE — Telephone Encounter (Signed)
Pt sent MyChart message over the weekend stating she was in severe pain since surgery. Pt was seen in MAU over the weekend. I responded to MyChart message and told pt to call office for appt today. Pt called office and I offered her an appt in office today. Pt states she would rather go to MAU.

## 2021-07-07 ENCOUNTER — Other Ambulatory Visit: Payer: Medicaid Other

## 2021-07-08 ENCOUNTER — Ambulatory Visit (INDEPENDENT_AMBULATORY_CARE_PROVIDER_SITE_OTHER): Payer: Medicaid Other

## 2021-07-08 ENCOUNTER — Other Ambulatory Visit: Payer: Self-pay

## 2021-07-08 VITALS — BP 130/87 | HR 88 | Ht 60.0 in | Wt 176.0 lb

## 2021-07-08 DIAGNOSIS — O009 Unspecified ectopic pregnancy without intrauterine pregnancy: Secondary | ICD-10-CM | POA: Diagnosis not present

## 2021-07-08 NOTE — Progress Notes (Signed)
History:  Ms. Charissa Knowles is a 30 y.o. [phone] who presents to clinic today for follow up from ectopic pregnancy. She reports she started to feel better yesterday and was more active so she has more pain today. States the pain is consistent and not worsening. Reports some vaginal bleeding.    The following portions of the patient's history were reviewed and updated as appropriate: allergies, current medications, family history, past medical history, social history, past surgical history and problem list.  Review of Systems:  Review of Systems  Constitutional: Negative.  Negative for chills and fever.  Respiratory: Negative.    Cardiovascular: Negative.  Negative for chest pain.  Genitourinary: Negative.   Neurological: Negative.  Negative for dizziness and headaches.     Objective:  Physical Exam BP 130/87   Pulse 88   Ht 5' (1.524 m)   Wt 176 lb (79.8 kg)   LMP 04/13/2021   BMI 34.37 kg/m  Physical Exam Vitals and nursing note reviewed.  Constitutional:      General: She is not in acute distress.    Appearance: She is well-developed.  HENT:     Head: Normocephalic.  Eyes:     Pupils: Pupils are equal, round, and reactive to light.  Cardiovascular:     Rate and Rhythm: Normal rate and regular rhythm.  Pulmonary:     Effort: Pulmonary effort is normal. No respiratory distress.     Breath sounds: Normal breath sounds.  Abdominal:     Palpations: Abdomen is soft.     Tenderness: There is no abdominal tenderness.  Musculoskeletal:        General: Normal range of motion.     Cervical back: Normal range of motion.  Skin:    General: Skin is warm and dry.  Neurological:     Mental Status: She is alert and oriented to person, place, and time.  Psychiatric:        Behavior: Behavior normal.        Thought Content: Thought content normal.        Judgment: Judgment normal.    Assessment & Plan:  1. Ectopic pregnancy, unspecified location, unspecified whether intrauterine  pregnancy present -Repeat HCG today and weekly until <5 - B-HCG Quant  -Patient appropriately tearful and expressing frustration of unfairness of this entire process. CNM validated feelings. Patient very concerned about future fertility and options for pregnancy given costs of IVF. Does not plan pregnancy until next year to give body time to heal.  -Reviewed normal expectation of pain and bleeding through this process. Warning signs of when to return reviewed at length.   Rolm Bookbinder, [place] 07/08/2021 11:15 AM

## 2021-07-09 ENCOUNTER — Inpatient Hospital Stay (HOSPITAL_COMMUNITY): Payer: Medicaid Other

## 2021-07-09 ENCOUNTER — Encounter (HOSPITAL_COMMUNITY): Payer: Self-pay | Admitting: Obstetrics & Gynecology

## 2021-07-09 ENCOUNTER — Inpatient Hospital Stay (HOSPITAL_COMMUNITY)
Admission: AD | Admit: 2021-07-09 | Discharge: 2021-07-10 | Disposition: A | Discharge status: Home / Self Care | Payer: Medicaid Other | Attending: Obstetrics & Gynecology | Admitting: Obstetrics & Gynecology

## 2021-07-09 DIAGNOSIS — Z3A12 12 weeks gestation of pregnancy: Secondary | ICD-10-CM | POA: Insufficient documentation

## 2021-07-09 DIAGNOSIS — R1031 Right lower quadrant pain: Secondary | ICD-10-CM | POA: Diagnosis not present

## 2021-07-09 DIAGNOSIS — O99891 Other specified diseases and conditions complicating pregnancy: Secondary | ICD-10-CM | POA: Diagnosis not present

## 2021-07-09 DIAGNOSIS — R109 Unspecified abdominal pain: Secondary | ICD-10-CM | POA: Diagnosis present

## 2021-07-09 DIAGNOSIS — O009 Unspecified ectopic pregnancy without intrauterine pregnancy: Secondary | ICD-10-CM

## 2021-07-09 DIAGNOSIS — R55 Syncope and collapse: Secondary | ICD-10-CM | POA: Insufficient documentation

## 2021-07-09 DIAGNOSIS — O00101 Right tubal pregnancy without intrauterine pregnancy: Secondary | ICD-10-CM | POA: Diagnosis not present

## 2021-07-09 LAB — COMPREHENSIVE METABOLIC PANEL
ALT: 35 U/L (ref 0–44)
AST: 27 U/L (ref 15–41)
Albumin: 4.4 g/dL (ref 3.5–5.0)
Alkaline Phosphatase: 49 U/L (ref 38–126)
Anion gap: 10 (ref 5–15)
BUN: 12 mg/dL (ref 6–20)
CO2: 23 mmol/L (ref 22–32)
Calcium: 9.9 mg/dL (ref 8.9–10.3)
Chloride: 103 mmol/L (ref 98–111)
Creatinine, Ser: 0.76 mg/dL (ref 0.44–1.00)
GFR, Estimated: >60 mL/min (ref >60)
Glucose, Bld: 85 mg/dL (ref 70–99)
Potassium: 3.2 mmol/L — ABNORMAL LOW (ref 3.5–5.1)
Sodium: 136 mmol/L (ref 135–145)
Total Bilirubin: 0.5 mg/dL (ref 0.3–1.2)
Total Protein: 7.5 g/dL (ref 6.5–8.1)

## 2021-07-09 LAB — CBC WITH DIFFERENTIAL/PLATELET
Abs Immature Granulocytes: 0.05 10*3/uL (ref 0.00–0.07)
Basophils Absolute: 0 10*3/uL (ref 0.0–0.1)
Basophils Relative: 0 %
Eosinophils Absolute: 0.1 10*3/uL (ref 0.0–0.5)
Eosinophils Relative: 1 %
HCT: 39.1 % (ref 36.0–46.0)
Hemoglobin: 13.7 g/dL (ref 12.0–15.0)
Immature Granulocytes: 0 %
Lymphocytes Relative: 33 %
Lymphs Abs: 3.7 10*3/uL (ref 0.7–4.0)
MCH: 29.9 pg (ref 26.0–34.0)
MCHC: 35 g/dL (ref 30.0–36.0)
MCV: 85.4 fL (ref 80.0–100.0)
Monocytes Absolute: 0.5 10*3/uL (ref 0.1–1.0)
Monocytes Relative: 5 %
Neutro Abs: 7 10*3/uL (ref 1.7–7.7)
Neutrophils Relative %: 61 %
Platelets: 399 10*3/uL (ref 150–400)
RBC: 4.58 MIL/uL (ref 3.87–5.11)
RDW: 12.5 % (ref 11.5–15.5)
WBC: 11.5 10*3/uL — ABNORMAL HIGH (ref 4.0–10.5)
nRBC: 0 % (ref 0.0–0.2)

## 2021-07-09 LAB — TYPE AND SCREEN
ABO/RH(D): O POS
Antibody Screen: NEGATIVE

## 2021-07-09 LAB — HCG, QUANTITATIVE, PREGNANCY
HCG, Total, QN: 56 m[IU]/mL
hCG, Beta Chain, Quant, S: 51 m[IU]/mL — ABNORMAL HIGH (ref <5)

## 2021-07-09 MED ORDER — MORPHINE SULFATE (PF) 4 MG/ML IV SOLN: 4.0000 mg | Freq: Once | INTRAVENOUS | Status: DC

## 2021-07-09 MED ORDER — LACTATED RINGERS IV BOLUS
1000.0000 mL | Freq: Once | INTRAVENOUS | Status: AC
  Administered 2021-07-09: 1000 mL via INTRAVENOUS

## 2021-07-09 MED ORDER — KETOROLAC TROMETHAMINE 30 MG/ML IJ SOLN
30.0000 mg | Freq: Once | INTRAMUSCULAR | Status: AC
  Administered 2021-07-09: 30 mg via INTRAVENOUS
  Filled 2021-07-09: qty 1

## 2021-07-09 NOTE — MAU Provider Note (Addendum)
History     CSN: [id number]  Arrival date and time: 07/09/21 1754   Event Date/Time   First Provider Initiated Contact with Patient 07/09/21 1838     Chief Complaint  Patient presents with   Abdominal Pain   Dizziness   Susan Krause Staff is a 30 y.o. [id number] at [redacted]w[redacted]d who receives care at [facility].  She presents today for Abdominal Pain and Dizziness. She arrived and reported feelings of syncope and was immediately escorted, via wheelchair, to room.  Nurse provides immediate interventions including IV, labs, and support.  Patient appeared to be having a panic attack.  She states around 5pm she started to experiencing sharp "constant uterine" pain that she identifies in the middle of her pelvis. Patient reports the pain is a 8/10 and feels similar to when she had her ruptured ectopic.  Patient also reports facial numbness and tingling.     OB History     Gravida  4   Para  1   Term  1   Preterm      AB  2   Living  1      SAB  1   IAB      Ectopic  1   Multiple  0   Live Births  1          Past Medical History:  Diagnosis Date   ADHD (attention deficit hyperactivity disorder)    No meds   Cervical dysplasia    Chlamydia    Dyspnea    06/30/2020- climbing stairs- while I'm pregnant   Head injury with loss of consciousness (HCC) 03/2020   Migraine    Propanolol 10 mg   OCD (obsessive compulsive disorder)    PTSD (post-traumatic stress disorder)    no meds   PVC (premature ventricular contraction)    PVC's (premature ventricular contractions) 2018   Past Surgical History:  Procedure Laterality Date   CESAREAN SECTION N/A 07/18/2018   Procedure: CESAREAN SECTION;  Surgeon: Essie Hart, MD;  Location: [facility] BIRTHING SUITES;  Service: Obstetrics;  Laterality: N/A;   DIAGNOSTIC LAPAROSCOPY WITH REMOVAL OF ECTOPIC PREGNANCY Left 12/20/2020   Procedure: LAPAROSCOPIC LEFT SALPINGECTOMY WITH REMOVAL OF ECTOPIC PREGNANCY;  Surgeon: [name], MD;  Location: [facility] OR;   Service: Gynecology;  Laterality: Left;   DILATION AND CURETTAGE OF UTERUS N/A 07/01/2021   Procedure: DILATATION AND CURETTAGE WITH PATHOLOGY;  Surgeon: Milas Hock, MD;  Location: [facility] OR;  Service: Gynecology;  Laterality: N/A;   SALPINGECTOMY Left 12/2020   WISDOM TOOTH EXTRACTION     Family History  Problem Relation Age of Onset   ADD / ADHD Mother    Anxiety disorder Mother    Depression Mother    Hyperlipidemia Mother    Obesity Mother    ADD / ADHD Father    Anxiety disorder Sister    Asthma Sister    Depression Sister    Intellectual disability Brother    Learning disabilities Brother    ADD / ADHD Sister    Anxiety disorder Maternal Aunt    Diabetes Maternal Aunt    Alcohol abuse Maternal Uncle    Hyperlipidemia Maternal Uncle    Anxiety disorder Paternal Uncle    Diabetes Paternal Uncle    Anxiety disorder Maternal Grandmother    Diabetes Maternal Grandmother    Hyperlipidemia Maternal Grandmother    Varicose Veins Maternal Grandmother    Cancer Maternal Grandfather    Hyperlipidemia Maternal Grandfather    Obesity Maternal Grandfather  Vision loss Maternal Grandfather    Cancer Paternal Grandmother    Social History   Tobacco Use   Smoking status: Never   Smokeless tobacco: Never  Vaping Use   Vaping Use: Never used  Substance Use Topics   Alcohol use: No   Drug use: No   Allergies:  Allergies  Allergen Reactions   Banana Swelling   Latex Swelling   Contrast Media [Iodinated Diagnostic Agents] Nausea And Vomiting   Gardasil [Hpv 4-Valent Vaccine Recombinant Vaccine]    Hpv Bival (Type 16,18) Recomb Vaccine  [Human Papillomavirus (16,18) Recomb Vac] Hives   Peanut Oil Swelling    Tongue swells/itching   Pumpkin Flavor Swelling   Lactose Intolerance (Gi)    Medications Prior to Admission  Medication Sig Dispense Refill Last Dose   cyclobenzaprine (FLEXERIL) 10 MG tablet Take 1 tablet (10 mg total) by mouth 2 (two) times daily as needed for  muscle spasms. (Patient not taking: Reported on 07/08/2021) 20 tablet 0    ibuprofen (ADVIL) 600 MG tablet Take 1 tablet (600 mg total) by mouth every 6 (six) hours as needed. (Patient not taking: Reported on 07/08/2021) 60 tablet 3    labetalol (NORMODYNE) 100 MG tablet Take 100 mg by mouth 2 (two) times daily.      metroNIDAZOLE (FLAGYL) 500 MG tablet Take 500 mg by mouth 3 (three) times daily.      norethindrone (MICRONOR) 0.35 MG tablet Take 1 tablet by mouth daily.      Review of Systems  Gastrointestinal:  Positive for abdominal pain. Negative for nausea and vomiting.  Genitourinary:  Positive for vaginal bleeding. Negative for difficulty urinating, dysuria and vaginal discharge.  Neurological:  Negative for dizziness, light-headedness and headaches.  Physical Exam   Blood pressure (!) 144/92, pulse 96, resp. rate 15, last menstrual period 04/13/2021, SpO2 100 %. Vitals:   07/09/21 1950 07/09/21 2000 07/09/21 2010 07/09/21 2030  BP: 140/84 133/88 (!) 148/90 126/80  Pulse: (!) 104 95 96 91  Resp:      Temp:      TempSrc:      SpO2: 98% 100% 100%    Physical Exam Vitals reviewed. Exam conducted with a chaperone present.  Constitutional:      Appearance: Normal appearance. She is well-developed.  HENT:     Head: Normocephalic and atraumatic.  Eyes:     Conjunctiva/sclera: Conjunctivae normal.  Cardiovascular:     Rate and Rhythm: Regular rhythm. Tachycardia present.     Heart sounds: Normal heart sounds.  Pulmonary:     Effort: Pulmonary effort is normal. No respiratory distress.  Abdominal:     Palpations: Abdomen is soft.     Tenderness: There is generalized abdominal tenderness.  Skin:    General: Skin is warm and dry.  Neurological:     Mental Status: She is alert and oriented to person, place, and time.  Psychiatric:        Mood and Affect: Mood is anxious.        Behavior: Behavior normal.   MAU Course  Procedures Results for orders placed or performed during  the hospital encounter of 07/09/21 (from the past 24 hour(s))  hCG, quantitative, pregnancy     Status: Abnormal   Collection Time: 07/09/21  6:04 PM  Result Value Ref Range   hCG, Beta Chain, Quant, S 51 (H) <5 mIU/mL  CBC with Differential/Platelet     Status: Abnormal   Collection Time: 07/09/21  6:04 PM  Result Value  Ref Range   WBC 11.5 (H) 4.0 - 10.5 K/uL   RBC 4.58 3.87 - 5.11 MIL/uL   Hemoglobin 13.7 12.0 - 15.0 g/dL   HCT 16.1 09.6 - 04.5 %   MCV 85.4 80.0 - 100.0 fL   MCH 29.9 26.0 - 34.0 pg   MCHC 35.0 30.0 - 36.0 g/dL   RDW 40.9 81.1 - 91.4 %   Platelets 399 150 - 400 K/uL   nRBC 0.0 0.0 - 0.2 %   Neutrophils Relative % 61 %   Neutro Abs 7.0 1.7 - 7.7 K/uL   Lymphocytes Relative 33 %   Lymphs Abs 3.7 0.7 - 4.0 K/uL   Monocytes Relative 5 %   Monocytes Absolute 0.5 0.1 - 1.0 K/uL   Eosinophils Relative 1 %   Eosinophils Absolute 0.1 0.0 - 0.5 K/uL   Basophils Relative 0 %   Basophils Absolute 0.0 0.0 - 0.1 K/uL   Immature Granulocytes 0 %   Abs Immature Granulocytes 0.05 0.00 - 0.07 K/uL  Type and screen     Status: None   Collection Time: 07/09/21  6:04 PM  Result Value Ref Range   ABO/RH(D) O POS    Antibody Screen NEG    Sample Expiration      07/12/2021,2359 Performed at [facility], [address] [place], [place] [address]    MDM Start IV LR Bolus Labs: CBC, CMP, hCG, T&S Pain Medication Ultrasound Assessment and Plan  30 year old [id number]  Known Ectopic Near Syncope  -Start IV with LR Bolus. -Collect Labs -Morphine ordered for pain. -Will send for [place] and await results.   Cherre Robins 07/09/2021, 6:38 PM   Reassessment (7:48 PM) Dr. Macon Large present and reviews [place] and labs. Advises: *Continue current outpatient management.  Reassessment (8:33 PM) -Provider to bedside.  -Informed patient of findings and provider recommendation. -Patient states she feels that pain is at it's worse. -Informed patient that she has not taken any  pain medication as she refused morphine. -Discussed possible causes for pain including appendicitis as patient feels that something is wrong. -Informed that provider will consult with MD regarding next steps including possibility of additional scans and pain management. -Dr. Birder Robson informed of patient status, interventions, and results. To assume care.  Cherre Robins MSN, CNM Advanced Practice Provider, [facility]  Addendum: Evaluated patient. Tender to palpation in RLQ without guarding or rebound. Long discussion with patient regarding pain. Discussed that no life threatening cause has been found. White count mildly elevated but overall unremarkable and hgb is stable. Images reviewed personally and on my read ectopic appears similar in size and free fluid is significantly reduced. Patient worried about appendicitis but no signs of peritonitis and patient without n/v and taking PO fine. Discussed that I do not think there is a test that will give definitive clarity and I do not advise additional tests or imaging. Most likely pain is caused by resolving ectopic. Suggested we focus on pain control as she is very worried about going home and being in pain. Reports toradol has worked in the past, will trial IV and if effective will send home with PO prescription.   Signout given to Amelianna Meller CNM at change of shift.   Venora Maples, MD/MPH Attending Family Medicine Physician, [facility] for [facility], [facility] 10:26 PM  Assumed care of patient at 2230. Patient had been given dose of toradol and was feeling decreased pain  at the time of my reassessment (still having trouble lifting/lowering leg but not as uncomfortable). Still feeling very concerned that she will be in severe pain again and not convinced that it is only due to the ectopic. Validated frustrations and concerns, reiterating again that this is likely due to the normal  progression of methotrexate treatment. Explained at length how it works and how it can cause a transient inflammatory process as the pregnancy is reabsorbed by the body. Pt understanding but still very anxious and concerned about the pain, asking whether she should have just had the tube removed. Asked to speak to surgeon before discharge.   Spoke with Dr. Macon Large who recommended getting a CT to completely rule out appendicitis considering pt's continuing pain. Will speak to patient after CT results.  CT ordered and consented - showed no evidence of abnormality except the adnexal mass as previously seen on U/S. Reviewed report with Dr. Macon Large who did not recommend additional treatment for ectopic.   Discussed findings and MD recommendations with patient and used shared decision making to come up with a plan for discharge. Pt greatly dislikes feeling "high" on opiates and stated she "would rather suffer than feel that loopy." Offered another dose of toradol, valium for anxiety (which she has taken previously and knows it works well) and zofran for the car ride home. Given a short course of toradol and valium for home management with return precautions.   Discharge home in stable condition with return precautions. Follow up at [facility] as scheduled.  Edd Arbour, CNM, MSN, IBCLC Certified Nurse [name], [facility]

## 2021-07-09 NOTE — MAU Note (Signed)
.  Susan Krause is a 29 y.o. at [redacted]w[redacted]d here in MAU reporting: lower abdominal pain that started at 1700. She states the pain is 8/10. Having some VB.   Vitals:   07/09/21 1810  BP: (!) 158/100  Pulse: (!) 125  Resp: 15  SpO2: 100%

## 2021-07-10 ENCOUNTER — Inpatient Hospital Stay (HOSPITAL_COMMUNITY): Payer: Medicaid Other

## 2021-07-10 MED ORDER — KETOROLAC TROMETHAMINE 10 MG PO TABS: 10.0000 mg | ORAL_TABLET | Freq: Four times a day (QID) | ORAL | 0 refills | Status: DC | PRN

## 2021-07-10 MED ORDER — DIAZEPAM 2 MG PO TABS: 2.0000 mg | ORAL_TABLET | Freq: Four times a day (QID) | ORAL | 0 refills | Status: DC | PRN

## 2021-07-10 MED ORDER — ONDANSETRON HCL 4 MG/2ML IJ SOLN
4.0000 mg | Freq: Once | INTRAMUSCULAR | Status: AC
  Administered 2021-07-10: 4 mg via INTRAVENOUS
  Filled 2021-07-10: qty 2

## 2021-07-10 MED ORDER — ACETAMINOPHEN 10 MG/ML IV SOLN: 1000.0000 mg | Freq: Four times a day (QID) | INTRAVENOUS | Status: DC

## 2021-07-10 MED ORDER — DIAZEPAM 5 MG/ML IJ SOLN
2.5000 mg | Freq: Once | INTRAMUSCULAR | Status: AC
  Administered 2021-07-10: 2.5 mg via INTRAVENOUS
  Filled 2021-07-10: qty 2

## 2021-07-10 MED ORDER — KETOROLAC TROMETHAMINE 30 MG/ML IJ SOLN
30.0000 mg | Freq: Once | INTRAMUSCULAR | Status: AC
  Administered 2021-07-10: 30 mg via INTRAVENOUS
  Filled 2021-07-10: qty 1

## 2021-07-13 ENCOUNTER — Other Ambulatory Visit: Payer: Medicaid Other

## 2021-07-16 ENCOUNTER — Other Ambulatory Visit: Payer: Self-pay

## 2021-07-16 ENCOUNTER — Ambulatory Visit (INDEPENDENT_AMBULATORY_CARE_PROVIDER_SITE_OTHER): Payer: Medicaid Other | Admitting: Obstetrics and Gynecology

## 2021-07-16 ENCOUNTER — Encounter: Payer: Self-pay | Admitting: Obstetrics and Gynecology

## 2021-07-16 VITALS — BP 107/80 | HR 96 | Resp 16 | Ht 61.0 in | Wt 174.0 lb

## 2021-07-16 DIAGNOSIS — O00109 Unspecified tubal pregnancy without intrauterine pregnancy: Secondary | ICD-10-CM

## 2021-07-16 LAB — HCG, QUANTITATIVE, PREGNANCY: HCG, Total, QN: 18 m[IU]/mL

## 2021-07-16 MED ORDER — NORETHIN ACE-ETH ESTRAD-FE 1-20 MG-MCG PO TABS
1.0000 | ORAL_TABLET | Freq: Every day | ORAL | 11 refills | Status: DC
Start: 2021-07-16 — End: 2021-11-16

## 2021-07-16 NOTE — Progress Notes (Signed)
GYNECOLOGY OFFICE VISIT NOTE  History:   Susan Krause is a 30 y.o. [phone] here today for  follow up from her ectopic pregnancy. She had a D&E in s/o abnormal, nonviable betas and showed no POC confirming no IUP and c/w ectopic pregnancy. She was given MTX that day of surgery (10/19).   She has been seen in the MAU several times (1021, 10/24, 10/27) for abdominal pain and her imaging has remained consistent with findings of likely right ectopic (she had left tube removed so this is expected). She had a CT on 10/27 to rule out appendicitis as this was another concern of hers).   She is on POP and she is spotting daily with red blood.      Past Medical History:  Diagnosis Date   ADHD (attention deficit hyperactivity disorder)    No meds   Cervical dysplasia    Chlamydia    Dyspnea    06/30/2020- climbing stairs- while I'm pregnant   Head injury with loss of consciousness (HCC) 03/2020   Migraine    Propanolol 10 mg   OCD (obsessive compulsive disorder)    PTSD (post-traumatic stress disorder)    no meds   PVC (premature ventricular contraction)    PVC's (premature ventricular contractions) 2018    Past Surgical History:  Procedure Laterality Date   CESAREAN SECTION N/A 07/18/2018   Procedure: CESAREAN SECTION;  Surgeon: Essie Hart, MD;  Location: [facility] BIRTHING SUITES;  Service: Obstetrics;  Laterality: N/A;   DIAGNOSTIC LAPAROSCOPY WITH REMOVAL OF ECTOPIC PREGNANCY Left 12/20/2020   Procedure: LAPAROSCOPIC LEFT SALPINGECTOMY WITH REMOVAL OF ECTOPIC PREGNANCY;  Surgeon: [name], MD;  Location: [facility] OR;  Service: Gynecology;  Laterality: Left;   DILATION AND CURETTAGE OF UTERUS N/A 07/01/2021   Procedure: DILATATION AND CURETTAGE WITH PATHOLOGY;  Surgeon: Milas Hock, MD;  Location: [facility] OR;  Service: Gynecology;  Laterality: N/A;   SALPINGECTOMY Left 12/2020   WISDOM TOOTH EXTRACTION      The following portions of the patient's history were reviewed and updated as  appropriate: allergies, current medications, past family history, past medical history, past social history, past surgical history and problem list.   Health Maintenance:   Diagnosis  Date Value Ref Range Status  01/22/2021      - Negative for intraepithelial lesion or malignancy (NILM)     Review of Systems:  Pertinent items noted in HPI and remainder of comprehensive ROS otherwise negative.  Physical Exam:  BP 107/80   Pulse 96   Resp 16   Ht 5\' 1"  (1.549 m)   Wt 174 lb (78.9 kg)   LMP 04/13/2021   BMI 32.88 kg/m  CONSTITUTIONAL: Well-developed, well-nourished female in no acute distress.  HEENT:  Normocephalic, atraumatic. External right and left ear normal. No scleral icterus.  NECK: Normal range of motion, supple, no masses noted on observation SKIN: No rash noted. Not diaphoretic. No erythema. No pallor. MUSCULOSKELETAL: Normal range of motion. No edema noted. NEUROLOGIC: Alert and oriented to person, place, and time. Normal muscle tone coordination. No cranial nerve deficit noted. PSYCHIATRIC: Normal mood and affect. Normal behavior. Normal judgment and thought content.  CARDIOVASCULAR: Normal heart rate noted RESPIRATORY: Effort and breath sounds normal, no problems with respiration noted ABDOMEN: No masses noted. No other overt distention noted.    PELVIC: Deferred  Labs and Imaging Betas: 10/19: 104 (day1) 10/21: 110 (day2) 10/24: 70 (day5) 10/26: 56 (day7) 10/27: 51 (day8)    CT ABDOMEN PELVIS WO CONTRAST  Result Date: 07/10/2021 CLINICAL DATA:  Right lower quadrant pain. EXAM: CT ABDOMEN AND PELVIS WITHOUT CONTRAST TECHNIQUE: Multidetector CT imaging of the abdomen and pelvis was performed following the standard protocol without IV contrast. COMPARISON:  None. FINDINGS: Lower chest: No acute abnormality. Hepatobiliary: No focal liver abnormality is seen. No gallstones, gallbladder wall thickening, or biliary dilatation. Pancreas: Unremarkable. No pancreatic  ductal dilatation or surrounding inflammatory changes. Spleen: Normal in size without focal abnormality. Adrenals/Urinary Tract: Adrenal glands are unremarkable. Kidneys are normal, without renal calculi, focal lesion, or hydronephrosis. Bladder is unremarkable. Stomach/Bowel: Stomach is within normal limits. Appendix appears normal. No evidence of bowel wall thickening, distention, or inflammatory changes. Vascular/Lymphatic: No significant vascular findings are present. No enlarged abdominal or pelvic lymph nodes. Reproductive: The uterus is unremarkable. A 4.4 cm x 3.3 cm area of heterogeneous attenuation is seen along the right adnexa (axial CT images 63 through 71, CT series 3). Other: No abdominal wall hernia or abnormality. No abdominopelvic ascites. Musculoskeletal: No acute or significant osseous findings. IMPRESSION: Area of heterogeneous attenuation along the right adnexa which may represent a hemorrhagic ovarian cyst. Correlation with pelvic ultrasound is recommended. Electronically Signed   By: Virgina Norfolk M.D.   On: 07/10/2021 00:29   US OB Transvaginal  Result Date: 07/09/2021 CLINICAL DATA:  Pain with known right ectopic EXAM: TRANSVAGINAL OB ULTRASOUND TECHNIQUE: Transvaginal ultrasound was performed for complete evaluation of the gestation as well as the maternal uterus, adnexal regions, and pelvic cul-de-sac. COMPARISON:  Ultrasound 07/06/2021, 07/03/2021, 06/29/2021, 06/23/2021 FINDINGS: Intrauterine gestational sac: Not visualized Yolk sac:  Not visualized Embryo:  Not visualized Maternal uterus/adnexae: Left ovary is normal and measures 1.4 x 2.6 x 1 cm. Right ovary measures 3.2 x 2.4 x 2.3 cm. Slightly echogenic mass adjacent to the right ovary. Right adnexal mass measures 3.3 x 1.9 x 1.9 cm. Previous measurements: 07/06/2021: 0.8 x 1.3 cm 07/03/2021: 1.4 x 2 x 2.2 cm Other: Small free fluid in the pelvis, decreased compared to prior ultrasound. IMPRESSION: 1. Persistent abnormal  soft tissue mass adjacent to the right ovary, measuring up to 3.3 cm in size, overall increased compared to prior measurements of 2.2 cm maximum on 07/03/2021. 2. Small free fluid in the pelvis which is decreased compared with ultrasound on 07/06/2021. Electronically Signed   By: Donavan Foil M.D.   On: 07/09/2021 19:12    US OB LESS THAN 14 WEEKS WITH OB TRANSVAGINAL  Result Date: 07/06/2021 CLINICAL DATA:  Abdominal pain EXAM: OBSTETRIC <14 WK [place] AND TRANSVAGINAL OB US TECHNIQUE: Transvaginal ultrasound was performed for complete evaluation of the gestation as well as the maternal uterus, adnexal regions, and pelvic cul-de-sac. COMPARISON:  Obstetric ultrasound 07/03/2021 FINDINGS: Intrauterine gestational sac: None Yolk sac:  Not Visualized. Embryo:  Not Visualized. Cardiac Activity: Not Visualized. Maternal uterus/adnexae: Moderate complex free fluid is again seen in the pelvis. There is a 0.8 cm x 1.3 cm structure in the right adnexa which was also seen on the prior study. The right ovary itself is normal in appearance measuring 4.4 cm x 1.9 cm x 2.7 cm. IMPRESSION: Abnormal structure again seen in the right adnexa suspicious for an ectopic pregnancy. There is new moderate volume complex fluid in the pelvis concerning for interval hemorrhage. OB consultation is recommended. These results were called by telephone at the time of interpretation on 07/06/2021 at 3:43 pm to provider Loma Boston , who verbally acknowledged these results. Electronically Signed   By: Valetta Mole M.D.   On:  07/06/2021 15:44    US OB Transvaginal  Result Date: 07/03/2021 CLINICAL DATA:  Known ectopic pregnancy status post methotrexate. EXAM: TRANSVAGINAL OB ULTRASOUND TECHNIQUE: Transvaginal ultrasound was performed for complete evaluation of the gestation as well as the maternal uterus, adnexal regions, and pelvic cul-de-sac. COMPARISON:  06/29/2021, 06/23/2021, 05/19/2021 FINDINGS: Intrauterine gestational sac: None  visualized Yolk sac:  Not visualized Embryo:  Not visualized Maternal uterus/adnexae: Moderate mobile probable hemorrhagic fluid in the endometrial canal. Right ovary measures 1.9 by 3.7 x 1.9 cm. Right adnexal mass adjacent to the ovary measuring 1.4 by 2 x 2.2 cm. Left ovary measures 2.2 x 1.1 x 1.9 cm. IMPRESSION: 1. No IUP identified. Moderate probable hemorrhagic fluid in the endometrial canal. 2.2 cm right adnexal mass adjacent to the ovary presumably corresponding to history of ectopic pregnancy, this was not demonstrated on the previous ultrasound images. 2. No significant free fluid in the pelvis Electronically Signed   By: Donavan Foil M.D.   On: 07/03/2021 23:55  Assessment and Plan:    1. Tubal pregnancy without intrauterine pregnancy, unspecified laterality - Her pain is gone and she is feeling better about her recovery.  - Beta to be done today.  Betas have not been done at normal timing due to ER visits, but overall trending downward which is appropriate. Would recommend continuing to trend them. If plateaus, would repeat MTX. She asked about tube removal but we discussed then she would not be able to conceive which she does desires. MTX is our way to preserve her fertility.  - As for BTB on POP, this is normal. One option is to do condoms or another hormonal form of birth control. She would like to try a low dose of OCPs. We discussed Junel 1/20. If she has mood issues but otherwise bleeding improved, we will do lo lo estrin. If bleeding not improved but mood okay, then we can do Junel 1.5/30. Instructed her to give it at least one pill pack.    Routine preventative health maintenance measures emphasized. Please refer to After Visit Summary for other counseling recommendations.   Return if symptoms worsen or fail to improve.     Radene Gunning, MD, [place] for [facility], [place]

## 2021-07-20 ENCOUNTER — Other Ambulatory Visit: Payer: Self-pay

## 2021-07-20 DIAGNOSIS — B379 Candidiasis, unspecified: Secondary | ICD-10-CM

## 2021-07-20 MED ORDER — FLUCONAZOLE 150 MG PO TABS: 150.0000 mg | ORAL_TABLET | Freq: Once | ORAL | 0 refills | Status: AC

## 2021-07-20 NOTE — Progress Notes (Signed)
Two Diflucan sent per Dr Para March

## 2021-07-23 ENCOUNTER — Other Ambulatory Visit: Payer: Self-pay

## 2021-07-23 ENCOUNTER — Other Ambulatory Visit (INDEPENDENT_AMBULATORY_CARE_PROVIDER_SITE_OTHER): Payer: Medicaid Other

## 2021-07-23 DIAGNOSIS — O00109 Unspecified tubal pregnancy without intrauterine pregnancy: Secondary | ICD-10-CM

## 2021-07-23 NOTE — Progress Notes (Signed)
Pt here for BHCG blood draw. Pt given lab orders and sent to lab.

## 2021-07-24 LAB — HCG, QUANTITATIVE, PREGNANCY: HCG, Total, QN: 3 m[IU]/mL

## 2021-08-31 ENCOUNTER — Other Ambulatory Visit (HOSPITAL_COMMUNITY)
Admission: RE | Admit: 2021-08-31 | Discharge: 2021-08-31 | Disposition: A | Discharge status: Home / Self Care | Payer: Medicaid Other | Source: Ambulatory Visit | Attending: Obstetrics & Gynecology | Admitting: Obstetrics & Gynecology

## 2021-08-31 ENCOUNTER — Encounter: Payer: Self-pay | Admitting: Obstetrics & Gynecology

## 2021-08-31 ENCOUNTER — Other Ambulatory Visit: Payer: Self-pay

## 2021-08-31 ENCOUNTER — Ambulatory Visit: Payer: Medicaid Other | Admitting: Obstetrics & Gynecology

## 2021-08-31 VITALS — BP 151/102 | HR 76 | Ht 61.0 in | Wt 177.0 lb

## 2021-08-31 DIAGNOSIS — M940 Chondrocostal junction syndrome [Tietze]: Secondary | ICD-10-CM

## 2021-08-31 DIAGNOSIS — I10 Essential (primary) hypertension: Secondary | ICD-10-CM | POA: Diagnosis not present

## 2021-08-31 DIAGNOSIS — N898 Other specified noninflammatory disorders of vagina: Secondary | ICD-10-CM

## 2021-08-31 MED ORDER — MELOXICAM 7.5 MG PO TABS: 7.5000 mg | ORAL_TABLET | Freq: Every day | ORAL | 0 refills | Status: DC

## 2021-08-31 NOTE — Progress Notes (Signed)
° °  Subjective:    Patient ID: Susan Krause, female    DOB: Jul 25, 1991, 30 y.o.   MRN: [id number]  HPI  30 yo female c/o thin flakey discharge after being treated with metrogel--had wet prep at PCP for few clue cells.  Pt also c/o continued costochondritis.  Patient has upcoming appointment with primary care.  Patient would like a little stronger NSAID other than ibuprofen.  Review of Systems  Constitutional: Negative.   Respiratory: Negative.    Cardiovascular: Negative.   Gastrointestinal: Negative.   Genitourinary:  Positive for vaginal discharge. Negative for dysuria and pelvic pain.  Musculoskeletal:        Pain at sternum, reproducible  Psychiatric/Behavioral: Negative.        Objective:   Physical Exam Vitals reviewed.  Constitutional:      General: She is not in acute distress.    Appearance: She is well-developed.  HENT:     Head: Normocephalic and atraumatic.  Eyes:     Conjunctiva/sclera: Conjunctivae normal.  Cardiovascular:     Rate and Rhythm: Normal rate.  Pulmonary:     Effort: Pulmonary effort is normal.  Abdominal:     General: Abdomen is flat.     Palpations: Abdomen is soft.     Tenderness: There is no abdominal tenderness.  Genitourinary:    Comments: Tanner V Vulva:  No lesion Vagina:  Pink, no lesions, small amount of discharge; + blood (started menses today). Cervix:  No CMT  Skin:    General: Skin is warm and dry.  Neurological:     Mental Status: She is alert and oriented to person, place, and time.  Psychiatric:        Mood and Affect: Mood normal.   Assessment & Plan:  30 year old female who was treated with bacterial vaginosis with MetroGel.  Patient complaining of discharge.  She is on her menstrual cycle which is limiting visualization. Wet prep sent today Meloxicam sent for costochondritis.  Patient to follow-up with primary care. Patient is hypertensive today and should follow-up with her primary care.  She is on labetalol for  migraine prophylaxis.  32 minutes was spent with patient encounter.  This includes review of records, history and physical, counseling, and documentation.

## 2021-08-31 NOTE — Progress Notes (Signed)
Pt c/o pain with intercourse Pt states vaginal discharge does not itch or have odor Pt c/o chest pain- was seen in ED and is following up with PCP tomorrow

## 2021-09-02 LAB — CERVICOVAGINAL ANCILLARY ONLY
Bacterial Vaginitis (gardnerella): NEGATIVE
Candida Glabrata: NEGATIVE
Candida Vaginitis: NEGATIVE
Chlamydia: NEGATIVE
Comment: NEGATIVE
Comment: NEGATIVE
Comment: NEGATIVE
Comment: NEGATIVE
Comment: NEGATIVE
Comment: NORMAL
Neisseria Gonorrhea: NEGATIVE
Trichomonas: NEGATIVE

## 2021-09-29 ENCOUNTER — Encounter: Payer: Self-pay | Admitting: Obstetrics and Gynecology

## 2021-10-06 ENCOUNTER — Other Ambulatory Visit (HOSPITAL_COMMUNITY)
Admission: RE | Admit: 2021-10-06 | Discharge: 2021-10-06 | Disposition: A | Discharge status: Home / Self Care | Payer: Medicaid Other | Source: Ambulatory Visit | Attending: Advanced Practice Midwife | Admitting: Advanced Practice Midwife

## 2021-10-06 ENCOUNTER — Ambulatory Visit: Payer: Medicaid Other | Admitting: Advanced Practice Midwife

## 2021-10-06 ENCOUNTER — Encounter: Payer: Self-pay | Admitting: Advanced Practice Midwife

## 2021-10-06 ENCOUNTER — Other Ambulatory Visit: Payer: Self-pay

## 2021-10-06 VITALS — BP 127/91 | HR 83 | Ht 61.0 in | Wt 181.0 lb

## 2021-10-06 DIAGNOSIS — Z3202 Encounter for pregnancy test, result negative: Secondary | ICD-10-CM

## 2021-10-06 DIAGNOSIS — B9689 Other specified bacterial agents as the cause of diseases classified elsewhere: Secondary | ICD-10-CM

## 2021-10-06 DIAGNOSIS — N941 Unspecified dyspareunia: Secondary | ICD-10-CM | POA: Diagnosis present

## 2021-10-06 DIAGNOSIS — N898 Other specified noninflammatory disorders of vagina: Secondary | ICD-10-CM

## 2021-10-06 DIAGNOSIS — N760 Acute vaginitis: Secondary | ICD-10-CM

## 2021-10-06 DIAGNOSIS — B3731 Acute candidiasis of vulva and vagina: Secondary | ICD-10-CM

## 2021-10-06 LAB — POCT URINE PREGNANCY: Preg Test, Ur: NEGATIVE

## 2021-10-06 LAB — HCG, QUANTITATIVE, PREGNANCY: HCG, Total, QN: <3 m[IU]/mL

## 2021-10-06 MED ORDER — NYSTATIN-TRIAMCINOLONE 100000-0.1 UNIT/GM-% EX OINT: 1.0000 "application " | TOPICAL_OINTMENT | Freq: Two times a day (BID) | CUTANEOUS | 0 refills | Status: DC

## 2021-10-06 NOTE — Progress Notes (Signed)
° °  GYNECOLOGY PROGRESS NOTE  History:  31 y.o. [id number] presents to [facility] office today for problem gyn visit. She reports vaginal itching and irritation last week. She had testing at her PCP which found scant/rare yeast and took Diflucan, then repeated Diflucan in 3 days. 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 She started a probiotic on 10/01/21.  She reports that intercourse was painful with burning during and afterwards on 10/03/21.  Intercourse was still painful but less painful on 10/05/21.  She wanted to follow up with [place] due to her hx of recurrent infection.  Also, she reports breast tenderness, her usual first sign of pregnancy, and she worries because she had an ectopic pregnancy in the past.  She denies any pain or vaginal bleeding.  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 She has no hx of oral or genital lesions.    She denies h/a, dizziness, shortness of breath, n/v, or fever/chills.    The following portions of the patient's history were reviewed and updated as appropriate: allergies, current medications, past family history, past medical history, past social history, past surgical history and problem list. Last pap smear on 01/22/21 was normal.  Health Maintenance Due  Topic Date Due   COVID-19 Vaccine (1) Never done   Hepatitis C Screening  Never done   INFLUENZA VACCINE  04/13/2021     Review of Systems:  Pertinent items are noted in HPI.   Objective:  Physical Exam Blood pressure (!) 127/91, pulse 83, height 5\' 1"  (1.549 m), weight 181 lb (82.1 kg), last menstrual period 09/14/2021, unknown if currently breastfeeding. VS reviewed, nursing note reviewed,  Constitutional: well developed, well nourished, no distress HEENT: normocephalic CV: normal rate Pulm/chest wall: normal effort Breast Exam: deferred Abdomen: soft Neuro: alert and oriented x 3 Skin: warm, dry Psych: affect  normal Pelvic exam: On visual inspection, generalized erythema of perineum, labia and vagina with no distinct lesions.  Tenderness to palpation of perineum.  No discharge noted.    Assessment & Plan:  1. Dyspareunia, female - Cervicovaginal ancillary only( [place])  2. Vaginal odor - Cervicovaginal ancillary only( [place])  3. 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 Likely pt resolved candida with Diflucan x 2 tabs taken 3 days apart but skin irritation remains.    - Herpes simplex virus culture - nystatin-triamcinolone ointment (MYCOLOG); Apply 1 application topically 2 (two) times daily.  Dispense: 30 g; Refill: 0  4. Urine pregnancy test negative --Pt with breast tenderness, menses expected in 6 days.  She has hx ectopic pregnancy so wants to know as soon as possible. - Beta hCG quant (ref lab)  5. 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   --Taking probiotic daily, started 1/19. Continue for at least 2 months for improvement in vaginal symptoms.   - nystatin-triamcinolone ointment (MYCOLOG); Apply 1 application topically 2 (two) times daily.  Dispense: 30 g; Refill: 0    Fatima Blank, CNM 8:59 AM

## 2021-10-06 NOTE — Progress Notes (Signed)
Pt denies dysuria/frequent urination Pt c/o vaginal burning during intercourse and vaginal odor Pt request STI testing

## 2021-10-07 LAB — CERVICOVAGINAL ANCILLARY ONLY
Bacterial Vaginitis (gardnerella): POSITIVE — AB
Candida Glabrata: NEGATIVE
Candida Vaginitis: NEGATIVE
Chlamydia: NEGATIVE
Comment: NEGATIVE
Comment: NEGATIVE
Comment: NEGATIVE
Comment: NEGATIVE
Comment: NEGATIVE
Comment: NORMAL
Neisseria Gonorrhea: NEGATIVE
Trichomonas: NEGATIVE

## 2021-10-08 LAB — HERPES SIMPLEX VIRUS CULTURE
MICRO NUMBER:: 12911727
SPECIMEN QUALITY:: ADEQUATE

## 2021-10-09 MED ORDER — METRONIDAZOLE 0.75 % VA GEL: 1.0000 | Freq: Every day | VAGINAL | 1 refills | Status: DC

## 2021-10-09 NOTE — Addendum Note (Signed)
Addended by: Fatima Blank A on: 10/09/2021 05:43 AM   Modules accepted: Orders

## 2021-11-06 ENCOUNTER — Telehealth: Payer: Self-pay | Admitting: *Deleted

## 2021-11-06 ENCOUNTER — Encounter: Payer: Self-pay | Admitting: Obstetrics and Gynecology

## 2021-11-06 NOTE — Telephone Encounter (Cosign Needed)
Pt called stating that she never took her Flagyl when she was given it before but now thinks she has vaginal odor and wants to know if she can use it now.  She also stated that she took a UPT which was faintly positive.  Her LMP 10/11/21.  Suggested that she come in Monday morning for HCG and return on Wed for repeat.  Explained again that an U/S cannot be done until her HCG levels are at least 1500.  Pt voices understanding.  Due to her H/O ectopic precautions given.

## 2021-11-06 NOTE — Telephone Encounter (Signed)
Entered in error

## 2021-11-08 ENCOUNTER — Other Ambulatory Visit: Payer: Self-pay

## 2021-11-08 ENCOUNTER — Inpatient Hospital Stay (HOSPITAL_COMMUNITY)
Admission: AD | Admit: 2021-11-08 | Discharge: 2021-11-08 | Disposition: A | Discharge status: Home / Self Care | Payer: Medicaid Other | Attending: Obstetrics & Gynecology | Admitting: Obstetrics & Gynecology

## 2021-11-08 ENCOUNTER — Encounter (HOSPITAL_COMMUNITY): Payer: Self-pay | Admitting: Obstetrics & Gynecology

## 2021-11-08 DIAGNOSIS — O26891 Other specified pregnancy related conditions, first trimester: Secondary | ICD-10-CM | POA: Insufficient documentation

## 2021-11-08 DIAGNOSIS — Z3201 Encounter for pregnancy test, result positive: Secondary | ICD-10-CM | POA: Diagnosis not present

## 2021-11-08 DIAGNOSIS — R109 Unspecified abdominal pain: Secondary | ICD-10-CM | POA: Diagnosis present

## 2021-11-08 DIAGNOSIS — M545 Low back pain, unspecified: Secondary | ICD-10-CM | POA: Insufficient documentation

## 2021-11-08 DIAGNOSIS — O0911 Supervision of pregnancy with history of ectopic or molar pregnancy, first trimester: Secondary | ICD-10-CM | POA: Insufficient documentation

## 2021-11-08 DIAGNOSIS — Z9079 Acquired absence of other genital organ(s): Secondary | ICD-10-CM | POA: Diagnosis not present

## 2021-11-08 LAB — HCG, QUANTITATIVE, PREGNANCY: hCG, Beta Chain, Quant, S: 20 m[IU]/mL — ABNORMAL HIGH (ref <5)

## 2021-11-08 LAB — POCT PREGNANCY, URINE: Preg Test, Ur: NEGATIVE

## 2021-11-08 NOTE — MAU Note (Addendum)
Pt discharged by Camelia Eng, CNM and was advised to return for a f/u hcg level.    Addison Naegeli, RN

## 2021-11-08 NOTE — MAU Note (Addendum)
Pt reports to MAU with c/o lower back pain and tail bone pain.  Pt reports that she has intermittent abdominal pain that is all over the belly.  Pt states that around 1600 she was lightheaded.  Pt denies vaginal discharge and vaginal bleeding.  Pt states that her LMP is 10/11/21.

## 2021-11-08 NOTE — MAU Provider Note (Signed)
None    S Ms. Jenilee Franey is a 31 y.o. [id number] patient who presents to MAU today with complaint of positive pregnancy test and lower abdominal and back pain. Patient reports 2 "faintly positive" pregnancy tests at home. History is significant for 2 ectopic pregnancies, including left salpingectomy in April 2022. Reports some mild lower abdominal, back, and tail bone pain for the past few days. Pain is intermittent. She denies vaginal bleeding, discharge, dizziness or urinary s/s. LMP 10/11/2021.  O BP 134/90 (BP Location: Right Arm)    Pulse 86    Resp 17    Wt 81.7 kg    LMP 10/11/2021    SpO2 100%    BMI 34.03 kg/m   Physical Exam Vitals and nursing note reviewed.  Constitutional:      General: She is not in acute distress. Eyes:     Extraocular Movements: Extraocular movements intact.     Pupils: Pupils are equal, round, and reactive to light.  Cardiovascular:     Rate and Rhythm: Normal rate.  Pulmonary:     Effort: Pulmonary effort is normal.  Abdominal:     Palpations: Abdomen is soft.     Tenderness: There is no abdominal tenderness. There is no guarding.  Musculoskeletal:     Cervical back: Normal range of motion.  Skin:    General: Skin is warm and dry.  Neurological:     General: No focal deficit present.     Mental Status: She is alert and oriented to person, place, and time.  Psychiatric:        Mood and Affect: Mood normal.        Behavior: Behavior normal.        Thought Content: Thought content normal.        Judgment: Judgment normal.    A Medical screening exam complete UPT negative bHCG 20  P D/w Dr. Macon Large. Follow up bHCG in 48 hours. Patient will go to [facility] on Wednesday 3/1 at 0800.  Discharge from MAU in stable condition Strict warning signs for worsening condition that would warrant emergency follow-up discussed Patient may return to MAU as needed    Brand Males, CNM 11/08/2021 8:23 PM

## 2021-11-09 ENCOUNTER — Other Ambulatory Visit: Payer: Medicaid Other

## 2021-11-11 ENCOUNTER — Other Ambulatory Visit: Payer: Self-pay

## 2021-11-11 ENCOUNTER — Other Ambulatory Visit: Payer: Self-pay | Admitting: Obstetrics and Gynecology

## 2021-11-11 ENCOUNTER — Other Ambulatory Visit (INDEPENDENT_AMBULATORY_CARE_PROVIDER_SITE_OTHER): Payer: Medicaid Other

## 2021-11-11 DIAGNOSIS — O3680X0 Pregnancy with inconclusive fetal viability, not applicable or unspecified: Secondary | ICD-10-CM

## 2021-11-11 LAB — HCG, QUANTITATIVE, PREGNANCY: HCG, Total, QN: 79 m[IU]/mL

## 2021-11-11 NOTE — Progress Notes (Signed)
Pt here for BHCG per Dr.Duncan. Pt was given lab order and sent to the lab. Pt has no complaints. Pt denies bleeding and/or pain.  ?

## 2021-11-13 ENCOUNTER — Encounter: Payer: Self-pay | Admitting: Obstetrics and Gynecology

## 2021-11-13 ENCOUNTER — Other Ambulatory Visit: Payer: Medicaid Other

## 2021-11-13 ENCOUNTER — Other Ambulatory Visit: Payer: Self-pay

## 2021-11-13 DIAGNOSIS — O3680X0 Pregnancy with inconclusive fetal viability, not applicable or unspecified: Secondary | ICD-10-CM

## 2021-11-13 LAB — HCG, QUANTITATIVE, PREGNANCY: HCG, Total, QN: 236 m[IU]/mL

## 2021-11-13 NOTE — Progress Notes (Cosign Needed)
Pt here for repeat BHCG.  She denies any bleeding or pain at the moment. ?

## 2021-11-14 ENCOUNTER — Other Ambulatory Visit: Payer: Self-pay | Admitting: Obstetrics and Gynecology

## 2021-11-14 DIAGNOSIS — Z8759 Personal history of other complications of pregnancy, childbirth and the puerperium: Secondary | ICD-10-CM

## 2021-11-16 ENCOUNTER — Other Ambulatory Visit: Payer: Self-pay

## 2021-11-16 ENCOUNTER — Other Ambulatory Visit: Payer: Self-pay | Admitting: *Deleted

## 2021-11-16 ENCOUNTER — Other Ambulatory Visit: Payer: Self-pay | Admitting: Obstetrics and Gynecology

## 2021-11-16 DIAGNOSIS — Z8759 Personal history of other complications of pregnancy, childbirth and the puerperium: Secondary | ICD-10-CM

## 2021-11-16 DIAGNOSIS — O3680X0 Pregnancy with inconclusive fetal viability, not applicable or unspecified: Secondary | ICD-10-CM

## 2021-11-16 LAB — HCG, QUANTITATIVE, PREGNANCY: HCG, Total, QN: 1103 m[IU]/mL

## 2021-11-16 NOTE — Progress Notes (Signed)
U/S placed per Dr Duncan ?

## 2021-11-19 ENCOUNTER — Ambulatory Visit
Admission: RE | Admit: 2021-11-19 | Discharge: 2021-11-19 | Disposition: A | Discharge status: Home / Self Care | Payer: Medicaid Other | Source: Ambulatory Visit | Attending: Obstetrics and Gynecology | Admitting: Obstetrics and Gynecology

## 2021-11-19 ENCOUNTER — Ambulatory Visit (INDEPENDENT_AMBULATORY_CARE_PROVIDER_SITE_OTHER): Payer: Medicaid Other

## 2021-11-19 ENCOUNTER — Other Ambulatory Visit: Payer: Self-pay

## 2021-11-19 VITALS — Wt 182.3 lb

## 2021-11-19 DIAGNOSIS — O3680X0 Pregnancy with inconclusive fetal viability, not applicable or unspecified: Secondary | ICD-10-CM

## 2021-11-19 DIAGNOSIS — Z8759 Personal history of other complications of pregnancy, childbirth and the puerperium: Secondary | ICD-10-CM | POA: Diagnosis present

## 2021-11-19 DIAGNOSIS — Z712 Person consulting for explanation of examination or test findings: Secondary | ICD-10-CM

## 2021-11-19 NOTE — Progress Notes (Signed)
U/S ordered for two weeks per Dr.Duncan ?

## 2021-11-19 NOTE — Progress Notes (Signed)
Here today for ultrasound results following [place] for viability. [place] reviewed with Kennon Rounds, MD who finds single intrauterine gestational sac and yolk sac. Gestational sac measuring [redacted]w[redacted]d. Small subchorionic hemorrhage seen on [place]. Kennon Rounds, MD recommends [place] in 2 weeks to assess viability.  ? ?Results and provider recommendation reviewed with patient and partner (at patient's request). Patient declines [place] at 14 days due to partner being out of town. Requests [place] be done 12/02/21 so that partner may be present. [place] scheduled for 12/02/21 at 9 AM; pt to arrive 15 minutes early. Explained pt will be given results prior to leaving or will be contacted by provider with results. Pt verbalizes understanding.  ? Apolonio Schneiders RN ?11/19/21 ?

## 2021-11-26 ENCOUNTER — Encounter: Payer: Self-pay | Admitting: Obstetrics and Gynecology

## 2021-11-30 ENCOUNTER — Telehealth: Payer: Self-pay | Admitting: *Deleted

## 2021-11-30 MED ORDER — PROMETHAZINE HCL 25 MG PO TABS: ORAL_TABLET | ORAL | 1 refills | Status: DC

## 2021-11-30 NOTE — Telephone Encounter (Signed)
Pt called requesting something for her pregnancy N and V.  Per protocol Phenergan 25 mg PO sent to [facility] Dr. ?

## 2021-12-02 ENCOUNTER — Encounter: Payer: Self-pay | Admitting: Obstetrics and Gynecology

## 2021-12-02 ENCOUNTER — Encounter: Payer: Self-pay | Admitting: Family Medicine

## 2021-12-02 ENCOUNTER — Other Ambulatory Visit: Payer: Self-pay

## 2021-12-02 ENCOUNTER — Other Ambulatory Visit: Payer: Self-pay | Admitting: Obstetrics and Gynecology

## 2021-12-02 ENCOUNTER — Ambulatory Visit: Admission: RE | Admit: 2021-12-02 | Payer: Medicaid Other | Source: Ambulatory Visit

## 2021-12-02 ENCOUNTER — Telehealth: Payer: Self-pay | Admitting: *Deleted

## 2021-12-02 ENCOUNTER — Ambulatory Visit (INDEPENDENT_AMBULATORY_CARE_PROVIDER_SITE_OTHER): Payer: Medicaid Other | Admitting: Family Medicine

## 2021-12-02 ENCOUNTER — Ambulatory Visit
Admission: RE | Admit: 2021-12-02 | Discharge: 2021-12-02 | Disposition: A | Discharge status: Home / Self Care | Payer: Medicaid Other | Source: Ambulatory Visit | Attending: Obstetrics and Gynecology | Admitting: Obstetrics and Gynecology

## 2021-12-02 VITALS — BP 139/88 | HR 90

## 2021-12-02 DIAGNOSIS — O3680X0 Pregnancy with inconclusive fetal viability, not applicable or unspecified: Secondary | ICD-10-CM | POA: Insufficient documentation

## 2021-12-02 DIAGNOSIS — O200 Threatened abortion: Secondary | ICD-10-CM | POA: Diagnosis not present

## 2021-12-02 DIAGNOSIS — O021 Missed abortion: Secondary | ICD-10-CM | POA: Insufficient documentation

## 2021-12-02 NOTE — Assessment & Plan Note (Addendum)
Reviewed results with patient and had frank discussion that this is very suspicious for non-viable pregnancy despite not yet meeting strict ultrasound criteria. Patient understandably extremely upset on multiple levels: due to extremely long wait because of difficulty obtaining accurate read from [place] for over 3 hours, for hx of recurrent ectopic/pregnancy loss, and for news that this might also be a non-viable pregnancy. Apologized and explained rationale for long wait so that we could accurately convey results of her [place] and associated prognosis. We discussed next steps of scheduling [place] in 1 week to see if there is progression. I also instructed clinical staff to ensure she has a provider appt already scheduled so that she does not have to wait an unreasonable amount of time to discuss the result. Patient in agreement with this plan.  ?

## 2021-12-02 NOTE — Progress Notes (Signed)
Taken to room for provider to review [place] results. ?Nancy Fetter ?

## 2021-12-02 NOTE — Progress Notes (Signed)
? ?GYNECOLOGY OFFICE VISIT NOTE ? ?History:  ? Susan Krause is a 31 y.o. [id number] here today for [place] results. ? ?Seen in MAU on 11/08/2021 with vaginal bleeding and hcg of 20 ?Subsequently has had many follow up [place] as well as an [place] on 11/19/2021 that showed an IUGS and a YS but no fetal pole ? ?[place] today shows fetal pole measuring 4 mm but no cardiac activity ?[place] report called to me directly after significant delay and multiple erroneous reports were entered previously ?Today reports no abdominal pain or vaginal bleeding ?Her and partner report they have had three prior pregnancies together, this is their fourth and the only one that is an IUP, and it is extremely desired ? ?Health Maintenance Due  ?Topic Date Due  ? COVID-19 Vaccine (1) Never done  ? Hepatitis C Screening  Never done  ? INFLUENZA VACCINE  04/13/2021  ? ? ?Past Medical History:  ?Diagnosis Date  ? ADHD (attention deficit hyperactivity disorder)   ? No meds  ? Cervical dysplasia   ? Chlamydia   ? Dyspnea   ? 06/30/2020- climbing stairs- while I'm pregnant  ? Head injury with loss of consciousness ([place]) 03/2020  ? Migraine   ? Propanolol 10 mg  ? OCD (obsessive compulsive disorder)   ? PTSD (post-traumatic stress disorder)   ? no meds  ? PVC (premature ventricular contraction)   ? PVC's (premature ventricular contractions) 2018  ? ? ?Past Surgical History:  ?Procedure Laterality Date  ? CESAREAN SECTION N/A 07/18/2018  ? Procedure: CESAREAN SECTION;  Surgeon: Sanjuana Kava, MD;  Location: [facility];  Service: Obstetrics;  Laterality: N/A;  ? DIAGNOSTIC LAPAROSCOPY WITH REMOVAL OF ECTOPIC PREGNANCY Left 12/20/2020  ? Procedure: LAPAROSCOPIC LEFT SALPINGECTOMY WITH REMOVAL OF ECTOPIC PREGNANCY;  Surgeon: Donnamae Jude, MD;  Location: [place];  Service: Gynecology;  Laterality: Left;  ? DILATION AND CURETTAGE OF UTERUS N/A 07/01/2021  ? Procedure: DILATATION AND CURETTAGE WITH PATHOLOGY;  Surgeon: Radene Gunning, MD;  Location: [facility];  Service:  Gynecology;  Laterality: N/A;  ? SALPINGECTOMY Left 12/2020  ? WISDOM TOOTH EXTRACTION    ? ? ?The following portions of the patient's history were reviewed and updated as appropriate: allergies, current medications, past family history, past medical history, past social history, past surgical history and problem list.  ? ?Health Maintenance:   ?Last pap: ?Lab Results  ?Component Value Date  ? DIAGPAP  01/22/2021  ?  - Negative for intraepithelial lesion or malignancy (NILM)  ? ? ?Last mammogram:  ?N/a  ? ? ?Review of Systems:  ?Pertinent items noted in HPI and remainder of comprehensive ROS otherwise negative. ? ?Physical Exam:  ?BP 139/88   Pulse 90   LMP 10/11/2021  ?CONSTITUTIONAL: Well-developed, well-nourished female in no acute distress.  ?HEENT:  Normocephalic, atraumatic. External right and left ear normal. No scleral icterus.  ?NECK: Normal range of motion, supple, no masses noted on observation ?SKIN: No rash noted. Not diaphoretic. No erythema. No pallor. ?MUSCULOSKELETAL: Normal range of motion. No edema noted. ?NEUROLOGIC: Alert and oriented to person, place, and time. Normal muscle tone coordination.  ?PSYCHIATRIC: Normal mood and affect. Normal behavior. Normal judgment and thought content. ?RESPIRATORY: Effort normal, no problems with respiration noted ? ? ?Labs and Imaging ?No results found for this or any previous visit (from the past 168 hour(s)). ?US OB Transvaginal ? ?Result Date: 12/02/2021 ?CLINICAL DATA:  First trimester pregnancy with inconclusive fetal viability. EXAM: TRANSVAGINAL OB ULTRASOUND TECHNIQUE: Transvaginal ultrasound  was performed for complete evaluation of the gestation as well as the maternal uterus, adnexal regions, and pelvic cul-de-sac. COMPARISON:  11/19/2021 FINDINGS: Intrauterine gestational sac: Single Yolk sac:  Visualized. Embryo:  Visualized. Cardiac Activity: Not Visualized. CRL:   4 mm   6 w 1 d                  [place] EDC: 07/27/2022 Subchorionic hemorrhage:  None  visualized. Maternal uterus/adnexae: Both ovaries are normal in appearance. No mass or abnormal free fluid identified. IMPRESSION: Single IUP shows less than expected progression since prior exam. This is suspicious but not yet definitive for failed pregnancy. Recommend further follow-up US in 1 week for definitive diagnosis. This recommendation follows SRU consensus guidelines: Diagnostic Criteria for Nonviable Pregnancy Early in the First Trimester. [employer] Med [id number]:[id number]. These results were called by telephone at the time of interpretation on 12/02/2021 at 12:00 pm to provider Dr. Dione Plover, who verbally acknowledged these results. Electronically Signed   By: Marlaine Hind M.D.   On: 12/02/2021 12:00  ? ?US OB LESS THAN 14 WEEKS WITH OB TRANSVAGINAL ? ?Addendum Date: 12/02/2021   ?ADDENDUM REPORT: 12/02/2021 11:54 ADDENDUM: This is an addendum for report of the exam performed on 12/01/2021. Although an embryo is now visualized on this study, no embryonic cardiac activity is seen on this exam, so this should not be considered a living IUP at this point. Although there has been some progression of the IUP since the earlier exam on 11/19/2021, it is less than expected for the time interval, which raises suspicion for a failed IUP. CORRECTED IMPRESSION: Single IUP shows less than expected progression since 11/20/2027, which raises suspicion but is not yet definitive for failed pregnancy. Recommend continued follow-up by ultrasound in 1 week. These results were called by telephone at the time of interpretation on 12/02/2021 at 11:47 am to provider Dr. Dione Plover, who verbally acknowledged these results. Electronically Signed   By: Marlaine Hind M.D.   On: 12/02/2021 11:54  ? ?Addendum Date: 12/02/2021   ?ADDENDUM REPORT: 12/02/2021 11:08 ADDENDUM: This is a complete report for a separate examination performed on 12/02/2021, as follow-up to the exam on 11/19/2021 for which the report is also shown below. CLINICAL DATA:   First trimester pregnancy with inconclusive fetal viability. EXAM: TRANSVAGINAL OB US TECHNIQUE: Transvaginal ultrasound examination was performed for complete evaluation of the gestation as well as the maternal uterus, adnexal regions, and pelvic cul-de-sac. COMPARISON:  11/19/2021 FINDINGS: Intrauterine gestational sac: Single Yolk sac:  Visualized. Embryo:  Visualized. CRL: 4 mm   6 w   1 d  [place] EDC: 07/27/2022 Subchorionic hemorrhage:  None visualized. Maternal uterus/adnexae: Normal appearance of both ovaries. No mass or abnormal free fluid identified. IMPRESSION: Single living IUP with estimated gestational age of [redacted] weeks 1 day, and [place] EDC of 07/27/2022. Electronically Signed   By: Marlaine Hind M.D.   On: 12/02/2021 11:08  ? ?Result Date: 12/02/2021 ?CLINICAL DATA:  Rule out ectopic pregnancy. EXAM: OBSTETRIC <14 WK [place] AND TRANSVAGINAL OB US TECHNIQUE: Both transabdominal and transvaginal ultrasound examinations were performed for complete evaluation of the gestation as well as the maternal uterus, adnexal regions, and pelvic cul-de-sac. Transvaginal technique was performed to assess early pregnancy. COMPARISON:  None. FINDINGS: Intrauterine gestational sac: Single Yolk sac:  Visualized. Embryo:  Not Visualized. Cardiac Activity: Not Visualized. MSD: 4.2 mm   5 w   1 d Subchorionic hemorrhage:  Small subchorionic hemorrhage Maternal uterus/adnexae: Uterus and  bilateral adnexa unremarkable. IMPRESSION: 1. Intrauterine gestational sac with a yolk sac with mean sac diameter of 4.2 mm, corresponding to gestational age of [redacted] weeks and 1 day. The fetal pole is not visualized, likely secondary to early gestation. 2.  Small subchorionic hemorrhage. 3.  Uterus and bilateral adnexa are unremarkable. Electronically Signed: By: Keane Police D.O. On: 11/19/2021 09:11      ?Assessment and Plan:  ? ?Problem List Items Addressed This Visit   ? ?  ? Other  ? Threatened miscarriage  ?  Reviewed results with patient and had frank  discussion that this is very suspicious for non-viable pregnancy despite not yet meeting strict ultrasound criteria. Patient understandably extremely upset on multiple levels: due to extremely long wait because of

## 2021-12-02 NOTE — Telephone Encounter (Signed)
Instructed by Dr. Dione Plover to scheduled follow up US one week and patient prefers Thursday / Friday early am and then schedule with provider immediately after to go over results. Scheduled [place] for 12/10/21 0800, no US done on Fridays. Asked registrar to schedule with provider after- scheduled for 12/10/21 0915 with Dr. Kennon Rounds . ?I called Susan Krause and again apologized for  her long wait here today. I explained I had scheduled her follow [place] for Thursday 3/30 0800 , no appointments for Friday and then provider afterwards at 0915. She asked if I can move to Wednesday, she did not want Thursday. I explained I will see if I can and call her back. ?Moved [place] to 12/09/21 at 0800. Moved follow up with provider to 12/09/21 0855 with Dr. Dione Plover.  ?I called Susan Krause and informed her I was able to move the appointments to Wednesday and I reveiwed them with her. ?Staci Acosta ?

## 2021-12-09 ENCOUNTER — Other Ambulatory Visit: Payer: Self-pay | Admitting: *Deleted

## 2021-12-09 ENCOUNTER — Other Ambulatory Visit: Payer: Self-pay

## 2021-12-09 ENCOUNTER — Encounter: Payer: Self-pay | Admitting: *Deleted

## 2021-12-09 ENCOUNTER — Ambulatory Visit
Admission: RE | Admit: 2021-12-09 | Discharge: 2021-12-09 | Disposition: A | Discharge status: Home / Self Care | Payer: Medicaid Other | Source: Ambulatory Visit | Attending: Family Medicine | Admitting: Family Medicine

## 2021-12-09 ENCOUNTER — Ambulatory Visit (INDEPENDENT_AMBULATORY_CARE_PROVIDER_SITE_OTHER): Payer: Medicaid Other | Admitting: Family Medicine

## 2021-12-09 ENCOUNTER — Other Ambulatory Visit: Payer: Medicaid Other

## 2021-12-09 DIAGNOSIS — O3680X0 Pregnancy with inconclusive fetal viability, not applicable or unspecified: Secondary | ICD-10-CM | POA: Insufficient documentation

## 2021-12-09 DIAGNOSIS — O200 Threatened abortion: Secondary | ICD-10-CM

## 2021-12-09 NOTE — Progress Notes (Signed)
? ?GYNECOLOGY OFFICE VISIT NOTE ? ?History:  ? Susan Krause is a 31 y.o. [id number] here today for [place] results. ? ?Has been followed since March 9 for viability scans, see below ?At last visit had unreasonably long wait time, scheduled for [place] this morning with visit to follow ? ?Health Maintenance Due  ?Topic Date Due  ? COVID-19 Vaccine (1) Never done  ? Hepatitis C Screening  Never done  ? INFLUENZA VACCINE  04/13/2021  ? ? ?Past Medical History:  ?Diagnosis Date  ? ADHD (attention deficit hyperactivity disorder)   ? No meds  ? Cervical dysplasia   ? Chlamydia   ? Dyspnea   ? 06/30/2020- climbing stairs- while I'm pregnant  ? Head injury with loss of consciousness (HCC) 03/2020  ? Migraine   ? Propanolol 10 mg  ? OCD (obsessive compulsive disorder)   ? PTSD (post-traumatic stress disorder)   ? no meds  ? PVC (premature ventricular contraction)   ? PVC's (premature ventricular contractions) 2018  ? ? ?Past Surgical History:  ?Procedure Laterality Date  ? CESAREAN SECTION N/A 07/18/2018  ? Procedure: CESAREAN SECTION;  Surgeon: Essie Hart, MD;  Location: [facility] BIRTHING SUITES;  Service: Obstetrics;  Laterality: N/A;  ? DIAGNOSTIC LAPAROSCOPY WITH REMOVAL OF ECTOPIC PREGNANCY Left 12/20/2020  ? Procedure: LAPAROSCOPIC LEFT SALPINGECTOMY WITH REMOVAL OF ECTOPIC PREGNANCY;  Surgeon: [name], MD;  Location: [facility] OR;  Service: Gynecology;  Laterality: Left;  ? DILATION AND CURETTAGE OF UTERUS N/A 07/01/2021  ? Procedure: DILATATION AND CURETTAGE WITH PATHOLOGY;  Surgeon: Milas Hock, MD;  Location: [facility] OR;  Service: Gynecology;  Laterality: N/A;  ? SALPINGECTOMY Left 12/2020  ? WISDOM TOOTH EXTRACTION    ? ? ?The following portions of the patient's history were reviewed and updated as appropriate: allergies, current medications, past family history, past medical history, past social history, past surgical history and problem list.  ? ?Health Maintenance:   ?Last pap: ?Lab Results  ?Component Value Date  ? DIAGPAP   01/22/2021  ?  - Negative for intraepithelial lesion or malignancy (NILM)  ? ? ?Last mammogram:  ?N/a  ? ? ?Review of Systems:  ?Pertinent items noted in HPI and remainder of comprehensive ROS otherwise negative. ? ?Physical Exam:  ?LMP 10/11/2021  ?CONSTITUTIONAL: Well-developed, well-nourished female in no acute distress.  ?HEENT:  Normocephalic, atraumatic. External right and left ear normal. No scleral icterus.  ?NECK: Normal range of motion, supple, no masses noted on observation ?SKIN: No rash noted. Not diaphoretic. No erythema. No pallor. ?MUSCULOSKELETAL: Normal range of motion. No edema noted. ?NEUROLOGIC: Alert and oriented to person, place, and time. Normal muscle tone coordination.  ?PSYCHIATRIC: Normal mood and affect. Normal behavior. Normal judgment and thought content. ?RESPIRATORY: Effort normal, no problems with respiration noted ? ? ?Labs and Imaging ?No results found for this or any previous visit (from the past 168 hour(s)). ?US OB Transvaginal ? ?Result Date: 12/09/2021 ?CLINICAL DATA:  Fetal viability. EXAM: TRANSVAGINAL OB ULTRASOUND TECHNIQUE: Transvaginal ultrasound was performed for complete evaluation of the gestation as well as the maternal uterus, adnexal regions, and pelvic cul-de-sac. COMPARISON:  December 02, 2021.  November 19, 2021. FINDINGS: Intrauterine gestational sac: Single Yolk sac:  Visualized. Embryo:  Visualized. Cardiac Activity: Not Visualized. CRL:   4.36 mm   6 w 1 d                  [place] EDC: August 03, 2022. Subchorionic hemorrhage:  None visualized. Maternal uterus/adnexae: Probable corpus luteum  seen in left ovary. Right ovary is unremarkable. No free fluid is noted. IMPRESSION: Findings are suspicious but not yet definitive for failed pregnancy. Recommend follow-up US in 10-14 days for definitive diagnosis. This recommendation follows SRU consensus guidelines: Diagnostic Criteria for Nonviable Pregnancy Early in the First Trimester. Malva Limes Med 2013; 027:2536-64.  Electronically Signed   By: Lupita Raider M.D.   On: 12/09/2021 08:48  ? ?US OB Transvaginal ? ?Addendum Date: 12/02/2021   ?ADDENDUM REPORT: 12/02/2021 11:54 ADDENDUM: This is an addendum for report of the exam performed on 12/01/2021. Although an embryo is now visualized on this study, no embryonic cardiac activity is seen on this exam, so this should not be considered a living IUP at this point. Although there has been some progression of the IUP since the earlier exam on 11/19/2021, it is less than expected for the time interval, which raises suspicion for a failed IUP. CORRECTED IMPRESSION: Single IUP shows less than expected progression since 11/20/2027, which raises suspicion but is not yet definitive for failed pregnancy. Recommend continued follow-up by ultrasound in 1 week. These results were called by telephone at the time of interpretation on 12/02/2021 at 11:47 am to provider Dr. Crissie Reese, who verbally acknowledged these results. Electronically Signed   By: Danae Orleans M.D.   On: 12/02/2021 11:54  ? ?Result Date: 12/02/2021 ?CLINICAL DATA:  Rule out ectopic pregnancy. EXAM: OBSTETRIC <14 WK [place] AND TRANSVAGINAL OB US TECHNIQUE: Both transabdominal and transvaginal ultrasound examinations were performed for complete evaluation of the gestation as well as the maternal uterus, adnexal regions, and pelvic cul-de-sac. Transvaginal technique was performed to assess early pregnancy. COMPARISON:  None. FINDINGS: Intrauterine gestational sac: Single Yolk sac:  Visualized. Embryo:  Not Visualized. Cardiac Activity: Not Visualized. MSD: 4.2 mm   5 w   1 d Subchorionic hemorrhage:  Small subchorionic hemorrhage Maternal uterus/adnexae: Uterus and bilateral adnexa unremarkable. IMPRESSION: 1. Intrauterine gestational sac with a yolk sac with mean sac diameter of 4.2 mm, corresponding to gestational age of [redacted] weeks and 1 day. The fetal pole is not visualized, likely secondary to early gestation. 2.  Small subchorionic  hemorrhage. 3.  Uterus and bilateral adnexa are unremarkable. Electronically Signed: By: Larose Hires D.O. On: 11/19/2021 09:11  ? ?US OB LESS THAN 14 WEEKS WITH OB TRANSVAGINAL ? ?Addendum Date: 12/02/2021   ?ADDENDUM REPORT: 12/02/2021 11:54 ADDENDUM: This is an addendum for report of the exam performed on 12/01/2021. Although an embryo is now visualized on this study, no embryonic cardiac activity is seen on this exam, so this should not be considered a living IUP at this point. Although there has been some progression of the IUP since the earlier exam on 11/19/2021, it is less than expected for the time interval, which raises suspicion for a failed IUP. CORRECTED IMPRESSION: Single IUP shows less than expected progression since 11/20/2027, which raises suspicion but is not yet definitive for failed pregnancy. Recommend continued follow-up by ultrasound in 1 week. These results were called by telephone at the time of interpretation on 12/02/2021 at 11:47 am to provider Dr. Crissie Reese, who verbally acknowledged these results. Electronically Signed   By: Danae Orleans M.D.   On: 12/02/2021 11:54  ? ?Addendum Date: 12/02/2021   ?ADDENDUM REPORT: 12/02/2021 11:08 ADDENDUM: This is a complete report for a separate examination performed on 12/02/2021, as follow-up to the exam on 11/19/2021 for which the report is also shown below. CLINICAL DATA:  First trimester pregnancy with inconclusive  fetal viability. EXAM: TRANSVAGINAL OB US TECHNIQUE: Transvaginal ultrasound examination was performed for complete evaluation of the gestation as well as the maternal uterus, adnexal regions, and pelvic cul-de-sac. COMPARISON:  11/19/2021 FINDINGS: Intrauterine gestational sac: Single Yolk sac:  Visualized. Embryo:  Visualized. CRL: 4 mm   6 w   1 d  US EDC: 07/27/2022 Subchorionic hemorrhage:  None visualized. Maternal uterus/adnexae: Normal appearance of both ovaries. No mass or abnormal free fluid identified. IMPRESSION: Single living  IUP with estimated gestational age of [redacted] weeks 1 day, and US EDC of 07/27/2022. Electronically Signed   By: Danae OrleansJohn A Stahl M.D.   On: 12/02/2021 11:08  ? ?Result Date: 12/02/2021 ?CLINICAL DATA:  Rule out ectopic pregnan

## 2021-12-09 NOTE — Progress Notes (Cosign Needed)
Pt notified that the [facility] order was cancelled.  I explained that getting the HCG was not going to give [place] any information other than it would be high.  I told her that the level would stay high unless she started having bleeding.  She is okay with that decision.  I am going to discuss with Dr Damita Dunnings about possibly getting her a repeat U/S in one week instead of pt having to wait 3 weeks for her next scan. ?

## 2021-12-09 NOTE — Progress Notes (Cosign Needed)
Pt walked into office today wanting to get her BHCG drawn today and wants it repeated on Friday.  She did have have a repeat U/S today @ [facility] and spoke with Dr Crissie Reese who told pt that the U/S shows suspicion for non viable pregnancy.  She is scheduled for a repeat U/S in 1 week.  Pt is in denial about the U/S results because per pt"I'm not having any bleeding". ?

## 2021-12-09 NOTE — Assessment & Plan Note (Signed)
Reviewed images personally and with Dr. Macon Large as well, despite radiology read we are in agreement that her overall clinical course is consistent with non-viable pregnancy. Patient understandably upset with this news and also understandably questioning diagnosis given [place] report as well as small differences in appearance of the [place] compared to prior. We discussed that I am comfortable and confident in the diagnosis of non-viable pregnancy but understand if she continues to have doubts. In that scenario she would prefer a D&C but does not want to undergo the procedure if there is any doubt. Given her concerns not unreasonable to schedule one more [place] in two weeks to confirm accurate diagnosis so that she can be at peace with result. [place] scheduled with visit to follow in two weeks.  ?

## 2021-12-10 ENCOUNTER — Ambulatory Visit: Payer: Medicaid Other

## 2021-12-10 ENCOUNTER — Ambulatory Visit: Payer: Medicaid Other | Admitting: Family Medicine

## 2021-12-11 ENCOUNTER — Encounter: Payer: Self-pay | Admitting: Obstetrics and Gynecology

## 2021-12-11 ENCOUNTER — Other Ambulatory Visit: Payer: Medicaid Other

## 2021-12-13 NOTE — Progress Notes (Signed)
? ?GYNECOLOGY OFFICE VISIT NOTE ? ?History:  ? Susan Krause is a 31 y.o. [id number] here today for follow up from her [place] results.  ? ?She had an [place] on 3/9 which showed a gestational sac and yolk sac. ?She had an [place] on 3/22 which showed CRL 37mm and no cardiac activity.  ?She had an [place] on 3/29 which showed CRL 4.39 and no cardiac activity.  ? ?She notes vaginal discharge. She thinks it seems more like BV.  ? ?Overnight she had some spotting and cramping. None this morning.  ? ? ?  ?Past Medical History:  ?Diagnosis Date  ? ADHD (attention deficit hyperactivity disorder)   ? No meds  ? Cervical dysplasia   ? Chlamydia   ? Dyspnea   ? 06/30/2020- climbing stairs- while I'm pregnant  ? Head injury with loss of consciousness (HCC) 03/2020  ? Migraine   ? Propanolol 10 mg  ? OCD (obsessive compulsive disorder)   ? PTSD (post-traumatic stress disorder)   ? no meds  ? PVC (premature ventricular contraction)   ? PVC's (premature ventricular contractions) 2018  ? ? ?Past Surgical History:  ?Procedure Laterality Date  ? CESAREAN SECTION N/A 07/18/2018  ? Procedure: CESAREAN SECTION;  Surgeon: Essie Hart, MD;  Location: [facility] BIRTHING SUITES;  Service: Obstetrics;  Laterality: N/A;  ? DIAGNOSTIC LAPAROSCOPY WITH REMOVAL OF ECTOPIC PREGNANCY Left 12/20/2020  ? Procedure: LAPAROSCOPIC LEFT SALPINGECTOMY WITH REMOVAL OF ECTOPIC PREGNANCY;  Surgeon: [name], MD;  Location: [facility] OR;  Service: Gynecology;  Laterality: Left;  ? DILATION AND CURETTAGE OF UTERUS N/A 07/01/2021  ? Procedure: DILATATION AND CURETTAGE WITH PATHOLOGY;  Surgeon: Milas Hock, MD;  Location: [facility] OR;  Service: Gynecology;  Laterality: N/A;  ? SALPINGECTOMY Left 12/2020  ? WISDOM TOOTH EXTRACTION    ? ? ?The following portions of the patient's history were reviewed and updated as appropriate: allergies, current medications, past family history, past medical history, past social history, past surgical history and problem list.  ? ?Health Maintenance:   ?Diagnosis   ?Date Value Ref Range Status  ?01/22/2021     ? - Negative for intraepithelial lesion or malignancy (NILM)  ? ?Review of Systems:  ?Pertinent items noted in HPI and remainder of comprehensive ROS otherwise negative. ? ?Physical Exam:  ?BP (!) 139/93   Pulse (!) 102   Ht 5\' 1"  (1.549 m)   Wt 187 lb (84.8 kg)   LMP 10/11/2021   BMI 35.33 kg/m?  ?CONSTITUTIONAL: Well-developed, well-nourished female in no acute distress.  ?HEENT:  Normocephalic, atraumatic. External right and left ear normal. No scleral icterus.  ?NECK: Normal range of motion, supple, no masses noted on observation ?SKIN: No rash noted. Not diaphoretic. No erythema. No pallor. ?MUSCULOSKELETAL: Normal range of motion. No edema noted. ?NEUROLOGIC: Alert and oriented to person, place, and time. Normal muscle tone coordination. No cranial nerve deficit noted. ?PSYCHIATRIC: Normal mood and affect. Normal behavior. Normal judgment and thought content. ? ?CARDIOVASCULAR: Normal heart rate noted ?RESPIRATORY: Effort and breath sounds normal, no problems with respiration noted ?ABDOMEN: No masses noted. No other overt distention noted.   ? ?PELVIC: Deferred ? ?Labs and Imaging ?No results found for this or any previous visit (from the past 168 hour(s)). ?10/13/2021 OB Transvaginal ? ?Result Date: 12/09/2021 ?CLINICAL DATA:  Fetal viability. EXAM: TRANSVAGINAL OB ULTRASOUND TECHNIQUE: Transvaginal ultrasound was performed for complete evaluation of the gestation as well as the maternal uterus, adnexal regions, and pelvic cul-de-sac. COMPARISON:  December 02, 2021.  November 19, 2021. FINDINGS: Intrauterine gestational sac: Single Yolk sac:  Visualized. Embryo:  Visualized. Cardiac Activity: Not Visualized. CRL:   4.36 mm   6 w 1 d                  [place] EDC: August 03, 2022. Subchorionic hemorrhage:  None visualized. Maternal uterus/adnexae: Probable corpus luteum seen in left ovary. Right ovary is unremarkable. No free fluid is noted. IMPRESSION: Findings are suspicious  but not yet definitive for failed pregnancy. Recommend follow-up US in 10-14 days for definitive diagnosis. This recommendation follows SRU consensus guidelines: Diagnostic Criteria for Nonviable Pregnancy Early in the First Trimester. Malva Limes Med 2013; 335:4562-56. Electronically Signed   By: Lupita Raider M.D.   On: 12/09/2021 08:48  ? ?US OB Transvaginal ? ?Addendum Date: 12/02/2021   ?ADDENDUM REPORT: 12/02/2021 11:54 ADDENDUM: This is an addendum for report of the exam performed on 12/01/2021. Although an embryo is now visualized on this study, no embryonic cardiac activity is seen on this exam, so this should not be considered a living IUP at this point. Although there has been some progression of the IUP since the earlier exam on 11/19/2021, it is less than expected for the time interval, which raises suspicion for a failed IUP. CORRECTED IMPRESSION: Single IUP shows less than expected progression since 11/20/2027, which raises suspicion but is not yet definitive for failed pregnancy. Recommend continued follow-up by ultrasound in 1 week. These results were called by telephone at the time of interpretation on 12/02/2021 at 11:47 am to provider Dr. Crissie Reese, who verbally acknowledged these results. Electronically Signed   By: Danae Orleans M.D.   On: 12/02/2021 11:54  ? ?Result Date: 12/02/2021 ?CLINICAL DATA:  Rule out ectopic pregnancy. EXAM: OBSTETRIC <14 WK [place] AND TRANSVAGINAL OB US TECHNIQUE: Both transabdominal and transvaginal ultrasound examinations were performed for complete evaluation of the gestation as well as the maternal uterus, adnexal regions, and pelvic cul-de-sac. Transvaginal technique was performed to assess early pregnancy. COMPARISON:  None. FINDINGS: Intrauterine gestational sac: Single Yolk sac:  Visualized. Embryo:  Not Visualized. Cardiac Activity: Not Visualized. MSD: 4.2 mm   5 w   1 d Subchorionic hemorrhage:  Small subchorionic hemorrhage Maternal uterus/adnexae: Uterus and  bilateral adnexa unremarkable. IMPRESSION: 1. Intrauterine gestational sac with a yolk sac with mean sac diameter of 4.2 mm, corresponding to gestational age of [redacted] weeks and 1 day. The fetal pole is not visualized, likely secondary to early gestation. 2.  Small subchorionic hemorrhage. 3.  Uterus and bilateral adnexa are unremarkable. Electronically Signed: By: Larose Hires D.O. On: 11/19/2021 09:11  ? ?US OB LESS THAN 14 WEEKS WITH OB TRANSVAGINAL ? ?Addendum Date: 12/02/2021   ?ADDENDUM REPORT: 12/02/2021 11:54 ADDENDUM: This is an addendum for report of the exam performed on 12/01/2021. Although an embryo is now visualized on this study, no embryonic cardiac activity is seen on this exam, so this should not be considered a living IUP at this point. Although there has been some progression of the IUP since the earlier exam on 11/19/2021, it is less than expected for the time interval, which raises suspicion for a failed IUP. CORRECTED IMPRESSION: Single IUP shows less than expected progression since 11/20/2027, which raises suspicion but is not yet definitive for failed pregnancy. Recommend continued follow-up by ultrasound in 1 week. These results were called by telephone at the time of interpretation on 12/02/2021 at 11:47 am to provider Dr. Crissie Reese, who verbally acknowledged these  results. Electronically Signed   By: Danae OrleansJohn A Stahl M.D.   On: 12/02/2021 11:54  ? ?Addendum Date: 12/02/2021   ?ADDENDUM REPORT: 12/02/2021 11:08 ADDENDUM: This is a complete report for a separate examination performed on 12/02/2021, as follow-up to the exam on 11/19/2021 for which the report is also shown below. CLINICAL DATA:  First trimester pregnancy with inconclusive fetal viability. EXAM: TRANSVAGINAL OB US TECHNIQUE: Transvaginal ultrasound examination was performed for complete evaluation of the gestation as well as the maternal uterus, adnexal regions, and pelvic cul-de-sac. COMPARISON:  11/19/2021 FINDINGS: Intrauterine  gestational sac: Single Yolk sac:  Visualized. Embryo:  Visualized. CRL: 4 mm   6 w   1 d  US EDC: 07/27/2022 Subchorionic hemorrhage:  None visualized. Maternal uterus/adnexae: Normal appearance of both ovaries. No mass o

## 2021-12-17 ENCOUNTER — Encounter: Payer: Self-pay | Admitting: Obstetrics and Gynecology

## 2021-12-17 ENCOUNTER — Telehealth: Payer: Self-pay

## 2021-12-17 ENCOUNTER — Ambulatory Visit (INDEPENDENT_AMBULATORY_CARE_PROVIDER_SITE_OTHER): Payer: BC Managed Care – PPO

## 2021-12-17 ENCOUNTER — Other Ambulatory Visit (HOSPITAL_COMMUNITY)
Admission: RE | Admit: 2021-12-17 | Discharge: 2021-12-17 | Disposition: A | Discharge status: Home / Self Care | Payer: Medicaid Other | Source: Ambulatory Visit | Attending: Obstetrics and Gynecology | Admitting: Obstetrics and Gynecology

## 2021-12-17 ENCOUNTER — Telehealth: Payer: Self-pay | Admitting: Obstetrics and Gynecology

## 2021-12-17 ENCOUNTER — Ambulatory Visit (INDEPENDENT_AMBULATORY_CARE_PROVIDER_SITE_OTHER): Payer: Medicaid Other | Admitting: Obstetrics and Gynecology

## 2021-12-17 VITALS — BP 139/93 | HR 102 | Ht 61.0 in | Wt 187.0 lb

## 2021-12-17 DIAGNOSIS — Z3A01 Less than 8 weeks gestation of pregnancy: Secondary | ICD-10-CM

## 2021-12-17 DIAGNOSIS — O200 Threatened abortion: Secondary | ICD-10-CM | POA: Diagnosis not present

## 2021-12-17 DIAGNOSIS — B3731 Acute candidiasis of vulva and vagina: Secondary | ICD-10-CM

## 2021-12-17 DIAGNOSIS — N898 Other specified noninflammatory disorders of vagina: Secondary | ICD-10-CM

## 2021-12-17 MED ORDER — METRONIDAZOLE 0.75 % VA GEL: 1.0000 | Freq: Every day | VAGINAL | 5 refills | Status: DC

## 2021-12-17 NOTE — Telephone Encounter (Signed)
Spoke with [employer] again - she is having more cramping but still brown d/c.  ? ?Reviewed if she has SAB over the weekend, to let us know on Monday and we can cancel the surgery. I then recommend f/u US one week from presumed SAB. If she passes what she recognizes as tissue she can bring that to the office for genetic testing.  ? ?Reviewed bleeding precautions, soaking super pads for more than 2 hours.  ? ?Reviewed if she has concerns, she can also go to the MAU for evaluation or call the nurse triage line.  ? ?Radene Gunning, MD ?Attending [place], Faculty Practice ?[facility] ? ? ?

## 2021-12-17 NOTE — Telephone Encounter (Signed)
Called patient, surgery date, time and preop instructions given, patient expressed understanding. 

## 2021-12-17 NOTE — Addendum Note (Signed)
Addended by: Milas Hock A on: 12/17/2021 08:55 AM ? ? Modules accepted: Orders ? ?

## 2021-12-17 NOTE — H&P (Signed)
Faculty Practice Obstetrics and Gynecology Attending History and Physical ? ?Susan Krause is a 31 y.o. [id number] at [redacted]w[redacted]d by dates but 6w by measurements who has a missed abortion.  ? ?Her [place] history is as follows: ?She had an [place] on 3/9 which showed a gestational sac and yolk sac. ?She had an [place] on 3/22 which showed CRL 29mm and no cardiac activity.  ?She had an [place] on 3/29 which showed CRL 4.39 and no cardiac activity.  ?She has an [place] on 4/6 which showed CRL unchanged and no cardiac activity c/w failed pregnancy.  ? ?She has some cramping and brown discharge.  ? ?She does have history of 2 ectopics - 1 she had salpingectomy and one she had MTX.  ? ? ?Past Medical History:  ?Diagnosis Date  ? ADHD (attention deficit hyperactivity disorder)   ? No meds  ? Cervical dysplasia   ? Chlamydia   ? Dyspnea   ? 06/30/2020- climbing stairs- while I'm pregnant  ? Head injury with loss of consciousness ([place]) 03/2020  ? Migraine   ? Propanolol 10 mg  ? OCD (obsessive compulsive disorder)   ? PTSD (post-traumatic stress disorder)   ? no meds  ? PVC (premature ventricular contraction)   ? PVC's (premature ventricular contractions) 2018  ? ?Past Surgical History:  ?Procedure Laterality Date  ? CESAREAN SECTION N/A 07/18/2018  ? Procedure: CESAREAN SECTION;  Surgeon: Sanjuana Kava, MD;  Location: [facility];  Service: Obstetrics;  Laterality: N/A;  ? DIAGNOSTIC LAPAROSCOPY WITH REMOVAL OF ECTOPIC PREGNANCY Left 12/20/2020  ? Procedure: LAPAROSCOPIC LEFT SALPINGECTOMY WITH REMOVAL OF ECTOPIC PREGNANCY;  Surgeon: Donnamae Jude, MD;  Location: [facility];  Service: Gynecology;  Laterality: Left;  ? DILATION AND CURETTAGE OF UTERUS N/A 07/01/2021  ? Procedure: DILATATION AND CURETTAGE WITH PATHOLOGY;  Surgeon: Radene Gunning, MD;  Location: [facility];  Service: Gynecology;  Laterality: N/A;  ? SALPINGECTOMY Left 12/2020  ? WISDOM TOOTH EXTRACTION    ? ?OB History  ?Gravida Para Term Preterm AB Living  ?5 1 1   2 1   ?SAB IAB Ectopic Multiple Live  Births  ?1   1 0 1  ?  ?# Outcome Date GA Lbr Len/2nd Weight Sex Delivery Anes PTL Lv  ?5 Current           ?4 SAB 05/2021          ?3 Ectopic 12/20/20 [redacted]w[redacted]d         ?2 Term 07/18/18 [redacted]w[redacted]d / 04:17 3715 g M CS-Vac EPI  LIV  ?1 Gravida           ?Patient denies any other pertinent gynecologic issues.  ?No current facility-administered medications on file prior to encounter.  ? ?Current Outpatient Medications on File Prior to Encounter  ?Medication Sig Dispense Refill  ? acetaminophen (TYLENOL) 325 MG tablet Take 650 mg by mouth every 6 (six) hours as needed for mild pain.    ? Cetirizine HCl (ZYRTEC ALLERGY PO) Take 1 tablet by mouth daily as needed (allergies).    ? ketoconazole (NIZORAL) 2 % shampoo Apply 1 application. topically once a week.    ? labetalol (NORMODYNE) 100 MG tablet Take 100 mg by mouth daily.    ? Prenatal Vit-Fe Fumarate-FA (MULTIVITAMIN-PRENATAL) 27-0.8 MG TABS tablet Take 1 tablet by mouth daily at 12 noon.    ? Probiotic Product (PROBIOTIC-10 PO) Take 1 tablet by mouth daily.    ? metroNIDAZOLE (METROGEL) 0.75 % vaginal gel Place 1 Applicatorful  vaginally at bedtime. Apply one applicatorful to vagina at bedtime for 10 days, then twice a week for 6 months. 70 g 5  ? promethazine (PHENERGAN) 25 MG tablet Take 1/2-1 PO every 6 hrs prn nausea. (Patient not taking: Reported on 12/17/2021) 30 tablet 1  ? ?Allergies  ?Allergen Reactions  ? Banana Swelling  ? Latex Swelling  ? Contrast Media [Iodinated Contrast Media] Palpitations  ? Gardasil [Hpv 4-Valent Vaccine Recombinant Vaccine]   ? Hpv Bival (Type 16,18) Recomb Vaccine  [Human Papillomavirus (16,18) Recomb Vac] Hives  ? Peanut Oil Swelling  ?  Tongue swells/itching  ? Pumpkin Flavor Swelling  ? Lactose Intolerance (Gi) Other (See Comments)  ?  Gi upset  ? ? ?Social History:   reports that she has never smoked. She has never used smokeless tobacco. She reports that she does not drink alcohol and does not use drugs. ?Family History  ?Problem Relation  Age of Onset  ? ADD / ADHD Mother   ? Anxiety disorder Mother   ? Depression Mother   ? Hyperlipidemia Mother   ? Obesity Mother   ? ADD / ADHD Father   ? Anxiety disorder Sister   ? Asthma Sister   ? Depression Sister   ? Intellectual disability Brother   ? Learning disabilities Brother   ? ADD / ADHD Sister   ? Anxiety disorder Maternal Aunt   ? Diabetes Maternal Aunt   ? Alcohol abuse Maternal Uncle   ? Hyperlipidemia Maternal Uncle   ? Anxiety disorder Paternal Uncle   ? Diabetes Paternal Uncle   ? Anxiety disorder Maternal Grandmother   ? Diabetes Maternal Grandmother   ? Hyperlipidemia Maternal Grandmother   ? Varicose Veins Maternal Grandmother   ? Cancer Maternal Grandfather   ? Hyperlipidemia Maternal Grandfather   ? Obesity Maternal Grandfather   ? Vision loss Maternal Grandfather   ? Cancer Paternal Grandmother   ? ? ?Review of Systems: Pertinent items noted in HPI and remainder of comprehensive ROS otherwise negative. ? ?PHYSICAL EXAM: ?Last menstrual period 10/11/2021. ?CONSTITUTIONAL: Well-developed, well-nourished female in no acute distress.  ?HENT:  Normocephalic, atraumatic, External right and left ear normal. Oropharynx is clear and moist ?EYES: Conjunctivae and EOM are normal. Pupils are equal, round, and reactive to light. No scleral icterus.  ?NECK: Normal range of motion, supple, no masses ?SKIN: Skin is warm and dry. No rash noted. Not diaphoretic. No erythema. No pallor. ?NEUROLOGIC: Alert and oriented to person, place, and time. Normal reflexes, muscle tone coordination. No cranial nerve deficit noted. ?PSYCHIATRIC: Normal mood and affect. Normal behavior. Normal judgment and thought content. ?CARDIOVASCULAR: Normal heart rate noted, regular rhythm ?RESPIRATORY: Effort and breath sounds normal, no problems with respiration noted ?ABDOMEN: Soft, nontender, nondistended. ?PELVIC: Deferred  ?MUSCULOSKELETAL: Normal range of motion. No tenderness.  No cyanosis, clubbing, or edema.  2+ distal  pulses. ? ?Labs: ?No results found for this or any previous visit (from the past 336 hour(s)). ? ?Imaging Studies: ?US OB Transvaginal ? ?Result Date: 12/09/2021 ?CLINICAL DATA:  Fetal viability. EXAM: TRANSVAGINAL OB ULTRASOUND TECHNIQUE: Transvaginal ultrasound was performed for complete evaluation of the gestation as well as the maternal uterus, adnexal regions, and pelvic cul-de-sac. COMPARISON:  December 02, 2021.  November 19, 2021. FINDINGS: Intrauterine gestational sac: Single Yolk sac:  Visualized. Embryo:  Visualized. Cardiac Activity: Not Visualized. CRL:   4.36 mm   6 w 1 d  [place] EDC: August 03, 2022. Subchorionic hemorrhage:  None visualized. Maternal uterus/adnexae: Probable corpus luteum seen in left ovary. Right ovary is unremarkable. No free fluid is noted. IMPRESSION: Findings are suspicious but not yet definitive for failed pregnancy. Recommend follow-up US in 10-14 days for definitive diagnosis. This recommendation follows SRU consensus guidelines: Diagnostic Criteria for Nonviable Pregnancy Early in the First Trimester. [employer] Corning Med [id number]. Electronically Signed   By: Marijo Conception M.D.   On: 12/09/2021 08:48  ? ?US OB Transvaginal ? ?Addendum Date: 12/02/2021   ?ADDENDUM REPORT: 12/02/2021 11:54 ADDENDUM: This is an addendum for report of the exam performed on 12/01/2021. Although an embryo is now visualized on this study, no embryonic cardiac activity is seen on this exam, so this should not be considered a living IUP at this point. Although there has been some progression of the IUP since the earlier exam on 11/19/2021, it is less than expected for the time interval, which raises suspicion for a failed IUP. CORRECTED IMPRESSION: Single IUP shows less than expected progression since 11/20/2027, which raises suspicion but is not yet definitive for failed pregnancy. Recommend continued follow-up by ultrasound in 1 week. These results were called by telephone at the time of  interpretation on 12/02/2021 at 11:47 am to provider Dr. Dione Plover, who verbally acknowledged these results. Electronically Signed   By: Marlaine Hind M.D.   On: 12/02/2021 11:54  ? ?Result Date: 12/02/2021 ?C

## 2021-12-17 NOTE — Progress Notes (Signed)
Pt had some cramping and light spotting last night. Pt is not cramping today and has not had any spotting today ?

## 2021-12-17 NOTE — Telephone Encounter (Signed)
Reviewed US findings are definitive. Reviewed images myself as well as all [place] reports. Crl remains about 4 mm and now blood within the gestational sac. It has been about 4 mm since 3/9.  ? ?- Discussed common causes of SAB. Reassured her this was nothing in her control.  ?- Discussed that she should wait for one normal cycle and then they may begin trying again if she feels emotionally ready as well ?- Reviewed 2 SABs in a row is still quite common, about a 4% chance. If 3 in a row, would do work up. Since  second loss, I would recommend genetics.  ? ?- Discussed options: expectant management, misoprostol and D&E. Discussed the risks and benefits to each.   ?- We discussed dosing and process of miso: Discussed normal bleeding and cramping with the medication. Discussed pain medication often times needed when medically induced for missed abortion. Discussed success rate is about 90% depending on gestational age at the time.  ?- We discussed genetics: we reviewed the benefits - she Accepts ?- Risks of surgery include but are not limited to: bleeding, infection, injury to surrounding organs/tissues (i.e. bowel/bladder/ureters), need for additional procedures, wound complications, hospital re-admission, and conversion to open surgery. I offered office MVA and she declined the option.  ?- We discussed postop restrictions, precautions and expectations. We discussed typical hospital course and stay.  ?- She would like: Surgical management ? ? ?Milas Hock, MD ?Attending Obstetrician & Gynecologist, Faculty Practice ?Center for [employer], [facility] ? ? ?

## 2021-12-18 ENCOUNTER — Inpatient Hospital Stay (HOSPITAL_COMMUNITY): Payer: Medicaid Other

## 2021-12-18 ENCOUNTER — Other Ambulatory Visit: Payer: Self-pay

## 2021-12-18 ENCOUNTER — Inpatient Hospital Stay (HOSPITAL_COMMUNITY)
Admission: AD | Admit: 2021-12-18 | Discharge: 2021-12-18 | Disposition: A | Discharge status: Home / Self Care | Payer: Medicaid Other | Attending: Obstetrics & Gynecology | Admitting: Obstetrics & Gynecology

## 2021-12-18 ENCOUNTER — Encounter (HOSPITAL_COMMUNITY): Payer: Self-pay | Admitting: Obstetrics & Gynecology

## 2021-12-18 DIAGNOSIS — R109 Unspecified abdominal pain: Secondary | ICD-10-CM | POA: Insufficient documentation

## 2021-12-18 DIAGNOSIS — Z3A09 9 weeks gestation of pregnancy: Secondary | ICD-10-CM | POA: Diagnosis not present

## 2021-12-18 DIAGNOSIS — O039 Complete or unspecified spontaneous abortion without complication: Secondary | ICD-10-CM | POA: Insufficient documentation

## 2021-12-18 DIAGNOSIS — O26891 Other specified pregnancy related conditions, first trimester: Secondary | ICD-10-CM | POA: Insufficient documentation

## 2021-12-18 DIAGNOSIS — O09291 Supervision of pregnancy with other poor reproductive or obstetric history, first trimester: Secondary | ICD-10-CM | POA: Insufficient documentation

## 2021-12-18 HISTORY — DX: Tachycardia, unspecified: R00.0

## 2021-12-18 LAB — CERVICOVAGINAL ANCILLARY ONLY
Bacterial Vaginitis (gardnerella): NEGATIVE
Candida Glabrata: NEGATIVE
Candida Vaginitis: POSITIVE — AB
Chlamydia: NEGATIVE
Comment: NEGATIVE
Comment: NEGATIVE
Comment: NEGATIVE
Comment: NEGATIVE
Comment: NEGATIVE
Comment: NORMAL
Neisseria Gonorrhea: NEGATIVE
Trichomonas: NEGATIVE

## 2021-12-18 LAB — CBC
HCT: 36.3 % (ref 36.0–46.0)
Hemoglobin: 12.4 g/dL (ref 12.0–15.0)
MCH: 29.1 pg (ref 26.0–34.0)
MCHC: 34.2 g/dL (ref 30.0–36.0)
MCV: 85.2 fL (ref 80.0–100.0)
Platelets: 294 10*3/uL (ref 150–400)
RBC: 4.26 MIL/uL (ref 3.87–5.11)
RDW: 12.4 % (ref 11.5–15.5)
WBC: 11.3 10*3/uL — ABNORMAL HIGH (ref 4.0–10.5)
nRBC: 0 % (ref 0.0–0.2)

## 2021-12-18 LAB — HCG, QUANTITATIVE, PREGNANCY: hCG, Beta Chain, Quant, S: 11525 m[IU]/mL — ABNORMAL HIGH (ref <5)

## 2021-12-18 MED ORDER — PROMETHAZINE HCL 25 MG PO TABS
12.5000 mg | ORAL_TABLET | Freq: Once | ORAL | Status: AC
  Administered 2021-12-18: 12.5 mg via ORAL
  Filled 2021-12-18: qty 1

## 2021-12-18 MED ORDER — OXYCODONE HCL 5 MG PO TABS
10.0000 mg | ORAL_TABLET | Freq: Once | ORAL | Status: AC
  Administered 2021-12-18: 10 mg via ORAL
  Filled 2021-12-18: qty 2

## 2021-12-18 NOTE — Progress Notes (Signed)
Susan Krause denies chest pain or shortness of breath. Patient states she has palpations when she has PVC's, Susan Krause takes Labatol at hs. ? ?Susan Krause's PCP is Susan Krause, [place]. ? ?I instructed patient to Susan Krause  to shower  with antibacteria soap.  DO not shave. No nail polish, artificial or acrylic nails. Wear clean clothes, brush your teeth. ?Glasses, contact lens,dentures or partials may not be worn in the OR. If you need to wear them, please bring a case for glasses, do not wear contacts or bring a case, the hospital does not have contact cases, dentures or partials will have to be removed , make sure they are clean, we will provide a denture cup to put them in. You will need some one to drive you home and a responsible person over the age of 47 to stay with you for the first 24 hours after surgery.  ?

## 2021-12-18 NOTE — Progress Notes (Signed)
Per pt request anora kit sent.  ?

## 2021-12-18 NOTE — Discharge Instructions (Signed)
Return to MAU: If you have heavier bleeding that soaks through more that 2 pads per hour for an hour or more If you bleed so much that you feel like you might pass out or you do pass out If you have significant abdominal pain that is not improved with Tylenol 1000 mg every 8 hours as needed for pain If you develop a fever > 100.5  

## 2021-12-18 NOTE — MAU Provider Note (Signed)
?History  ?  ? ?CSN: [id number] ? ?Arrival date and time: 12/18/21 1831 ? ? Event Date/Time  ? First Provider Initiated Contact with Patient 12/18/21 1943   ?  ? ?Chief Complaint  ?Patient presents with  ? Abdominal Pain  ? ?[name] is a 31 y.o. year old [id number] female at [redacted]w[redacted]d weeks gestation who presents to MAU reporting constant, sharp abdominal pain/cramping x 3 hours; rated 8/10. She reports having small to medium amount of VB, but has had "several gushes of BRB since arriving to MAU." She last took ibuprofen @ 1630 for the cramping, but it is not helping anymore. She reports she was told yesterday that the embryo stopped growing and doesn't have a heartbeat. She is scheduled to have a D&C on Monday 12/21/2021 with Dr. Damita Dunnings. ? ? ?OB History   ? ? Gravida  ?5  ? Para  ?1  ? Term  ?1  ? Preterm  ?   ? AB  ?2  ? Living  ?1  ?  ? ? SAB  ?1  ? IAB  ?   ? Ectopic  ?1  ? Multiple  ?0  ? Live Births  ?1  ?   ?  ?  ? ? ?Past Medical History:  ?Diagnosis Date  ? ADHD (attention deficit hyperactivity disorder)   ? No meds  ? Cervical dysplasia   ? Chlamydia   ? Head injury with loss of consciousness (Susan Krause) 03/2020  ? Migraine   ? Propanolol 10 mg  ? OCD (obsessive compulsive disorder)   ? PTSD (post-traumatic stress disorder)   ? no meds  ? PVC (premature ventricular contraction)   ? PVC's (premature ventricular contractions) 2018  ? has palpations when she has PV's.  ? Sinus tachycardia   ? ? ?Past Surgical History:  ?Procedure Laterality Date  ? CESAREAN SECTION N/A 07/18/2018  ? Procedure: CESAREAN SECTION;  Surgeon: Sanjuana Kava, MD;  Location: [facility];  Service: Obstetrics;  Laterality: N/A;  ? DIAGNOSTIC LAPAROSCOPY WITH REMOVAL OF ECTOPIC PREGNANCY Left 12/20/2020  ? Procedure: LAPAROSCOPIC LEFT SALPINGECTOMY WITH REMOVAL OF ECTOPIC PREGNANCY;  Surgeon: Donnamae Jude, MD;  Location: [facility];  Service: Gynecology;  Laterality: Left;  ? DILATION AND CURETTAGE OF UTERUS N/A 07/01/2021  ? Procedure:  DILATATION AND CURETTAGE WITH PATHOLOGY;  Surgeon: Radene Gunning, MD;  Location: [facility];  Service: Gynecology;  Laterality: N/A;  ? SALPINGECTOMY Left 12/2020  ? WISDOM TOOTH EXTRACTION    ? ? ?Family History  ?Problem Relation Age of Onset  ? ADD / ADHD Mother   ? Anxiety disorder Mother   ? Depression Mother   ? Hyperlipidemia Mother   ? Obesity Mother   ? ADD / ADHD Father   ? Anxiety disorder Sister   ? Asthma Sister   ? Depression Sister   ? Intellectual disability Brother   ? Learning disabilities Brother   ? ADD / ADHD Sister   ? Anxiety disorder Maternal Aunt   ? Diabetes Maternal Aunt   ? Alcohol abuse Maternal Uncle   ? Hyperlipidemia Maternal Uncle   ? Anxiety disorder Paternal Uncle   ? Diabetes Paternal Uncle   ? Anxiety disorder Maternal Grandmother   ? Diabetes Maternal Grandmother   ? Hyperlipidemia Maternal Grandmother   ? Varicose Veins Maternal Grandmother   ? Cancer Maternal Grandfather   ? Hyperlipidemia Maternal Grandfather   ? Obesity Maternal Grandfather   ? Vision loss Maternal Grandfather   ? Cancer  Paternal Grandmother   ? ? ?Social History  ? ?Tobacco Use  ? Smoking status: Never  ? Smokeless tobacco: Never  ?Vaping Use  ? Vaping Use: Never used  ?Substance Use Topics  ? Alcohol use: No  ? Drug use: No  ? ? ?Allergies:  ?Allergies  ?Allergen Reactions  ? Banana Swelling  ? Latex Swelling  ? Contrast Media [Iodinated Contrast Media] Palpitations  ? Gardasil [Hpv 4-Valent Vaccine Recombinant Vaccine]   ? Hpv Bival (Type 16,18) Recomb Vaccine  [Human Papillomavirus (16,18) Recomb Vac] Hives  ? Peanut Oil Swelling  ?  Tongue swells/itching  ? Pumpkin Flavor Swelling  ? Lactose Intolerance (Gi) Other (See Comments)  ?  Gi upset  ? ? ?Medications Prior to Admission  ?Medication Sig Dispense Refill Last Dose  ? ibuprofen (ADVIL) 800 MG tablet Take 800 mg by mouth every 8 (eight) hours as needed.   12/18/2021 at 1630  ? labetalol (NORMODYNE) 100 MG tablet Take 100 mg by mouth daily.   12/17/2021  ?  Prenatal Vit-Fe Fumarate-FA (MULTIVITAMIN-PRENATAL) 27-0.8 MG TABS tablet Take 1 tablet by mouth daily at 12 noon.   12/17/2021  ? acetaminophen (TYLENOL) 325 MG tablet Take 650 mg by mouth every 6 (six) hours as needed for mild pain.     ? Cetirizine HCl (ZYRTEC ALLERGY PO) Take 1 tablet by mouth daily as needed (allergies).     ? ketoconazole (NIZORAL) 2 % shampoo Apply 1 application. topically once a week.     ? metroNIDAZOLE (METROGEL) 0.75 % vaginal gel Place 1 Applicatorful vaginally at bedtime. Apply one applicatorful to vagina at bedtime for 10 days, then twice a week for 6 months. 70 g 5   ? Probiotic Product (PROBIOTIC-10 PO) Take 1 tablet by mouth daily.     ? promethazine (PHENERGAN) 25 MG tablet Take 1/2-1 PO every 6 hrs prn nausea. (Patient not taking: Reported on 12/17/2021) 30 tablet 1   ? ? ?Review of Systems  ?Constitutional: Negative.   ?HENT: Negative.    ?Eyes: Negative.   ?Respiratory: Negative.    ?Cardiovascular: Negative.   ?Gastrointestinal: Negative.   ?Endocrine: Negative.   ?Genitourinary:  Positive for pelvic pain (constant, sharp cramping x 3 hours) and vaginal bleeding (became heavier and bright red upon arrival to MAU).  ?Musculoskeletal: Negative.   ?Skin: Negative.   ?Allergic/Immunologic: Negative.   ?Neurological: Negative.   ?Hematological: Negative.   ?Psychiatric/Behavioral: Negative.    ?Physical Exam  ? ?Patient Vitals for the past 24 hrs: ? BP Temp Temp src Pulse Resp SpO2 Height Weight  ?12/18/21 2014 124/86 -- -- 83 -- -- -- --  ?12/18/21 1919 (!) 125/92 -- -- 95 -- -- -- --  ?12/18/21 1858 131/90 98.5 ?F (36.9 ?C) Oral 93 18 97 % -- --  ?12/18/21 1851 -- -- -- -- -- -- 5\' 2"  (1.575 m) 85.2 kg  ? ? ?Physical Exam ?Vitals and nursing note reviewed. Exam conducted with a chaperone present.  ?Constitutional:   ?   Appearance: Normal appearance. She is obese.  ?Cardiovascular:  ?   Rate and Rhythm: Normal rate.  ?Pulmonary:  ?   Effort: Pulmonary effort is normal.  ?Abdominal:  ?    Palpations: Abdomen is soft.  ?Genitourinary: ?   General: Normal vulva.  ?   Comments: Pelvic exam: External genitalia normal, SE: vaginal walls pink and well rugated, cervix is smooth, pink, no lesions, large amount of blood clots and BRB, cervix not visualized, Uterus is non-tender,  no CMT or friability, no adnexal tenderness. ?POC collected for Anora testing. ?Skin: ?   General: Skin is warm and dry.  ?Neurological:  ?   Mental Status: She is alert and oriented to person, place, and time.  ?Psychiatric:     ?   Mood and Affect: Mood normal.     ?   Behavior: Behavior normal.     ?   Thought Content: Thought content normal.     ?   Judgment: Judgment normal.  ? ? ?MAU Course  ?Procedures ? ?MDM ?CBC ?HCG ?OB <14 wks [place] with TV ?Pelvic Exam ?Manual Removal of POC --> sent for Anora testing ? ?Results for orders placed or performed during the hospital encounter of 12/18/21 (from the past 24 hour(s))  ?CBC     Status: Abnormal  ? Collection Time: 12/18/21  8:23 PM  ?Result Value Ref Range  ? WBC 11.3 (H) 4.0 - 10.5 K/uL  ? RBC 4.26 3.87 - 5.11 MIL/uL  ? Hemoglobin 12.4 12.0 - 15.0 g/dL  ? HCT 36.3 36.0 - 46.0 %  ? MCV 85.2 80.0 - 100.0 fL  ? MCH 29.1 26.0 - 34.0 pg  ? MCHC 34.2 30.0 - 36.0 g/dL  ? RDW 12.4 11.5 - 15.5 %  ? Platelets 294 150 - 400 K/uL  ? nRBC 0.0 0.0 - 0.2 %  ?  ?US OB Transvaginal ? ?Result Date: 12/18/2021 ?CLINICAL DATA:  Vaginal bleeding EXAM: TRANSVAGINAL OB ULTRASOUND TECHNIQUE: Transvaginal ultrasound was performed for complete evaluation of the gestation as well as the maternal uterus, adnexal regions, and pelvic cul-de-sac. COMPARISON:  12/17/2021 FINDINGS: Intrauterine gestational sac: None Yolk sac:  Not seen Embryo:  Not Cardiac Activity: Not seen Subchorionic hemorrhage:  None visualized. Maternal uterus/adnexae: Uterus is retroverted. Endometrial stripe measures 9 mm. There is slightly inhomogeneous echogenicity in the endometrium. Cervix is closed. Ovaries are unremarkable. There are  no adnexal masses. There is no free fluid in the pelvis. IMPRESSION: There is interval passage of gestational sac consistent with failed gestation and complete abortion. There are no adnexal masses. There is

## 2021-12-18 NOTE — MAU Note (Signed)
Susan Krause is a 31 y.o. at [redacted]w[redacted]d here in MAU reporting: constant sharp abdominal pain & cramping x3 hours. Reports also having small to medium VB.  Reports told yesterday embryo stopped growing & doesn't have a heart.  Scheduled for D&C on Monday with Dr. Damita Dunnings.  Reports last took Ibuprofen @ 1630 this afternoon. ? ?Onset of complaint: todat ?Pain score: 8 ?There were no vitals filed for this visit.   ?FHT:N/A ?Lab orders placed from triage:    ?

## 2021-12-19 ENCOUNTER — Inpatient Hospital Stay (HOSPITAL_COMMUNITY)
Admission: AD | Admit: 2021-12-19 | Discharge: 2021-12-20 | Disposition: A | Discharge status: Home / Self Care | Payer: Medicaid Other | Attending: Obstetrics and Gynecology | Admitting: Obstetrics and Gynecology

## 2021-12-19 ENCOUNTER — Other Ambulatory Visit: Payer: Self-pay

## 2021-12-19 ENCOUNTER — Encounter (HOSPITAL_COMMUNITY): Payer: Self-pay | Admitting: Obstetrics and Gynecology

## 2021-12-19 DIAGNOSIS — O021 Missed abortion: Secondary | ICD-10-CM | POA: Insufficient documentation

## 2021-12-19 DIAGNOSIS — O039 Complete or unspecified spontaneous abortion without complication: Secondary | ICD-10-CM

## 2021-12-19 DIAGNOSIS — Z3A10 10 weeks gestation of pregnancy: Secondary | ICD-10-CM | POA: Diagnosis not present

## 2021-12-19 DIAGNOSIS — O09291 Supervision of pregnancy with other poor reproductive or obstetric history, first trimester: Secondary | ICD-10-CM | POA: Diagnosis not present

## 2021-12-19 DIAGNOSIS — B379 Candidiasis, unspecified: Secondary | ICD-10-CM

## 2021-12-19 DIAGNOSIS — Z3A09 9 weeks gestation of pregnancy: Secondary | ICD-10-CM

## 2021-12-19 LAB — CBC WITH DIFFERENTIAL/PLATELET
Abs Immature Granulocytes: 0.04 10*3/uL (ref 0.00–0.07)
Basophils Absolute: 0 10*3/uL (ref 0.0–0.1)
Basophils Relative: 0 %
Eosinophils Absolute: 0.2 10*3/uL (ref 0.0–0.5)
Eosinophils Relative: 2 %
HCT: 37.9 % (ref 36.0–46.0)
Hemoglobin: 13.1 g/dL (ref 12.0–15.0)
Immature Granulocytes: 0 %
Lymphocytes Relative: 31 %
Lymphs Abs: 3 10*3/uL (ref 0.7–4.0)
MCH: 29.8 pg (ref 26.0–34.0)
MCHC: 34.6 g/dL (ref 30.0–36.0)
MCV: 86.3 fL (ref 80.0–100.0)
Monocytes Absolute: 0.4 10*3/uL (ref 0.1–1.0)
Monocytes Relative: 4 %
Neutro Abs: 6.1 10*3/uL (ref 1.7–7.7)
Neutrophils Relative %: 63 %
Platelets: 314 10*3/uL (ref 150–400)
RBC: 4.39 MIL/uL (ref 3.87–5.11)
RDW: 12.3 % (ref 11.5–15.5)
WBC: 9.8 10*3/uL (ref 4.0–10.5)
nRBC: 0 % (ref 0.0–0.2)

## 2021-12-19 LAB — TYPE AND SCREEN
ABO/RH(D): O POS
Antibody Screen: NEGATIVE

## 2021-12-19 MED ORDER — LACTATED RINGERS IV BOLUS
1000.0000 mL | Freq: Once | INTRAVENOUS | Status: AC
  Administered 2021-12-19: 1000 mL via INTRAVENOUS

## 2021-12-19 MED ORDER — KETOROLAC TROMETHAMINE 30 MG/ML IJ SOLN
30.0000 mg | Freq: Once | INTRAMUSCULAR | Status: AC
  Administered 2021-12-19: 30 mg via INTRAVENOUS
  Filled 2021-12-19: qty 1

## 2021-12-19 MED ORDER — TRAMADOL HCL 50 MG PO TABS
100.0000 mg | ORAL_TABLET | Freq: Once | ORAL | Status: AC
  Administered 2021-12-19: 100 mg via ORAL
  Filled 2021-12-19: qty 2

## 2021-12-19 MED ORDER — HYDROMORPHONE HCL 1 MG/ML IJ SOLN
1.0000 mg | Freq: Once | INTRAMUSCULAR | Status: DC
Start: 2021-12-19 — End: 2021-12-19

## 2021-12-19 MED ORDER — FLUCONAZOLE 150 MG PO TABS
150.0000 mg | ORAL_TABLET | Freq: Once | ORAL | Status: AC
  Administered 2021-12-19: 150 mg via ORAL
  Filled 2021-12-19: qty 1

## 2021-12-19 NOTE — MAU Note (Addendum)
.  Susan Krause is a 31 y.o. at [redacted]w[redacted]d here in MAU reporting: with c/o abdominal pain that is 10/10.  Pt states that the pain started around 1400 today and she took ibuprofen at 1700 and it is not effective.  Pt states that she had another clot that came out around noon today.  Pt reports that she is just having clots and no heavy vaginal bleeding.   ? ?Onset of complaint: 1700 ?Pain score: 10 ?Vitals:  ? 12/19/21 2101 12/19/21 2107  ?BP: (!) 135/92 114/86  ?Pulse: 100 (!) 103  ?Resp: 18   ?Temp: 97.9 ?F (36.6 ?C)   ?SpO2: 100%   ?   ? ?Lab orders placed from triage:    ?None  ?

## 2021-12-19 NOTE — Progress Notes (Signed)
?  S: Pt presents to MAU reporting severe cramping and passing blood clots today. Was diagnosed with completed SAB yesterday, 4/7. Previously Dx?s with missed AB measuring 6 weeks. US showed interval passage of GS.  ? ?Reports 10/10 low abdominal cramping that is worse than labor. No improvement with IBU. Last dose 1700. Denies fever, chills, dizziness, passage of tissue.  ? ?O:  ? ?MAU Course ?CBC w/did ?Type and Screen  ?NPO ?LR Bolus ?Toradol 30 mg IV ?Ultram 100 mg ? ?Discussed use of Cytotec to aid passage of any possible remaining tissue and clots. Pt worried about pain but declined any sedating pain meds. Prefers to wait for Results if pain meds and CBC.  ? ?Care of pt turned over to Warner Mccreedy at 1000.  ? ?Dorathy Kinsman CNM ?12/19/21 at 2200 ? ?I assumed care of patient's care. See MAU note for details. ? ?Warner Mccreedy, MD, MPH ?OB Fellow, Faculty Practice ? ?

## 2021-12-20 ENCOUNTER — Encounter: Payer: Self-pay | Admitting: Obstetrics and Gynecology

## 2021-12-20 DIAGNOSIS — O039 Complete or unspecified spontaneous abortion without complication: Secondary | ICD-10-CM

## 2021-12-20 MED ORDER — OXYCODONE HCL 5 MG PO TABS: 5.0000 mg | ORAL_TABLET | Freq: Four times a day (QID) | ORAL | 0 refills | Status: DC | PRN

## 2021-12-20 MED ORDER — IBUPROFEN 600 MG PO TABS: 600.0000 mg | ORAL_TABLET | Freq: Four times a day (QID) | ORAL | 0 refills | Status: DC

## 2021-12-20 MED ORDER — IBUPROFEN 800 MG PO TABS: 800.0000 mg | ORAL_TABLET | Freq: Three times a day (TID) | ORAL | 0 refills | Status: DC | PRN

## 2021-12-20 MED ORDER — CYCLOBENZAPRINE HCL 5 MG PO TABS
5.0000 mg | ORAL_TABLET | Freq: Three times a day (TID) | ORAL | 0 refills | Status: DC | PRN
Start: 2021-12-20 — End: 2022-07-06

## 2021-12-20 MED ORDER — ACETAMINOPHEN 325 MG PO TABS: 650.0000 mg | ORAL_TABLET | Freq: Four times a day (QID) | ORAL | 0 refills | Status: DC | PRN

## 2021-12-20 NOTE — Discharge Instructions (Signed)
You came to the MAU because of abdominal pain while you are having a miscarriage. We gave you medications which were helpful. We checked your labs which looked normal. We sent you pain medications for comfort while the rest of the pregnancy tissue passes ? ?Please seek medical care if you pass multiple large clots, soak through more than two pads in one hours time, or have dizziness, lightheadedness or feel like you are going to faint. ?

## 2021-12-20 NOTE — MAU Provider Note (Signed)
?History  ?  ? ?CSN: [id number] ? ?Arrival date and time: 12/19/21 2034 ? ? Event Date/Time  ? First Provider Initiated Contact with Patient 12/19/21 2123   ?  ? ?Chief Complaint  ?Patient presents with  ? Abdominal Pain  ? ?Abdominal Pain ?Pertinent negatives include no fever.  ? ? ? ?31 yo F G5P1021 at [redacted]w[redacted]d with known SAB presents with cramping and clots ? ?S: Pt presents to MAU reporting severe cramping and passing blood clots today. Was diagnosed with completed SAB yesterday, 4/7. Previously Dx?s with missed AB measuring 6 weeks. US showed interval passage of GS.  ? ?Reports 10/10 low abdominal cramping that is worse than labor. No improvement with IBU. Last dose 1700. Denies fever, chills, dizziness, passage of tissue.  ? ?Care of pt turned over to Warner Mccreedy at 1000.  ?OB History   ? ? Gravida  ?5  ? Para  ?1  ? Term  ?1  ? Preterm  ?   ? AB  ?2  ? Living  ?1  ?  ? ? SAB  ?1  ? IAB  ?   ? Ectopic  ?1  ? Multiple  ?0  ? Live Births  ?1  ?   ?  ?  ? ? ?Past Medical History:  ?Diagnosis Date  ? ADHD (attention deficit hyperactivity disorder)   ? No meds  ? Cervical dysplasia   ? Chlamydia   ? Head injury with loss of consciousness (HCC) 03/2020  ? Migraine   ? Propanolol 10 mg  ? OCD (obsessive compulsive disorder)   ? PTSD (post-traumatic stress disorder)   ? no meds  ? PVC (premature ventricular contraction)   ? PVC's (premature ventricular contractions) 2018  ? has palpations when she has PV's.  ? Sinus tachycardia   ? ? ?Past Surgical History:  ?Procedure Laterality Date  ? CESAREAN SECTION N/A 07/18/2018  ? Procedure: CESAREAN SECTION;  Surgeon: Essie Hart, MD;  Location: [facility] BIRTHING SUITES;  Service: Obstetrics;  Laterality: N/A;  ? DIAGNOSTIC LAPAROSCOPY WITH REMOVAL OF ECTOPIC PREGNANCY Left 12/20/2020  ? Procedure: LAPAROSCOPIC LEFT SALPINGECTOMY WITH REMOVAL OF ECTOPIC PREGNANCY;  Surgeon: [name], MD;  Location: [facility] OR;  Service: Gynecology;  Laterality: Left;  ? DILATION AND CURETTAGE OF UTERUS  N/A 07/01/2021  ? Procedure: DILATATION AND CURETTAGE WITH PATHOLOGY;  Surgeon: Milas Hock, MD;  Location: [facility] OR;  Service: Gynecology;  Laterality: N/A;  ? SALPINGECTOMY Left 12/2020  ? WISDOM TOOTH EXTRACTION    ? ? ?Family History  ?Problem Relation Age of Onset  ? ADD / ADHD Mother   ? Anxiety disorder Mother   ? Depression Mother   ? Hyperlipidemia Mother   ? Obesity Mother   ? ADD / ADHD Father   ? Anxiety disorder Sister   ? Asthma Sister   ? Depression Sister   ? Intellectual disability Brother   ? Learning disabilities Brother   ? ADD / ADHD Sister   ? Anxiety disorder Maternal Aunt   ? Diabetes Maternal Aunt   ? Alcohol abuse Maternal Uncle   ? Hyperlipidemia Maternal Uncle   ? Anxiety disorder Paternal Uncle   ? Diabetes Paternal Uncle   ? Anxiety disorder Maternal Grandmother   ? Diabetes Maternal Grandmother   ? Hyperlipidemia Maternal Grandmother   ? Varicose Veins Maternal Grandmother   ? Cancer Maternal Grandfather   ? Hyperlipidemia Maternal Grandfather   ? Obesity Maternal Grandfather   ? Vision loss Maternal Grandfather   ?  Cancer Paternal Grandmother   ? ? ?Social History  ? ?Tobacco Use  ? Smoking status: Never  ? Smokeless tobacco: Never  ?Vaping Use  ? Vaping Use: Never used  ?Substance Use Topics  ? Alcohol use: No  ? Drug use: No  ? ? ?Allergies:  ?Allergies  ?Allergen Reactions  ? Banana Swelling  ? Latex Swelling  ? Contrast Media [Iodinated Contrast Media] Palpitations  ? Gardasil [Hpv 4-Valent Vaccine Recombinant Vaccine]   ? Hpv Bival (Type 16,18) Recomb Vaccine  [Human Papillomavirus (16,18) Recomb Vac] Hives  ? Peanut Oil Swelling  ?  Tongue swells/itching  ? Pumpkin Flavor Swelling  ? Lactose Intolerance (Gi) Other (See Comments)  ?  Gi upset  ? ? ?No medications prior to admission.  ? ? ?Review of Systems  ?Constitutional:  Negative for fever.  ?Gastrointestinal:  Positive for abdominal pain.  ?All other systems reviewed and are negative. ?Physical Exam  ? ?Blood pressure 126/77,  pulse 74, temperature (!) 97.4 ?F (36.3 ?C), temperature source Oral, resp. rate 17, height 5\' 2"  (1.575 m), weight 85.2 kg, last menstrual period 10/11/2021, SpO2 100 %. ? ?Physical Exam ?Vitals and nursing note reviewed.  ?HENT:  ?   Head: Normocephalic.  ?Cardiovascular:  ?   Rate and Rhythm: Normal rate.  ?   Heart sounds: Normal heart sounds.  ?Neurological:  ?   Mental Status: She is alert.  ? ? ?MAU Course  ?Procedures ?None ? ?MDM ?Patient with known SAB with intermittent severe cramping pain. Labs drawn to assess for anemia and maintain active T&S. Give LR bolus, Toradol, and Ultram ?Discussed use of Cytotec to aid passage of any possible remaining tissue and clots. Pt worried about pain but declined any sedating pain meds. Prefers to wait for Results if pain meds and CBC.  ? ?At this time care was turned over to 10/13/2021, MD from Warner Mccreedy, CNM ? ?On reassessment ?Symptoms drastically improved. Patient desires to discharge home. CBC without any evidence of anemia and normal WBC. Patient discharged home with Pain medications (Oxycodone, Tylenol, and Ibuprofen) and return precautions. ? ?Assessment and Plan  ?SAB ?Previously thought to be missed AB and was scheduled for D&C. Then had passage of tissue and bleeding starting yesterday. Renee Rival showing passage of GS. D&C cancelled given now ongoing SAB. Now coming for pain control. Pain improved with Toradol and Ultram. Patient desires to go home and at this time does not want Cytotec.  ?- DC with Ibuprofen, Oxycodone ?- Discussed bleeding precautions as well as symptoms to watch for ? ?[place], MD, MPH ?OB Fellow, Faculty Practice ? ? ?

## 2021-12-21 ENCOUNTER — Ambulatory Visit (HOSPITAL_COMMUNITY)
Admission: RE | Admit: 2021-12-21 | Payer: Medicaid Other | Source: Home / Self Care | Admitting: Obstetrics & Gynecology

## 2021-12-21 DIAGNOSIS — O021 Missed abortion: Secondary | ICD-10-CM | POA: Diagnosis present

## 2021-12-21 SURGERY — DILATION AND EVACUATION, UTERUS
Anesthesia: Choice

## 2021-12-21 MED ORDER — FLUCONAZOLE 150 MG PO TABS: 150.0000 mg | ORAL_TABLET | ORAL | 3 refills | Status: DC

## 2021-12-21 NOTE — Addendum Note (Signed)
Addended by: Milas Hock A on: 12/21/2021 08:15 AM ? ? Modules accepted: Orders ? ?

## 2021-12-25 ENCOUNTER — Encounter: Payer: Medicaid Other | Admitting: Women's Health

## 2021-12-29 ENCOUNTER — Ambulatory Visit: Payer: Medicaid Other | Admitting: Family Medicine

## 2021-12-30 ENCOUNTER — Other Ambulatory Visit: Payer: Medicaid Other

## 2021-12-30 ENCOUNTER — Ambulatory Visit: Payer: Medicaid Other | Admitting: Family Medicine

## 2021-12-31 ENCOUNTER — Telehealth: Payer: Self-pay

## 2021-12-31 DIAGNOSIS — O039 Complete or unspecified spontaneous abortion without complication: Secondary | ICD-10-CM

## 2021-12-31 NOTE — Telephone Encounter (Signed)
CMA spoke to patient regariding Anora miscarriage test result. ?Patient is aware of the result and will send a mychart message to patient with [place] genetic counselor and MFM genetic counselor for an appointment. ? ?Francisco Capuchin, CMA  ?12/31/2021 ?

## 2021-12-31 NOTE — Progress Notes (Signed)
CMA scanned patient's Susan Krause test result. ?CMA called patient to inform on result, sent a mychart message for patient to schedule an appointment with MFM genetic counselor. ?Referral for MFM genetic has been placed. ? ?Felecia Shelling, CMA 12/31/2021. ?

## 2022-01-04 ENCOUNTER — Encounter: Payer: Self-pay | Admitting: Obstetrics and Gynecology

## 2022-01-04 DIAGNOSIS — N898 Other specified noninflammatory disorders of vagina: Secondary | ICD-10-CM

## 2022-01-04 MED ORDER — METRONIDAZOLE 0.75 % VA GEL: 1.0000 | Freq: Every day | VAGINAL | 5 refills | Status: DC

## 2022-01-05 ENCOUNTER — Other Ambulatory Visit: Payer: Medicaid Other

## 2022-01-05 DIAGNOSIS — Z3200 Encounter for pregnancy test, result unknown: Secondary | ICD-10-CM

## 2022-01-05 LAB — HCG, QUANTITATIVE, PREGNANCY: HCG, Total, QN: 7 m[IU]/mL

## 2022-01-05 NOTE — Progress Notes (Signed)
Pt here for BHCG only per Dr Para March ?

## 2022-01-06 ENCOUNTER — Ambulatory Visit: Payer: Medicaid Other | Attending: Physician Assistant

## 2022-01-06 ENCOUNTER — Ambulatory Visit: Payer: Self-pay | Admitting: Genetics

## 2022-01-06 ENCOUNTER — Ambulatory Visit: Payer: Medicaid Other

## 2022-01-06 DIAGNOSIS — Z315 Encounter for genetic counseling: Secondary | ICD-10-CM | POA: Diagnosis not present

## 2022-01-06 DIAGNOSIS — N96 Recurrent pregnancy loss: Secondary | ICD-10-CM

## 2022-01-06 DIAGNOSIS — Z1371 Encounter for nonprocreative screening for genetic disease carrier status: Secondary | ICD-10-CM

## 2022-01-06 DIAGNOSIS — O09291 Supervision of pregnancy with other poor reproductive or obstetric history, first trimester: Secondary | ICD-10-CM

## 2022-01-06 NOTE — Progress Notes (Signed)
?  Name: Susan Krause Indication: ?Previous pregnancy with Trisomy 8  ?DOB: April 02, 1991 Age: 31 y.o.   ?EDC: Not currently pregnant LMP: 10/11/2021 Referring Provider:  ?Arliss Journey, PA-C  ?EGA: Not currently pregnant Genetic Counselor: ?Teena Dunk, MS, CGC  ?OB Hx: [id number] Date of Appointment: 01/06/2022  ?Accompanied by:  ?Her reproductive partner, Therisa Doyne ?A [employer] GC Student Face to Face Time: 60 Minutes  ? ?Medical & Family History:  ?Susan Krause has a three year old son from a previous partner. Susan Krause has had two ectopic pregnancies and two miscarriages with Susan Krause. 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 ?Susan Krause reports one of her paternal half brothers has Autism and was born deaf. She reports one of her maternal half sisters was born with a cardiology problem involving the left ventricle and passed away after 22 days in the NICU. Susan Krause declined to discuss this family history further including potential genetic causes of Autism, deafness, and cardiology problems. Susan Krause shared that her primary focus is her most recent miscarriage with Trisomy 8. Genetic counseling is available to Susan Krause in the future if she wishes to discuss her family history in more detail.  ?Susan Krause's ethnicity reported as Caucasian and Susan Krause's ethnicity reported as African American. Denies consanguinity.  ?  ?Genetic Counseling:  ? ?Previous Pregnancy with Trisomy 8. Susan Krause's most recent miscarriage was found on Anora testing to be affected with Trisomy 8 (maternal), meaning an entire extra copy of the genetic information found on the 8th chromosome was detected and identified as having maternal origin. Genetic counseling reviewed with Susan Krause that 45% of first trimester miscarriages are found to have a chromosome abnormality. We reviewed that a trisomy most often occurs due to nondisjunction, or abnormal separation, of the chromosomes during production of the egg or  sperm. It may also occur as the result of a chromosome translocation in a parent, though this is less common. A translocation occurs when there is an exchange in chromosome material, such as when a segment of one chromosome breaks off and reattaches to a different chromosome. When the translocation is balanced, there is no extra or missing genetic material; however, an individual who carries a balanced translocation has an increased risk to pass unbalanced chromosomes to their offspring. In couples in which one partner is a carrier of a balanced translocation, this can result in an increased risk for infertility, recurrent pregnancy loss, and pregnancies/offspring with abnormal chromosomes. Without having a karyotype from the previous miscarriage, Trisomy 8 due to a translocation cannot be ruled out. We discussed that maternal chromosome analysis is available to determine if Susan Krause carries a balanced translocation given that the extra chromosome 8 genetic material was reported by Anora testing to be maternal in origin. If Susan Krause does not carry a balanced translocation, the recurrence risk for aneuploidy would be 1% or the maternal age-related risk. Because Susan Krause is only 31 years old, we would estimate a risk of 1%. Should she be greater than 18 years old at the time of any future pregnancy, then the risk would be equivalent to that of her age. ?  ?  ?Patient Plan: ? ?Proceed with: Chromosome analysis for Susan Krause ?Informed consent was obtained. All questions were answered. ?  ? ?Thank you for sharing in the care of Susan Krause with [place]. ?Please do not hesitate to contact us if you have any questions. ? ?Teena Dunk, MS, CGC ?Certified Genetic Counselor ? ?

## 2022-01-07 ENCOUNTER — Other Ambulatory Visit (INDEPENDENT_AMBULATORY_CARE_PROVIDER_SITE_OTHER): Payer: Medicaid Other

## 2022-01-07 DIAGNOSIS — Z3200 Encounter for pregnancy test, result unknown: Secondary | ICD-10-CM

## 2022-01-07 NOTE — Progress Notes (Signed)
Pt here for repeat BHCG per Dr.Duncan. Pt given lab slip and sent to lab.  ?

## 2022-01-11 ENCOUNTER — Other Ambulatory Visit: Payer: Self-pay | Admitting: *Deleted

## 2022-01-11 ENCOUNTER — Encounter: Payer: Self-pay | Admitting: Obstetrics and Gynecology

## 2022-01-11 DIAGNOSIS — O039 Complete or unspecified spontaneous abortion without complication: Secondary | ICD-10-CM

## 2022-01-12 ENCOUNTER — Other Ambulatory Visit: Payer: Self-pay | Admitting: *Deleted

## 2022-01-12 ENCOUNTER — Encounter: Payer: Self-pay | Admitting: Obstetrics and Gynecology

## 2022-01-12 LAB — HCG, QUANTITATIVE, PREGNANCY: HCG, Total, QN: <5 m[IU]/mL

## 2022-01-13 ENCOUNTER — Ambulatory Visit: Payer: Medicaid Other

## 2022-01-21 LAB — CHROMOSOME, BLOOD, ROUTINE
Cells Analyzed: 20
Cells Counted: 20
Cells Karyotyped: 2
GTG Band Resolution Achieved: 500

## 2022-01-22 ENCOUNTER — Telehealth: Payer: Self-pay | Admitting: Genetics

## 2022-01-22 NOTE — Telephone Encounter (Signed)
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 We discussed that the recurrence risk for aneuploidy in a future pregnancy would be approximately 1% at this time. ?

## 2022-06-24 ENCOUNTER — Other Ambulatory Visit (INDEPENDENT_AMBULATORY_CARE_PROVIDER_SITE_OTHER): Payer: BC Managed Care – PPO

## 2022-06-24 ENCOUNTER — Other Ambulatory Visit (HOSPITAL_COMMUNITY)
Admission: RE | Admit: 2022-06-24 | Discharge: 2022-06-24 | Disposition: A | Discharge status: Home / Self Care | Payer: BC Managed Care – PPO | Source: Ambulatory Visit | Attending: Obstetrics and Gynecology | Admitting: Obstetrics and Gynecology

## 2022-06-24 DIAGNOSIS — N898 Other specified noninflammatory disorders of vagina: Secondary | ICD-10-CM | POA: Diagnosis not present

## 2022-06-24 DIAGNOSIS — Z113 Encounter for screening for infections with a predominantly sexual mode of transmission: Secondary | ICD-10-CM | POA: Insufficient documentation

## 2022-06-24 NOTE — Progress Notes (Signed)
SUBJECTIVE:  31 y.o. female desires STI screening.  Denies abnormal vaginal bleeding or significant pelvic pain or fever. No UTI symptoms.  Patient's last menstrual period was 06/14/2022 (exact date).  OBJECTIVE:  She appears well, afebrile. Urine dipstick: not done.  ASSESSMENT:  Screening for STI Vaginal Irritation    PLAN:  GC, chlamydia, trichomonas, BVAG, CVAG probe sent to lab. Treatment: To be determined once lab results are received ROV prn if symptoms persist or worsen.

## 2022-06-25 LAB — CERVICOVAGINAL ANCILLARY ONLY
Bacterial Vaginitis (gardnerella): NEGATIVE
Candida Glabrata: NEGATIVE
Candida Vaginitis: POSITIVE — AB
Chlamydia: NEGATIVE
Comment: NEGATIVE
Comment: NEGATIVE
Comment: NEGATIVE
Comment: NEGATIVE
Comment: NEGATIVE
Comment: NORMAL
Neisseria Gonorrhea: NEGATIVE
Trichomonas: NEGATIVE

## 2022-06-27 ENCOUNTER — Other Ambulatory Visit: Payer: Self-pay | Admitting: Obstetrics and Gynecology

## 2022-06-27 DIAGNOSIS — B3731 Acute candidiasis of vulva and vagina: Secondary | ICD-10-CM

## 2022-06-27 MED ORDER — FLUCONAZOLE 150 MG PO TABS: 150.0000 mg | ORAL_TABLET | ORAL | 3 refills | Status: DC

## 2022-06-29 ENCOUNTER — Other Ambulatory Visit: Payer: Self-pay | Admitting: Obstetrics and Gynecology

## 2022-06-29 DIAGNOSIS — B3731 Acute candidiasis of vulva and vagina: Secondary | ICD-10-CM

## 2022-07-02 ENCOUNTER — Telehealth: Payer: Self-pay | Admitting: *Deleted

## 2022-07-02 MED ORDER — FLUCONAZOLE 100 MG PO TABS: ORAL_TABLET | ORAL | 0 refills | Status: DC

## 2022-07-02 NOTE — Telephone Encounter (Cosign Needed)
Pharmacy called and could only give the pt 1 Diflucan instead of 3 due to a backorder.  RX changed to 100 mg tablets for the last 2 doses.

## 2022-07-06 ENCOUNTER — Ambulatory Visit (INDEPENDENT_AMBULATORY_CARE_PROVIDER_SITE_OTHER): Payer: BC Managed Care – PPO | Admitting: Obstetrics and Gynecology

## 2022-07-06 ENCOUNTER — Encounter: Payer: Self-pay | Admitting: Obstetrics and Gynecology

## 2022-07-06 ENCOUNTER — Other Ambulatory Visit (HOSPITAL_COMMUNITY)
Admission: RE | Admit: 2022-07-06 | Discharge: 2022-07-06 | Disposition: A | Discharge status: Home / Self Care | Payer: BC Managed Care – PPO | Source: Ambulatory Visit | Attending: Obstetrics and Gynecology | Admitting: Obstetrics and Gynecology

## 2022-07-06 VITALS — BP 134/88 | HR 78 | Ht 61.0 in | Wt 191.0 lb

## 2022-07-06 DIAGNOSIS — N898 Other specified noninflammatory disorders of vagina: Secondary | ICD-10-CM

## 2022-07-06 DIAGNOSIS — Z113 Encounter for screening for infections with a predominantly sexual mode of transmission: Secondary | ICD-10-CM | POA: Insufficient documentation

## 2022-07-06 NOTE — Progress Notes (Signed)
GYNECOLOGY  ENCOUNTER NOTE  History:     Susan Krause is a 31 y.o. [phone] female here with complaints of spotting. She is concerned she may be pregnant or possibly of an STI. She reports odor to her discharge last week only.   She reports on Saturday she started spotting and spotted for 3 days. Has been taking metronitazole since Thursday and now she has no odor.  Partner cheated on her and she would like to be tested for all STI's.   Gynecologic History Patient's last menstrual period was 06/14/2022 (exact date). Contraception: none   Obstetric History OB History  Gravida Para Term Preterm AB Living  5 1 1   2 1   SAB IAB Ectopic Multiple Live Births  1   1 0 1    # Outcome Date GA Lbr Len/2nd Weight Sex Delivery Anes PTL Lv  5 Gravida           4 SAB 05/2021          3 Ectopic 12/20/20 [redacted]w[redacted]d         2 Term 07/18/18 [redacted]w[redacted]d / 04:17 8 lb 3 oz (3.715 kg) M CS-Vac EPI  LIV  1 Gravida             Past Medical History:  Diagnosis Date   ADHD (attention deficit hyperactivity disorder)    No meds   Cervical dysplasia    Chlamydia    Head injury with loss of consciousness (HCC) 03/2020   Migraine    Propanolol 10 mg   OCD (obsessive compulsive disorder)    PTSD (post-traumatic stress disorder)    no meds   PVC (premature ventricular contraction)    PVC's (premature ventricular contractions) 2018   has palpations when she has PV's.   Sinus tachycardia     Past Surgical History:  Procedure Laterality Date   CESAREAN SECTION N/A 07/18/2018   Procedure: CESAREAN SECTION;  Surgeon: 13/01/2018, MD;  Location: [facility] BIRTHING SUITES;  Service: Obstetrics;  Laterality: N/A;   DIAGNOSTIC LAPAROSCOPY WITH REMOVAL OF ECTOPIC PREGNANCY Left 12/20/2020   Procedure: LAPAROSCOPIC LEFT SALPINGECTOMY WITH REMOVAL OF ECTOPIC PREGNANCY;  Surgeon: 02/19/2021, MD;  Location: [facility] OR;  Service: Gynecology;  Laterality: Left;   DILATION AND CURETTAGE OF UTERUS N/A 07/01/2021   Procedure:  DILATATION AND CURETTAGE WITH PATHOLOGY;  Surgeon: 07/03/2021, MD;  Location: [facility] OR;  Service: Gynecology;  Laterality: N/A;   SALPINGECTOMY Left 12/2020   WISDOM TOOTH EXTRACTION      Current Outpatient Medications on File Prior to Visit  Medication Sig Dispense Refill   labetalol (NORMODYNE) 100 MG tablet Take 100 mg by mouth once.     amitriptyline (ELAVIL) 10 MG tablet Take 1-2 tablets by mouth at bedtime. (Patient not taking: Reported on 07/06/2022)     ibuprofen (ADVIL) 600 MG tablet Take 1 tablet (600 mg total) by mouth every 6 (six) hours. (Patient not taking: Reported on 06/24/2022) 60 tablet 0   No current facility-administered medications on file prior to visit.    Allergies  Allergen Reactions   Banana Swelling   Latex Swelling   Contrast Media [Iodinated Contrast Media] Palpitations   Gardasil [Hpv 4-Valent Vaccine Recombinant Vaccine]    Hpv Bival (Type 16,18) Recomb Vaccine  [Human Papillomavirus (16,18) Recomb Vac] Hives   Peanut Oil Swelling    Tongue swells/itching   Pumpkin Flavor Swelling   Lactose Intolerance (Gi) Other (See Comments)    Gi upset  Social History:  reports that she has never smoked. She has never used smokeless tobacco. She reports that she does not drink alcohol and does not use drugs.  Family History  Problem Relation Age of Onset   ADD / ADHD Mother    Anxiety disorder Mother    Depression Mother    Hyperlipidemia Mother    Obesity Mother    ADD / ADHD Father    Anxiety disorder Sister    Asthma Sister    Depression Sister    Intellectual disability Brother    Learning disabilities Brother    ADD / ADHD Sister    Anxiety disorder Maternal Aunt    Diabetes Maternal Aunt    Alcohol abuse Maternal Uncle    Hyperlipidemia Maternal Uncle    Anxiety disorder Paternal Uncle    Diabetes Paternal Uncle    Anxiety disorder Maternal Grandmother    Diabetes Maternal Grandmother    Hyperlipidemia Maternal Grandmother    Varicose  Veins Maternal Grandmother    Cancer Maternal Grandfather    Hyperlipidemia Maternal Grandfather    Obesity Maternal Grandfather    Vision loss Maternal Grandfather    Cancer Paternal Grandmother     The following portions of the patient's history were reviewed and updated as appropriate: allergies, current medications, past family history, past medical history, past social history, past surgical history and problem list.  Review of Systems Pertinent items noted in HPI and remainder of comprehensive ROS otherwise negative.  Physical Exam:  BP 134/88   Pulse 78   Ht 5\' 1"  (1.549 m)   Wt 191 lb (86.6 kg)   LMP 06/14/2022 (Exact Date)   BMI 36.09 kg/m  CONSTITUTIONAL: Well-developed, well-nourished female in no acute distress.  HENT:  Normocephalic,  EYES: Conjunctivae and EOM are normal. MUSCULOSKELETAL: Normal range of motion.  NEUROLOGIC: Alert and oriented to person, place, and time.  PSYCHIATRIC: Normal mood and affect. ABDOMEN: Soft, no distention noted.  No tenderness, rebound or guarding.  PELVIC: Normal appearing external genitalia and urethral meatus, vaginal swabs collected.    Assessment and Plan:    1. Routine screening for STI (sexually transmitted infection)  - Cervicovaginal ancillary only( [place])  2. Vaginal discharge  - Cervicovaginal ancillary only( [place])    Gaynell Eggleton, Artist Pais, [place] for [employer], [place]

## 2022-07-07 LAB — CERVICOVAGINAL ANCILLARY ONLY
Bacterial Vaginitis (gardnerella): POSITIVE — AB
Candida Glabrata: NEGATIVE
Candida Vaginitis: NEGATIVE
Chlamydia: NEGATIVE
Comment: NEGATIVE
Comment: NEGATIVE
Comment: NEGATIVE
Comment: NEGATIVE
Comment: NEGATIVE
Comment: NORMAL
Neisseria Gonorrhea: NEGATIVE
Trichomonas: NEGATIVE

## 2022-07-08 ENCOUNTER — Other Ambulatory Visit: Payer: Self-pay | Admitting: Obstetrics and Gynecology

## 2022-07-08 MED ORDER — METRONIDAZOLE 500 MG PO TABS: 500.0000 mg | ORAL_TABLET | Freq: Two times a day (BID) | ORAL | 0 refills | Status: AC

## 2022-07-08 NOTE — Progress Notes (Signed)
+   BV on wet prep Will sent in Flagyl Rx  Gunner Iodice, Artist Pais, NP 07/08/2022 1:29 PM

## 2022-07-09 ENCOUNTER — Encounter: Payer: Self-pay | Admitting: Obstetrics and Gynecology

## 2022-07-12 ENCOUNTER — Ambulatory Visit (INDEPENDENT_AMBULATORY_CARE_PROVIDER_SITE_OTHER): Payer: BC Managed Care – PPO

## 2022-07-12 DIAGNOSIS — Z3201 Encounter for pregnancy test, result positive: Secondary | ICD-10-CM

## 2022-07-12 DIAGNOSIS — Z3200 Encounter for pregnancy test, result unknown: Secondary | ICD-10-CM

## 2022-07-12 LAB — POCT URINE PREGNANCY: Preg Test, Ur: POSITIVE — AB

## 2022-07-12 NOTE — Progress Notes (Signed)
Pt here for UPT and BHCG. UPT positive. LMP 06/14/22. Pt is aware Dr.Duncan will contact with results.

## 2022-07-13 LAB — HCG, QUANTITATIVE, PREGNANCY: HCG, Total, QN: 166 m[IU]/mL

## 2022-07-13 NOTE — Progress Notes (Unsigned)
GYNECOLOGY VIRTUAL VISIT ENCOUNTER NOTE  Provider location: Center for [place] at [place]   Patient location: Home  I connected with Susan Krause on 07/14/22 at 10:30 AM EDT by MyChart Video Encounter and verified that I am speaking with the correct person using two identifiers.   I discussed the limitations, risks, security and privacy concerns of performing an evaluation and management service virtually and the availability of in person appointments. I also discussed with the patient that there may be a patient responsible charge related to this service. The patient expressed understanding and agreed to proceed.   History:  Susan Krause is a 31 y.o. [phone] female being evaluated today for pregnancy of unknown location but she is too early for [place] to determine this. Initial HCG on 10/30 was 166. HCG from today is pending.   She has some left and sometimes right side pain but they ultimately resolve. Given her history this makes her nervous. Felt nausea this morning. Then improved.    She is unsure of paternity. Ovulated on the 16th or 17th - intercourse with other partner on the 14th and intercourse with Randall Hiss, main partner, on 15th and after.     Past Medical History:  Diagnosis Date   ADHD (attention deficit hyperactivity disorder)    No meds   Cervical dysplasia    Chlamydia    Head injury with loss of consciousness ([place]) 03/2020   Migraine    Propanolol 10 mg   OCD (obsessive compulsive disorder)    PTSD (post-traumatic stress disorder)    no meds   PVC (premature ventricular contraction)    PVC's (premature ventricular contractions) 2018   has palpations when she has PV's.   Sinus tachycardia    Past Surgical History:  Procedure Laterality Date   CESAREAN SECTION N/A 07/18/2018   Procedure: CESAREAN SECTION;  Surgeon: Sanjuana Kava, MD;  Location: [place];  Service: Obstetrics;  Laterality: N/A;   DIAGNOSTIC LAPAROSCOPY WITH REMOVAL OF ECTOPIC  PREGNANCY Left 12/20/2020   Procedure: LAPAROSCOPIC LEFT SALPINGECTOMY WITH REMOVAL OF ECTOPIC PREGNANCY;  Surgeon: Donnamae Jude, MD;  Location: [place];  Service: Gynecology;  Laterality: Left;   DILATION AND CURETTAGE OF UTERUS N/A 07/01/2021   Procedure: DILATATION AND CURETTAGE WITH PATHOLOGY;  Surgeon: Radene Gunning, MD;  Location: [place];  Service: Gynecology;  Laterality: N/A;   SALPINGECTOMY Left 12/2020   WISDOM TOOTH EXTRACTION     The following portions of the patient's history were reviewed and updated as appropriate: allergies, current medications, past family history, past medical history, past social history, past surgical history and problem list.   Health Maintenance:  Normal pap and negative HRHPV on 01/22/2021.    Review of Systems:  Pertinent items noted in HPI and remainder of comprehensive ROS otherwise negative.  Physical Exam:   General:  Alert, oriented and cooperative. Patient appears to be in no acute distress.  Mental Status: Normal mood and affect. Normal behavior. Normal judgment and thought content.   Respiratory: Normal respiratory effort, no problems with respiration noted  Rest of physical exam deferred due to type of encounter  Labs and Imaging Results for orders placed or performed in visit on 07/12/22 (from the past 336 hour(s))  POCT urine pregnancy   Collection Time: 07/12/22  8:43 AM  Result Value Ref Range   Preg Test, Ur Positive (A) Negative  B-HCG Quant   Collection Time: 07/12/22  8:50 AM  Result Value Ref Range   HCG, Total,  QN 166 mIU/mL  Results for orders placed or performed in visit on 07/06/22 (from the past 336 hour(s))  Cervicovaginal ancillary only( [place])   Collection Time: 07/06/22  2:48 PM  Result Value Ref Range   Neisseria Gonorrhea Negative    Chlamydia Negative    Trichomonas Negative    Bacterial Vaginitis (gardnerella) Positive (A)    Candida Vaginitis Negative    Candida Glabrata Negative    Comment       Normal Reference Range Bacterial Vaginosis - Negative   Comment Normal Reference Range Candida Species - Negative    Comment Normal Reference Range Candida Galbrata - Negative    Comment Normal Reference Range Trichomonas - Negative    Comment Normal Reference [place] Chlamydia - Negative    Comment      Normal Reference Range Neisseria Gonorrhea - Negative   No results found.     Assessment and Plan:  Diagnoses and all orders for this visit:  Pregnancy of unknown anatomic location  - Serial betas until high enough that [place] can first be done to confirm location then viability once location is confirmed IUP. -  Reviewed no available test from our end to test paternity.  - LMP is sure - November 6th would be 5 weeks so we will do [place] next week mid-week.   I discussed the assessment and treatment plan with the patient. The patient was provided an opportunity to ask questions and all were answered. The patient agreed with the plan and demonstrated an understanding of the instructions.   The patient was advised to call back or seek an in-person evaluation/go to the ED if the symptoms worsen or if the condition fails to improve as anticipated.  I provided 12 minutes of face-to-face time during this encounter.   Milas Hock, MD Center for [facility], [facility]

## 2022-07-14 ENCOUNTER — Encounter: Payer: Self-pay | Admitting: Obstetrics and Gynecology

## 2022-07-14 ENCOUNTER — Other Ambulatory Visit: Payer: BC Managed Care – PPO

## 2022-07-14 ENCOUNTER — Telehealth (INDEPENDENT_AMBULATORY_CARE_PROVIDER_SITE_OTHER): Payer: BC Managed Care – PPO | Admitting: Obstetrics and Gynecology

## 2022-07-14 DIAGNOSIS — O3680X0 Pregnancy with inconclusive fetal viability, not applicable or unspecified: Secondary | ICD-10-CM | POA: Diagnosis not present

## 2022-07-14 DIAGNOSIS — Z3200 Encounter for pregnancy test, result unknown: Secondary | ICD-10-CM

## 2022-07-14 NOTE — Progress Notes (Signed)
BHCG only.

## 2022-07-15 ENCOUNTER — Encounter: Payer: Self-pay | Admitting: Obstetrics and Gynecology

## 2022-07-15 LAB — HCG, QUANTITATIVE, PREGNANCY: HCG, Total, QN: 402 m[IU]/mL

## 2022-07-16 ENCOUNTER — Other Ambulatory Visit: Payer: BC Managed Care – PPO

## 2022-07-16 DIAGNOSIS — O3680X0 Pregnancy with inconclusive fetal viability, not applicable or unspecified: Secondary | ICD-10-CM

## 2022-07-16 NOTE — Progress Notes (Signed)
Pt here for serial BHCG's

## 2022-07-17 LAB — HCG, QUANTITATIVE, PREGNANCY: HCG, Total, QN: 1049 m[IU]/mL

## 2022-07-19 ENCOUNTER — Ambulatory Visit (INDEPENDENT_AMBULATORY_CARE_PROVIDER_SITE_OTHER): Payer: BC Managed Care – PPO

## 2022-07-19 ENCOUNTER — Other Ambulatory Visit: Payer: Self-pay | Admitting: *Deleted

## 2022-07-19 DIAGNOSIS — Z3A01 Less than 8 weeks gestation of pregnancy: Secondary | ICD-10-CM | POA: Diagnosis not present

## 2022-07-19 DIAGNOSIS — O3680X0 Pregnancy with inconclusive fetal viability, not applicable or unspecified: Secondary | ICD-10-CM | POA: Diagnosis not present

## 2022-07-21 ENCOUNTER — Other Ambulatory Visit: Payer: Self-pay | Admitting: *Deleted

## 2022-07-21 DIAGNOSIS — O3680X0 Pregnancy with inconclusive fetal viability, not applicable or unspecified: Secondary | ICD-10-CM

## 2022-07-29 ENCOUNTER — Telehealth: Payer: Self-pay | Admitting: *Deleted

## 2022-07-29 MED ORDER — PROMETHAZINE HCL 25 MG PO TABS: 25.0000 mg | ORAL_TABLET | Freq: Four times a day (QID) | ORAL | 1 refills | Status: DC | PRN

## 2022-07-29 NOTE — Telephone Encounter (Cosign Needed)
Pt called stating that she has diarrhea and severe nausea.  She stated she is neg for FLU and Covid.  Recommended Immodium for diarrhea and per Dr Para March she may have Phenergan 25 mg PO.  This was sent to [facility].  Pt instructed if she continue to have issues and cannot keep anything down she is going to need possible IV hydration fluids.

## 2022-07-30 ENCOUNTER — Encounter: Payer: Self-pay | Admitting: Obstetrics and Gynecology

## 2022-08-01 ENCOUNTER — Inpatient Hospital Stay (HOSPITAL_COMMUNITY): Payer: BC Managed Care – PPO

## 2022-08-01 ENCOUNTER — Encounter (HOSPITAL_COMMUNITY): Payer: Self-pay | Admitting: *Deleted

## 2022-08-01 ENCOUNTER — Inpatient Hospital Stay (HOSPITAL_COMMUNITY)
Admission: AD | Admit: 2022-08-01 | Discharge: 2022-08-01 | Disposition: A | Discharge status: Home / Self Care | Payer: BC Managed Care – PPO | Attending: Obstetrics & Gynecology | Admitting: Obstetrics & Gynecology

## 2022-08-01 DIAGNOSIS — O99891 Other specified diseases and conditions complicating pregnancy: Secondary | ICD-10-CM | POA: Insufficient documentation

## 2022-08-01 DIAGNOSIS — Z3490 Encounter for supervision of normal pregnancy, unspecified, unspecified trimester: Secondary | ICD-10-CM

## 2022-08-01 DIAGNOSIS — O418X10 Other specified disorders of amniotic fluid and membranes, first trimester, not applicable or unspecified: Secondary | ICD-10-CM | POA: Diagnosis not present

## 2022-08-01 DIAGNOSIS — O26891 Other specified pregnancy related conditions, first trimester: Secondary | ICD-10-CM | POA: Diagnosis not present

## 2022-08-01 DIAGNOSIS — Z3687 Encounter for antenatal screening for uncertain dates: Secondary | ICD-10-CM | POA: Diagnosis not present

## 2022-08-01 DIAGNOSIS — Z3A01 Less than 8 weeks gestation of pregnancy: Secondary | ICD-10-CM | POA: Diagnosis not present

## 2022-08-01 DIAGNOSIS — O208 Other hemorrhage in early pregnancy: Secondary | ICD-10-CM | POA: Insufficient documentation

## 2022-08-01 DIAGNOSIS — M545 Low back pain, unspecified: Secondary | ICD-10-CM | POA: Diagnosis not present

## 2022-08-01 DIAGNOSIS — O468X1 Other antepartum hemorrhage, first trimester: Secondary | ICD-10-CM

## 2022-08-01 DIAGNOSIS — O09291 Supervision of pregnancy with other poor reproductive or obstetric history, first trimester: Secondary | ICD-10-CM | POA: Insufficient documentation

## 2022-08-01 LAB — CBC
HCT: 35.8 % — ABNORMAL LOW (ref 36.0–46.0)
Hemoglobin: 12.6 g/dL (ref 12.0–15.0)
MCH: 29.6 pg (ref 26.0–34.0)
MCHC: 35.2 g/dL (ref 30.0–36.0)
MCV: 84 fL (ref 80.0–100.0)
Platelets: 280 K/uL (ref 150–400)
RBC: 4.26 MIL/uL (ref 3.87–5.11)
RDW: 12 % (ref 11.5–15.5)
WBC: 8.2 K/uL (ref 4.0–10.5)
nRBC: 0 % (ref 0.0–0.2)

## 2022-08-01 NOTE — MAU Provider Note (Signed)
History     CSN: [id number]  Arrival date and time: 08/01/22 1119   Event Date/Time   First Provider Initiated Contact with Patient 08/01/22 1307      Chief Complaint  Patient presents with   Back Pain   Vaginal Bleeding   HPI Susan Krause is a 31 y.o. [id number] at [redacted]w[redacted]d who presents to MAU with chief complaint of vaginal spotting and low back pain. These are new problems, onset last night. Pain is not present on arrival to MAU. Patient's spotting is pink-tinged. She denies abdominal pain, dysuria, or fever. She is remote from sexual intercourse.  Patient is s/p evaluation with [facility]. She is scheduled for viability scan tomorrow 08/02/2022.  OB History     Gravida  6   Para  1   Term  1   Preterm      AB  4   Living  1      SAB  2   IAB      Ectopic  2   Multiple  0   Live Births  1           Past Medical History:  Diagnosis Date   ADHD (attention deficit hyperactivity disorder)    No meds   Cervical dysplasia    Chlamydia    Head injury with loss of consciousness (HCC) 03/2020   Migraine    Propanolol 10 mg   OCD (obsessive compulsive disorder)    PTSD (post-traumatic stress disorder)    no meds   PVC (premature ventricular contraction)    PVC's (premature ventricular contractions) 2018   has palpations when she has PV's.   Sinus tachycardia     Past Surgical History:  Procedure Laterality Date   CESAREAN SECTION N/A 07/18/2018   Procedure: CESAREAN SECTION;  Surgeon: Essie Hart, MD;  Location: [facility];  Service: Obstetrics;  Laterality: N/A;   DIAGNOSTIC LAPAROSCOPY WITH REMOVAL OF ECTOPIC PREGNANCY Left 12/20/2020   Procedure: LAPAROSCOPIC LEFT SALPINGECTOMY WITH REMOVAL OF ECTOPIC PREGNANCY;  Surgeon: [name], MD;  Location: [facility] OR;  Service: Gynecology;  Laterality: Left;   DILATION AND CURETTAGE OF UTERUS N/A 07/01/2021   Procedure: DILATATION AND CURETTAGE WITH PATHOLOGY;  Surgeon: Milas Hock, MD;  Location: [facility] OR;   Service: Gynecology;  Laterality: N/A;   SALPINGECTOMY Left 12/2020   WISDOM TOOTH EXTRACTION      Family History  Problem Relation Age of Onset   ADD / ADHD Mother    Anxiety disorder Mother    Depression Mother    Hyperlipidemia Mother    Obesity Mother    ADD / ADHD Father    Anxiety disorder Sister    Asthma Sister    Depression Sister    Intellectual disability Brother    Learning disabilities Brother    ADD / ADHD Sister    Anxiety disorder Maternal Aunt    Diabetes Maternal Aunt    Alcohol abuse Maternal Uncle    Hyperlipidemia Maternal Uncle    Anxiety disorder Paternal Uncle    Diabetes Paternal Uncle    Anxiety disorder Maternal Grandmother    Diabetes Maternal Grandmother    Hyperlipidemia Maternal Grandmother    Varicose Veins Maternal Grandmother    Cancer Maternal Grandfather    Hyperlipidemia Maternal Grandfather    Obesity Maternal Grandfather    Vision loss Maternal Grandfather    Cancer Paternal Grandmother     Social History   Tobacco Use   Smoking status: Never  Smokeless tobacco: Never  Vaping Use   Vaping Use: Never used  Substance Use Topics   Alcohol use: No   Drug use: No    Allergies:  Allergies  Allergen Reactions   Banana Swelling   Latex Swelling   Contrast Media [Iodinated Contrast Media] Palpitations   Gardasil [Hpv 4-Valent Vaccine Recombinant Vaccine]    Hpv Bival (Type 16,18) Recomb Vaccine  [Human Papillomavirus (16,18) Recomb Vac] Hives   Peanut Oil Swelling    Tongue swells/itching   Pumpkin Flavor Swelling   Lactose Intolerance (Gi) Other (See Comments)    Gi upset    Medications Prior to Admission  Medication Sig Dispense Refill Last Dose   labetalol (NORMODYNE) 100 MG tablet Take 100 mg by mouth once.      promethazine (PHENERGAN) 25 MG tablet Take 1 tablet (25 mg total) by mouth every 6 (six) hours as needed for nausea or vomiting. 30 tablet 1     Review of Systems  Genitourinary:  Positive for vaginal  bleeding.  Musculoskeletal:  Positive for back pain.  All other systems reviewed and are negative.  Physical Exam   Blood pressure (!) 139/90, pulse 86, temperature 98.4 F (36.9 C), temperature source Oral, resp. rate 18, height 5\' 1"  (1.549 m), weight 87.9 kg, last menstrual period 06/14/2022, SpO2 98 %.  Physical Exam Vitals and nursing note reviewed. Exam conducted with a chaperone present.  Constitutional:      Appearance: She is not ill-appearing.  Cardiovascular:     Rate and Rhythm: Normal rate.  Pulmonary:     Effort: Pulmonary effort is normal.  Skin:    Capillary Refill: Capillary refill takes less than 2 seconds.  Neurological:     Mental Status: She is alert and oriented to person, place, and time.  Psychiatric:        Mood and Affect: Mood normal.        Behavior: Behavior normal.        Thought Content: Thought content normal.        Judgment: Judgment normal.     MAU Course  Procedures  MDM  Patient Vitals for the past 24 hrs:  BP Temp Temp src Pulse Resp SpO2 Height Weight  08/01/22 1320 (!) 140/92 -- -- 88 17 -- -- --  08/01/22 1140 (!) 139/90 98.4 F (36.9 C) Oral 86 18 98 % 5\' 1"  (1.549 m) 87.9 kg    Orders Placed This Encounter  Procedures   08/03/22 OB LESS THAN 14 WEEKS WITH OB TRANSVAGINAL   CBC   Apply warm compress   Discharge patient   Results for orders placed or performed during the hospital encounter of 08/01/22 (from the past 24 hour(s))  CBC     Status: Abnormal   Collection Time: 08/01/22 12:24 PM  Result Value Ref Range   WBC 8.2 4.0 - 10.5 K/uL   RBC 4.26 3.87 - 5.11 MIL/uL   Hemoglobin 12.6 12.0 - 15.0 g/dL   HCT 08/03/22 (L) 08/03/22 - 78.2 %   MCV 84.0 80.0 - 100.0 fL   MCH 29.6 26.0 - 34.0 pg   MCHC 35.2 30.0 - 36.0 g/dL   RDW 95.6 21.3 - 08.6 %   Platelets 280 150 - 400 K/uL   nRBC 0.0 0.0 - 0.2 %   57.8 OB LESS THAN 14 WEEKS WITH OB TRANSVAGINAL  Result Date: 08/01/2022 CLINICAL DATA:  Vaginal spotting in first trimester  pregnancy EXAM: OBSTETRIC <14 WK [place] AND TRANSVAGINAL OB 08/03/2022  TECHNIQUE: Both transabdominal and transvaginal ultrasound examinations were performed for complete evaluation of the gestation as well as the maternal uterus, adnexal regions, and pelvic cul-de-sac. Transvaginal technique was performed to assess early pregnancy. COMPARISON:  07/19/2022 FINDINGS: Intrauterine gestational sac: Single Yolk sac:  Seen Embryo:  Seen Cardiac Activity: Seen Heart Rate: 145 bpm CRL:  8.2 mm   6 w   5 d                  [place] EDC: 03/22/2023 Subchorionic hemorrhage: There is 1.9 x 0.5 cm subchorionic bleed adjacent to the gestational sac. Maternal uterus/adnexae: Cervix is closed. There are no adnexal masses. There is no free fluid in pelvis. IMPRESSION: Single live intrauterine pregnancy seen. Sonographically estimated gestational age is 6 weeks 5 days. Small subchorionic bleed is noted adjacent to the gestational sac. Uterine cervix is closed. Electronically Signed   By: Ernie Avena M.D.   On: 08/01/2022 13:03     Assessment and Plan  --31 y.o. [id number] with SIUP measuring 6w 5d --Subchorionic Hematoma, pelvic rest advised --Hgb 12.6 --Discharge home in stable condition with first trimester precautions  Calvert Cantor, MSA, MSN, CNM 08/01/2022, 3:18 PM

## 2022-08-01 NOTE — MAU Note (Signed)
Susan Krause is a 31 y.o. at Unknown here in MAU reporting: last night saw spotting when she wiped.  Talked to on call nurse, due to her hx of miscarriages and ectopics, they told her to come in. Has had some lower back pain, comes and goes.  Light spotting some times, pinkish.   Onset of complaint: last night Pain score: none Vitals:   08/01/22 1140  BP: (!) 139/90  Pulse: 86  Resp: 18  Temp: 98.4 F (36.9 C)  SpO2: 98%      Lab orders placed from triage:  none

## 2022-08-02 ENCOUNTER — Other Ambulatory Visit: Payer: BC Managed Care – PPO

## 2022-08-18 ENCOUNTER — Telehealth: Payer: Self-pay

## 2022-08-18 NOTE — Telephone Encounter (Signed)
Pt called stating she has abdominal pain that she feels occasionally that moves from the right side, middle and left side. Pt describes pain as "not very painful" and scores it as a 3/10. Pt was told mild abdominal pain can be normal in pregnancy. Pt denies bleeding. Pt was told to monitor it and if it worsens or she begins to have bleeding then she should go to MAU.

## 2022-08-23 ENCOUNTER — Ambulatory Visit: Payer: BC Managed Care – PPO

## 2022-08-23 ENCOUNTER — Other Ambulatory Visit (HOSPITAL_COMMUNITY)
Admission: RE | Admit: 2022-08-23 | Discharge: 2022-08-23 | Disposition: A | Discharge status: Home / Self Care | Payer: BC Managed Care – PPO | Source: Ambulatory Visit | Attending: Obstetrics and Gynecology | Admitting: Obstetrics and Gynecology

## 2022-08-23 ENCOUNTER — Ambulatory Visit (INDEPENDENT_AMBULATORY_CARE_PROVIDER_SITE_OTHER): Payer: BC Managed Care – PPO | Admitting: Obstetrics and Gynecology

## 2022-08-23 ENCOUNTER — Encounter: Payer: Self-pay | Admitting: Obstetrics and Gynecology

## 2022-08-23 VITALS — BP 132/85 | HR 106 | Wt 198.0 lb

## 2022-08-23 DIAGNOSIS — Z3481 Encounter for supervision of other normal pregnancy, first trimester: Secondary | ICD-10-CM | POA: Diagnosis not present

## 2022-08-23 DIAGNOSIS — Z98891 History of uterine scar from previous surgery: Secondary | ICD-10-CM

## 2022-08-23 DIAGNOSIS — G43809 Other migraine, not intractable, without status migrainosus: Secondary | ICD-10-CM | POA: Diagnosis not present

## 2022-08-23 DIAGNOSIS — Z3490 Encounter for supervision of normal pregnancy, unspecified, unspecified trimester: Secondary | ICD-10-CM | POA: Diagnosis present

## 2022-08-23 DIAGNOSIS — Z3491 Encounter for supervision of normal pregnancy, unspecified, first trimester: Secondary | ICD-10-CM

## 2022-08-23 DIAGNOSIS — Z8759 Personal history of other complications of pregnancy, childbirth and the puerperium: Secondary | ICD-10-CM

## 2022-08-23 DIAGNOSIS — O0990 Supervision of high risk pregnancy, unspecified, unspecified trimester: Secondary | ICD-10-CM | POA: Insufficient documentation

## 2022-08-23 DIAGNOSIS — O10919 Unspecified pre-existing hypertension complicating pregnancy, unspecified trimester: Secondary | ICD-10-CM | POA: Insufficient documentation

## 2022-08-23 DIAGNOSIS — Z3A10 10 weeks gestation of pregnancy: Secondary | ICD-10-CM

## 2022-08-23 DIAGNOSIS — O34219 Maternal care for unspecified type scar from previous cesarean delivery: Secondary | ICD-10-CM | POA: Insufficient documentation

## 2022-08-23 MED ORDER — ASPIRIN 81 MG PO TBEC: 81.0000 mg | DELAYED_RELEASE_TABLET | Freq: Every day | ORAL | 12 refills | Status: DC

## 2022-08-23 NOTE — Progress Notes (Signed)
INITIAL PRENATAL VISIT NOTE  Subjective:  Susan Krause is a 31 y.o. [id number] at 110w0d by LMP being seen today for her initial prenatal visit. She has an obstetric history significant for 1 term delivery induced for gHTN per patient. She had left salpingectomy with prior ectopic, methotrexate with 2nd ectopic. H/o induction for gHTN and subsequent c-section for arrest of descent. They told her she would need to have repeat c-section. She has a medical history significant for PVCs and hypoglycemia, treats by eating. She takes labetalol 100 mg daily for chronic migraines, rx BID but she does well with once daily.  Patient reports fatigue. Uterus is very sore in general. Denies leaking of fluid.    Past Medical History:  Diagnosis Date   ADHD (attention deficit hyperactivity disorder)    No meds   Cervical dysplasia    Chlamydia    Head injury with loss of consciousness ([place]) 03/2020   Migraine    Propanolol 10 mg   OCD (obsessive compulsive disorder)    PTSD (post-traumatic stress disorder)    no meds   PVC (premature ventricular contraction)    PVC's (premature ventricular contractions) 2018   has palpations when she has PV's.   Sinus tachycardia     Past Surgical History:  Procedure Laterality Date   CESAREAN SECTION N/A 07/18/2018   Procedure: CESAREAN SECTION;  Surgeon: Sanjuana Kava, MD;  Location: [place];  Service: Obstetrics;  Laterality: N/A;   DIAGNOSTIC LAPAROSCOPY WITH REMOVAL OF ECTOPIC PREGNANCY Left 12/20/2020   Procedure: LAPAROSCOPIC LEFT SALPINGECTOMY WITH REMOVAL OF ECTOPIC PREGNANCY;  Surgeon: Donnamae Jude, MD;  Location: [place];  Service: Gynecology;  Laterality: Left;   DILATION AND CURETTAGE OF UTERUS N/A 07/01/2021   Procedure: DILATATION AND CURETTAGE WITH PATHOLOGY;  Surgeon: Radene Gunning, MD;  Location: [place];  Service: Gynecology;  Laterality: N/A;   SALPINGECTOMY Left 12/2020   WISDOM TOOTH EXTRACTION      OB History  Gravida Para Term  Preterm AB Living  6 1 1   4 1   SAB IAB Ectopic Multiple Live Births  2   2 0 1    # Outcome Date GA Lbr Len/2nd Weight Sex Delivery Anes PTL Lv  6 Current           5 SAB 12/2021          4 Ectopic 06/2021          3 SAB 05/2021          2 Ectopic 12/20/20 [redacted]w[redacted]d         1 Term 07/18/18 [redacted]w[redacted]d / 04:17 8 lb 3 oz (3.715 kg) M CS-Vac EPI  LIV    Social History   Socioeconomic History   Marital status: Single    Spouse name: Not on file   Number of children: Not on file   Years of education: Not on file   Highest education level: Not on file  Occupational History   Not on file  Tobacco Use   Smoking status: Never   Smokeless tobacco: Never  Vaping Use   Vaping Use: Never used  Substance and Sexual Activity   Alcohol use: No   Drug use: No   Sexual activity: Not Currently    Birth control/protection: None  Other Topics Concern   Not on file  Social History Narrative   Not on file   Social Determinants of Health   Financial Resource Strain: Not on file  Food Insecurity: Not on  file  Transportation Needs: Not on file  Physical Activity: Not on file  Stress: Not on file  Social Connections: Not on file    Family History  Problem Relation Age of Onset   ADD / ADHD Mother    Anxiety disorder Mother    Depression Mother    Hyperlipidemia Mother    Obesity Mother    ADD / ADHD Father    Anxiety disorder Sister    Asthma Sister    Depression Sister    Intellectual disability Brother    Learning disabilities Brother    ADD / ADHD Sister    Anxiety disorder Maternal Aunt    Diabetes Maternal Aunt    Alcohol abuse Maternal Uncle    Hyperlipidemia Maternal Uncle    Anxiety disorder Paternal Uncle    Diabetes Paternal Uncle    Anxiety disorder Maternal Grandmother    Diabetes Maternal Grandmother    Hyperlipidemia Maternal Grandmother    Varicose Veins Maternal Grandmother    Cancer Maternal Grandfather    Hyperlipidemia Maternal Grandfather    Obesity Maternal  Grandfather    Vision loss Maternal Grandfather    Cancer Paternal Grandmother     Current Outpatient Medications:    amoxicillin (AMOXIL) 875 MG tablet, Take by mouth., Disp: , Rfl:    aspirin EC 81 MG tablet, Take 1 tablet (81 mg total) by mouth daily. Swallow whole., Disp: 30 tablet, Rfl: 12   cetirizine (ZYRTEC) 10 MG tablet, Take 10 mg by mouth daily., Disp: , Rfl:    EPINEPHrine 0.3 mg/0.3 mL IJ SOAJ injection, SMARTSIG:1 Milligram(s) IM Daily, Disp: , Rfl:    labetalol (NORMODYNE) 100 MG tablet, Take 100 mg by mouth once., Disp: , Rfl:    Prenatal Multivit-Min-Fe-FA (PRENATAL 1 + IRON PO), , Disp: , Rfl:    promethazine (PHENERGAN) 25 MG tablet, Take 1 tablet (25 mg total) by mouth every 6 (six) hours as needed for nausea or vomiting., Disp: 30 tablet, Rfl: 1   dextromethorphan (DELSYM) 30 MG/5ML liquid, Take by mouth. (Patient not taking: Reported on 08/23/2022), Disp: , Rfl:   Allergies  Allergen Reactions   Banana Swelling   Latex Swelling   Contrast Media [Iodinated Contrast Media] Palpitations   Gardasil [Hpv 4-Valent Vaccine Recombinant Vaccine]    Hpv Bival (Type 16,18) Recomb Vaccine  [Human Papillomavirus (16,18) Recomb Vac] Hives   Peanut Oil Swelling    Tongue swells/itching   Pumpkin Flavor Swelling   Lactose Intolerance (Gi) Other (See Comments)    Gi upset    Review of Systems: Negative except for what is mentioned in HPI.  Objective:   Vitals:   08/23/22 1000  BP: 132/85  Pulse: (!) 106  Weight: 198 lb (89.8 kg)    Fetal Status:         normal fetal heart rate obtained by [place]  Physical Exam: BP 132/85   Pulse (!) 106   Wt 198 lb (89.8 kg)   LMP 06/14/2022   BMI 37.41 kg/m  CONSTITUTIONAL: Well-developed, well-nourished female in no acute distress.  NEUROLOGIC: Alert and oriented to person, place, and time. Normal reflexes, muscle tone coordination. No cranial nerve deficit noted. PSYCHIATRIC: Normal mood and affect. Normal behavior. Normal  judgment and thought content. SKIN: Skin is warm and dry. No rash noted. Not diaphoretic. No erythema. No pallor. HENT:  Normocephalic, atraumatic, External right and left ear normal. Oropharynx is clear and moist EYES: Conjunctivae and EOM are normal. Pupils are equal, round, and reactive  to light. No scleral icterus.  NECK: Normal range of motion, supple, no masses CARDIOVASCULAR: Normal heart rate noted RESPIRATORY: Effort normal, no problems with respiration noted BREASTS: symmetric, non-tender, no masses palpable ABDOMEN: Soft, nontender, nondistended, gravid. GU: normal appearing external female genitalia, multiparous normal appearing cervix, scant white discharge in vagina, no lesions noted Bimanual: 10 weeks sized uterus, no adnexal tenderness or palpable lesions noted MUSCULOSKELETAL: Normal range of motion. EXT:  No edema and no tenderness. 2+ distal pulses.   Assessment and Plan:  Pregnancy: [id number] at [redacted]w[redacted]d by LMP  1. Encounter for supervision of normal pregnancy, antepartum, unspecified gravidity - US OB Limited; Future  1. Encounter for supervision of normal pregnancy, antepartum, unspecified gravidity Reviewed [facility] structure, multiple providers, fellows, medical students, virtual visits, MyChart.  - US OB Limited; Future - Cervicovaginal ancillary only( [place]) - HgB A1c - ABO/Rh; Future - Antibody screen; Future - CBC; Future - Hepatitis B surface antigen; Future - HIV Antibody (routine testing w rflx); Future - HIV (Save tube for possible reflex); Future - RPR; Future - Rubella screen; Future - Hepatitis C antibody; Future - will consider panorama, had paternity testing at outside lab and knows it is female fetus  2. H/O: C-section - Needs TOLAC counseling at future visit - previously told by another group she would need to have RCS  3. History of gestational hypertension Induced at 38 weeks per patient Start baby  aspirin 12-14 weeks  4. Chronic migraines - well controlled on labetalol (previously on propranolol) - labetalol okay in pregnancy, cont prn  Preterm labor symptoms and general obstetric precautions including but not limited to vaginal bleeding, contractions, leaking of fluid and fetal movement were reviewed in detail with the patient.  Please refer to After Visit Summary for other counseling recommendations.   Return in about 4 weeks (around 09/20/2022) for high OB.  Sloan Leiter 08/23/2022 11:53 AM

## 2022-08-24 ENCOUNTER — Other Ambulatory Visit: Payer: Self-pay | Admitting: Family Medicine

## 2022-08-24 LAB — CERVICOVAGINAL ANCILLARY ONLY
Bacterial Vaginitis (gardnerella): NEGATIVE
Candida Glabrata: NEGATIVE
Candida Vaginitis: POSITIVE — AB
Chlamydia: NEGATIVE
Comment: NEGATIVE
Comment: NEGATIVE
Comment: NEGATIVE
Comment: NEGATIVE
Comment: NEGATIVE
Comment: NORMAL
Neisseria Gonorrhea: NEGATIVE
Trichomonas: NEGATIVE

## 2022-08-24 MED ORDER — TERCONAZOLE 0.8 % VA CREA: 1.0000 | TOPICAL_CREAM | Freq: Every day | VAGINAL | 0 refills | Status: DC

## 2022-08-25 LAB — URINE CULTURE, OB REFLEX

## 2022-08-25 LAB — CULTURE, OB URINE

## 2022-08-27 LAB — HEMOGLOBIN A1C
Hgb A1c MFr Bld: 4.9 % of total Hgb (ref <5.7)
Mean Plasma Glucose: 94 mg/dL
eAG (mmol/L): 5.2 mmol/L

## 2022-08-27 LAB — ABO AND RH

## 2022-08-27 LAB — CBC
HCT: 35.2 % (ref 35.0–45.0)
Hemoglobin: 12.4 g/dL (ref 11.7–15.5)
MCH: 30 pg (ref 27.0–33.0)
MCHC: 35.2 g/dL (ref 32.0–36.0)
MCV: 85 fL (ref 80.0–100.0)
MPV: 11.2 fL (ref 7.5–12.5)
Platelets: 309 10*3/uL (ref 140–400)
RBC: 4.14 10*6/uL (ref 3.80–5.10)
RDW: 12.3 % (ref 11.0–15.0)
WBC: 11.9 10*3/uL — ABNORMAL HIGH (ref 3.8–10.8)

## 2022-08-27 LAB — COMPREHENSIVE METABOLIC PANEL
AG Ratio: 1.6 (calc) (ref 1.0–2.5)
ALT: 10 U/L (ref 6–29)
AST: 13 U/L (ref 10–30)
Albumin: 3.9 g/dL (ref 3.6–5.1)
Alkaline phosphatase (APISO): 60 U/L (ref 31–125)
BUN: 10 mg/dL (ref 7–25)
CO2: 26 mmol/L (ref 20–32)
Calcium: 9.4 mg/dL (ref 8.6–10.2)
Chloride: 102 mmol/L (ref 98–110)
Creat: 0.72 mg/dL (ref 0.50–0.96)
Globulin: 2.5 g/dL (calc) (ref 1.9–3.7)
Glucose, Bld: 76 mg/dL (ref 65–99)
Potassium: 3.7 mmol/L (ref 3.5–5.3)
Sodium: 137 mmol/L (ref 135–146)
Total Bilirubin: 0.3 mg/dL (ref 0.2–1.2)
Total Protein: 6.4 g/dL (ref 6.1–8.1)

## 2022-08-27 LAB — RUBELLA SCREEN: Rubella: 1.16 Index

## 2022-08-27 LAB — HEPATITIS C ANTIBODY: Hepatitis C Ab: NONREACTIVE

## 2022-08-27 LAB — ANTIBODY SCREEN: Antibody Screen: NOT DETECTED

## 2022-08-27 LAB — RPR: RPR Ser Ql: NONREACTIVE

## 2022-08-27 LAB — HIV ANTIBODY (ROUTINE TESTING W REFLEX): HIV 1&2 Ab, 4th Generation: NONREACTIVE

## 2022-08-27 LAB — HEPATITIS B SURFACE ANTIGEN: Hepatitis B Surface Ag: NONREACTIVE

## 2022-09-07 ENCOUNTER — Inpatient Hospital Stay (HOSPITAL_COMMUNITY)
Admission: AD | Admit: 2022-09-07 | Discharge: 2022-09-08 | Disposition: A | Discharge status: Home / Self Care | Payer: BC Managed Care – PPO | Attending: Obstetrics and Gynecology | Admitting: Obstetrics and Gynecology

## 2022-09-07 ENCOUNTER — Encounter (HOSPITAL_COMMUNITY): Payer: Self-pay | Admitting: Obstetrics and Gynecology

## 2022-09-07 ENCOUNTER — Other Ambulatory Visit: Payer: Self-pay

## 2022-09-07 DIAGNOSIS — O99611 Diseases of the digestive system complicating pregnancy, first trimester: Secondary | ICD-10-CM | POA: Insufficient documentation

## 2022-09-07 DIAGNOSIS — Z3A12 12 weeks gestation of pregnancy: Secondary | ICD-10-CM | POA: Insufficient documentation

## 2022-09-07 DIAGNOSIS — K5901 Slow transit constipation: Secondary | ICD-10-CM | POA: Insufficient documentation

## 2022-09-07 LAB — URINALYSIS, ROUTINE W REFLEX MICROSCOPIC
Bilirubin Urine: NEGATIVE
Glucose, UA: NEGATIVE mg/dL
Hgb urine dipstick: NEGATIVE
Ketones, ur: NEGATIVE mg/dL
Leukocytes,Ua: NEGATIVE
Nitrite: NEGATIVE
Protein, ur: NEGATIVE mg/dL
Specific Gravity, Urine: 1.012 (ref 1.005–1.030)
pH: 6 (ref 5.0–8.0)

## 2022-09-07 MED ORDER — SORBITOL 70 % SOLN
960.0000 mL | TOPICAL_OIL | Freq: Once | ORAL | Status: AC
  Administered 2022-09-08: 960 mL via RECTAL
  Filled 2022-09-07: qty 240

## 2022-09-07 NOTE — MAU Note (Addendum)
.  Susan Krause is a 31 y.o. at [redacted]w[redacted]d here in MAU reporting: Pt reports ongoing constipation about every 3 days she will have an episode of diarrhea. Pt reports that she was started on miralax 5 days ago. Pt reports on Sunday she was able to push a little bit of poop out. Pt reports she has not gone since and is starting to give her back pain.   FHT 166 Onset of complaint: ongoing throughout the pregnancy  Pain score: 4/10 back There were no vitals filed for this visit.    Lab orders placed from triage:   ua

## 2022-09-07 NOTE — MAU Provider Note (Signed)
History     CSN: [id number]  Arrival date and time: 09/07/22 2237   Event Date/Time   First Provider Initiated Contact with Patient 09/07/22 2318      Chief Complaint  Patient presents with   Back Pain   31 y.o. [id number] @12 .1 wks presenting with constipation. Reports last BM was 5 days ago. Reports LAP, bloating and back pain. She is eating and drinking well. States she eats lots of veggies and drinks water. Denies VB. She has tried Miralax bid for the last 2 days but it hasn't helped. She is passing gas.    OB History     Gravida  6   Para  1   Term  1   Preterm      AB  4   Living  1      SAB  2   IAB      Ectopic  2   Multiple  0   Live Births  1           Past Medical History:  Diagnosis Date   ADHD (attention deficit hyperactivity disorder)    No meds   Cervical dysplasia    Chlamydia    Head injury with loss of consciousness (HCC) 03/2020   Migraine    Propanolol 10 mg   OCD (obsessive compulsive disorder)    PTSD (post-traumatic stress disorder)    no meds   PVC (premature ventricular contraction)    PVC's (premature ventricular contractions) 2018   has palpations when she has PV's.   Sinus tachycardia     Past Surgical History:  Procedure Laterality Date   CESAREAN SECTION N/A 07/18/2018   Procedure: CESAREAN SECTION;  Surgeon: 13/01/2018, MD;  Location: [facility] BIRTHING SUITES;  Service: Obstetrics;  Laterality: N/A;   DIAGNOSTIC LAPAROSCOPY WITH REMOVAL OF ECTOPIC PREGNANCY Left 12/20/2020   Procedure: LAPAROSCOPIC LEFT SALPINGECTOMY WITH REMOVAL OF ECTOPIC PREGNANCY;  Surgeon: 02/19/2021, MD;  Location: [facility] OR;  Service: Gynecology;  Laterality: Left;   DILATION AND CURETTAGE OF UTERUS N/A 07/01/2021   Procedure: DILATATION AND CURETTAGE WITH PATHOLOGY;  Surgeon: 07/03/2021, MD;  Location: [facility] OR;  Service: Gynecology;  Laterality: N/A;   SALPINGECTOMY Left 12/2020   WISDOM TOOTH EXTRACTION      Family History  Problem  Relation Age of Onset   ADD / ADHD Mother    Anxiety disorder Mother    Depression Mother    Hyperlipidemia Mother    Obesity Mother    ADD / ADHD Father    Anxiety disorder Sister    Asthma Sister    Depression Sister    Intellectual disability Brother    Learning disabilities Brother    ADD / ADHD Sister    Anxiety disorder Maternal Aunt    Diabetes Maternal Aunt    Alcohol abuse Maternal Uncle    Hyperlipidemia Maternal Uncle    Anxiety disorder Paternal Uncle    Diabetes Paternal Uncle    Anxiety disorder Maternal Grandmother    Diabetes Maternal Grandmother    Hyperlipidemia Maternal Grandmother    Varicose Veins Maternal Grandmother    Cancer Maternal Grandfather    Hyperlipidemia Maternal Grandfather    Obesity Maternal Grandfather    Vision loss Maternal Grandfather    Cancer Paternal Grandmother     Social History   Tobacco Use   Smoking status: Never   Smokeless tobacco: Never  Vaping Use   Vaping Use: Never used  Substance Use Topics  Alcohol use: No   Drug use: No    Allergies:  Allergies  Allergen Reactions   Banana Swelling   Latex Swelling   Contrast Media [Iodinated Contrast Media] Palpitations   Gardasil [Hpv 4-Valent Vaccine Recombinant Vaccine]    Hpv Bival (Type 16,18) Recomb Vaccine  [Human Papillomavirus (16,18) Recomb Vac] Hives   Peanut Oil Swelling    Tongue swells/itching   Pumpkin Flavor Swelling   Lactose Intolerance (Gi) Other (See Comments)    Gi upset    Medications Prior to Admission  Medication Sig Dispense Refill Last Dose   cetirizine (ZYRTEC) 10 MG tablet Take 10 mg by mouth daily.   09/06/2022   labetalol (NORMODYNE) 100 MG tablet Take 100 mg by mouth once.   09/06/2022   aspirin EC 81 MG tablet Take 1 tablet (81 mg total) by mouth daily. Swallow whole. 30 tablet 12    dextromethorphan (DELSYM) 30 MG/5ML liquid Take by mouth. (Patient not taking: Reported on 08/23/2022)      EPINEPHrine 0.3 mg/0.3 mL IJ SOAJ  injection SMARTSIG:1 Milligram(s) IM Daily      Prenatal Multivit-Min-Fe-FA (PRENATAL 1 + IRON PO)       promethazine (PHENERGAN) 25 MG tablet Take 1 tablet (25 mg total) by mouth every 6 (six) hours as needed for nausea or vomiting. 30 tablet 1    terconazole (TERAZOL 3) 0.8 % vaginal cream Place 1 applicator vaginally at bedtime. 20 g 0     Review of Systems  Gastrointestinal:  Positive for abdominal pain and constipation. Negative for nausea and vomiting.  Genitourinary:  Negative for vaginal bleeding.   Physical Exam   Blood pressure 134/88, pulse 86, temperature 98.2 F (36.8 C), resp. rate 16, last menstrual period 06/14/2022, SpO2 100 %.  Physical Exam Vitals and nursing note reviewed.  Constitutional:      General: She is not in acute distress.    Appearance: Normal appearance.  HENT:     Head: Normocephalic and atraumatic.  Cardiovascular:     Rate and Rhythm: Normal rate.  Pulmonary:     Effort: Pulmonary effort is normal. No respiratory distress.  Abdominal:     General: There is no distension.     Palpations: Abdomen is soft.     Tenderness: There is no abdominal tenderness.  Musculoskeletal:        General: Normal range of motion.     Cervical back: Normal range of motion.  Skin:    General: Skin is warm and dry.  Neurological:     General: No focal deficit present.     Mental Status: She is alert and oriented to person, place, and time.  Psychiatric:        Mood and Affect: Mood normal.        Behavior: Behavior normal.   FHT 166  Results for orders placed or performed during the hospital encounter of 09/07/22 (from the past 24 hour(s))  Urinalysis, Routine w reflex microscopic     Status: Abnormal   Collection Time: 09/07/22 10:47 PM  Result Value Ref Range   Color, Urine STRAW (A) YELLOW   APPearance CLEAR CLEAR   Specific Gravity, Urine 1.012 1.005 - 1.030   pH 6.0 5.0 - 8.0   Glucose, UA NEGATIVE NEGATIVE mg/dL   Hgb urine dipstick NEGATIVE  NEGATIVE   Bilirubin Urine NEGATIVE NEGATIVE   Ketones, ur NEGATIVE NEGATIVE mg/dL   Protein, ur NEGATIVE NEGATIVE mg/dL   Nitrite NEGATIVE NEGATIVE   Leukocytes,Ua NEGATIVE NEGATIVE  RBC / HPF 0-5 0 - 5 RBC/hpf   WBC, UA 0-5 0 - 5 WBC/hpf   Bacteria, UA RARE (A) NONE SEEN   Squamous Epithelial / LPF 0-5 0 - 5 /HPF    MAU Course  Procedures SMOG enema  MDM Labs reviewed. Had several good BMs after enema. Discussed measures to prevent constipation. Stable for discharge.  Assessment and Plan   1. [redacted] weeks gestation of pregnancy   2. Slow transit constipation    Discharge home Follow up at [facility] as scheduled Return precautions  Allergies as of 09/08/2022       Reactions   Banana Swelling   Latex Swelling   Contrast Media [iodinated Contrast Media] Palpitations   Gardasil [hpv 4-valent Vaccine Recombinant Vaccine]    Hpv Bival (type 16,18) Recomb Vaccine  [human Papillomavirus (16,18) Recomb Vac] Hives   Peanut Oil Swelling   Tongue swells/itching   Pumpkin Flavor Swelling   Lactose Intolerance (gi) Other (See Comments)   Gi upset        Medication List     STOP taking these medications    dextromethorphan 30 MG/5ML liquid Commonly known as: DELSYM       TAKE these medications    aspirin EC 81 MG tablet Take 1 tablet (81 mg total) by mouth daily. Swallow whole.   cetirizine 10 MG tablet Commonly known as: ZYRTEC Take 10 mg by mouth daily.   EPINEPHrine 0.3 mg/0.3 mL Soaj injection Commonly known as: EPI-PEN SMARTSIG:1 Milligram(s) IM Daily   labetalol 100 MG tablet Commonly known as: NORMODYNE Take 100 mg by mouth once.   PRENATAL 1 + IRON PO   promethazine 25 MG tablet Commonly known as: PHENERGAN Take 1 tablet (25 mg total) by mouth every 6 (six) hours as needed for nausea or vomiting.   terconazole 0.8 % vaginal cream Commonly known as: TERAZOL 3 Place 1 applicator vaginally at bedtime.       Donette Larry, CNM 09/08/2022, 1:53  AM

## 2022-09-08 DIAGNOSIS — Z3A12 12 weeks gestation of pregnancy: Secondary | ICD-10-CM

## 2022-09-08 DIAGNOSIS — O99611 Diseases of the digestive system complicating pregnancy, first trimester: Secondary | ICD-10-CM | POA: Diagnosis present

## 2022-09-08 DIAGNOSIS — K5901 Slow transit constipation: Secondary | ICD-10-CM

## 2022-09-08 MED ORDER — PROMETHAZINE HCL 25 MG PO TABS
25.0000 mg | ORAL_TABLET | Freq: Four times a day (QID) | ORAL | Status: DC | PRN
  Administered 2022-09-08: 25 mg via ORAL
  Filled 2022-09-08: qty 1

## 2022-09-08 NOTE — Discharge Instructions (Signed)

## 2022-09-23 ENCOUNTER — Ambulatory Visit (INDEPENDENT_AMBULATORY_CARE_PROVIDER_SITE_OTHER): Payer: BC Managed Care – PPO | Admitting: Obstetrics and Gynecology

## 2022-09-23 VITALS — BP 133/87 | HR 101 | Wt 202.0 lb

## 2022-09-23 DIAGNOSIS — Z3A14 14 weeks gestation of pregnancy: Secondary | ICD-10-CM

## 2022-09-23 DIAGNOSIS — Z98891 History of uterine scar from previous surgery: Secondary | ICD-10-CM

## 2022-09-23 DIAGNOSIS — Z3482 Encounter for supervision of other normal pregnancy, second trimester: Secondary | ICD-10-CM

## 2022-09-23 DIAGNOSIS — O34211 Maternal care for low transverse scar from previous cesarean delivery: Secondary | ICD-10-CM

## 2022-09-23 DIAGNOSIS — Z8759 Personal history of other complications of pregnancy, childbirth and the puerperium: Secondary | ICD-10-CM
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
## 2022-10-03 ENCOUNTER — Encounter: Payer: Self-pay | Admitting: Obstetrics and Gynecology

## 2022-10-14 ENCOUNTER — Encounter: Payer: Self-pay | Admitting: Obstetrics and Gynecology

## 2022-10-14 ENCOUNTER — Ambulatory Visit (INDEPENDENT_AMBULATORY_CARE_PROVIDER_SITE_OTHER): Payer: BC Managed Care – PPO | Admitting: Obstetrics and Gynecology

## 2022-10-14 VITALS — BP 129/75 | HR 89 | Wt 204.0 lb

## 2022-10-14 DIAGNOSIS — Z3A17 17 weeks gestation of pregnancy: Secondary | ICD-10-CM

## 2022-10-14 DIAGNOSIS — Z98891 History of uterine scar from previous surgery: Secondary | ICD-10-CM

## 2022-10-14 DIAGNOSIS — Z3482 Encounter for supervision of other normal pregnancy, second trimester: Secondary | ICD-10-CM

## 2022-10-14 DIAGNOSIS — Z8759 Personal history of other complications of pregnancy, childbirth and the puerperium: Secondary | ICD-10-CM
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
## 2022-10-25 ENCOUNTER — Ambulatory Visit: Payer: BC Managed Care – PPO | Attending: Obstetrics and Gynecology

## 2022-10-25 ENCOUNTER — Encounter: Payer: Self-pay | Admitting: *Deleted

## 2022-10-25 ENCOUNTER — Other Ambulatory Visit: Payer: Self-pay | Admitting: *Deleted

## 2022-10-25 ENCOUNTER — Ambulatory Visit: Payer: BC Managed Care – PPO | Admitting: *Deleted

## 2022-10-25 VITALS — BP 131/84 | HR 94

## 2022-10-25 DIAGNOSIS — Z3689 Encounter for other specified antenatal screening: Secondary | ICD-10-CM | POA: Diagnosis present

## 2022-10-25 DIAGNOSIS — O99412 Diseases of the circulatory system complicating pregnancy, second trimester: Secondary | ICD-10-CM | POA: Diagnosis not present

## 2022-10-25 DIAGNOSIS — O321XX0 Maternal care for breech presentation, not applicable or unspecified: Secondary | ICD-10-CM | POA: Diagnosis not present

## 2022-10-25 DIAGNOSIS — Z3490 Encounter for supervision of normal pregnancy, unspecified, unspecified trimester: Secondary | ICD-10-CM

## 2022-10-25 DIAGNOSIS — Z363 Encounter for antenatal screening for malformations: Secondary | ICD-10-CM | POA: Diagnosis not present

## 2022-10-25 DIAGNOSIS — O99212 Obesity complicating pregnancy, second trimester: Secondary | ICD-10-CM | POA: Diagnosis not present

## 2022-10-25 DIAGNOSIS — O1492 Unspecified pre-eclampsia, second trimester: Secondary | ICD-10-CM | POA: Insufficient documentation

## 2022-10-25 DIAGNOSIS — I493 Ventricular premature depolarization: Secondary | ICD-10-CM | POA: Insufficient documentation

## 2022-10-25 DIAGNOSIS — Z3A19 19 weeks gestation of pregnancy: Secondary | ICD-10-CM | POA: Diagnosis not present

## 2022-10-25 DIAGNOSIS — O09292 Supervision of pregnancy with other poor reproductive or obstetric history, second trimester: Secondary | ICD-10-CM | POA: Diagnosis not present

## 2022-10-25 DIAGNOSIS — Z362 Encounter for other antenatal screening follow-up: Secondary | ICD-10-CM

## 2022-10-27 ENCOUNTER — Telehealth: Payer: Self-pay | Admitting: *Deleted

## 2022-10-27 NOTE — Telephone Encounter (Signed)
Pt called and I returned her call.  LVM to return my call.

## 2022-10-28 ENCOUNTER — Ambulatory Visit (INDEPENDENT_AMBULATORY_CARE_PROVIDER_SITE_OTHER): Payer: BC Managed Care – PPO | Admitting: Obstetrics and Gynecology

## 2022-10-28 VITALS — BP 130/79 | HR 110 | Temp 98.5°F | Wt 207.0 lb

## 2022-10-28 DIAGNOSIS — R21 Rash and other nonspecific skin eruption: Secondary | ICD-10-CM

## 2022-10-28 DIAGNOSIS — Z3A19 19 weeks gestation of pregnancy: Secondary | ICD-10-CM

## 2022-10-28 DIAGNOSIS — Z3482 Encounter for supervision of other normal pregnancy, second trimester: Secondary | ICD-10-CM

## 2022-10-28 MED ORDER — TRIAMCINOLONE ACETONIDE 0.025 % EX OINT: 1.0000 | TOPICAL_OINTMENT | Freq: Two times a day (BID) | CUTANEOUS | 0 refills | Status: DC
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
## 2022-10-28 NOTE — Progress Notes (Signed)
Rash started 4 days ago and is spreading.  Has been taking Benadryl PO and cream with no relief.
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
## 2022-11-11 ENCOUNTER — Encounter: Payer: Self-pay | Admitting: Obstetrics and Gynecology

## 2022-11-11 ENCOUNTER — Ambulatory Visit (INDEPENDENT_AMBULATORY_CARE_PROVIDER_SITE_OTHER): Payer: BC Managed Care – PPO | Admitting: Obstetrics and Gynecology

## 2022-11-11 VITALS — BP 128/84 | HR 96 | Wt 215.0 lb

## 2022-11-11 DIAGNOSIS — Z3482 Encounter for supervision of other normal pregnancy, second trimester: Secondary | ICD-10-CM

## 2022-11-11 DIAGNOSIS — R12 Heartburn: Secondary | ICD-10-CM

## 2022-11-11 DIAGNOSIS — Z98891 History of uterine scar from previous surgery: Secondary | ICD-10-CM

## 2022-11-11 DIAGNOSIS — Z3A21 21 weeks gestation of pregnancy: Secondary | ICD-10-CM

## 2022-11-11 DIAGNOSIS — Z8759 Personal history of other complications of pregnancy, childbirth and the puerperium: Secondary | ICD-10-CM

## 2022-11-11 MED ORDER — FAMOTIDINE 20 MG PO TABS: 20.0000 mg | ORAL_TABLET | Freq: Two times a day (BID) | ORAL | 3 refills | Status: DC

## 2022-11-22 ENCOUNTER — Inpatient Hospital Stay (HOSPITAL_COMMUNITY)
Admission: AD | Admit: 2022-11-22 | Discharge: 2022-11-22 | Disposition: A | Discharge status: Home / Self Care | Payer: BC Managed Care – PPO | Attending: Family Medicine | Admitting: Family Medicine

## 2022-11-22 ENCOUNTER — Encounter (HOSPITAL_COMMUNITY): Payer: Self-pay | Admitting: Family Medicine

## 2022-11-22 DIAGNOSIS — Z3A23 23 weeks gestation of pregnancy: Secondary | ICD-10-CM | POA: Diagnosis not present

## 2022-11-22 DIAGNOSIS — O26892 Other specified pregnancy related conditions, second trimester: Secondary | ICD-10-CM | POA: Diagnosis not present

## 2022-11-22 DIAGNOSIS — R42 Dizziness and giddiness: Secondary | ICD-10-CM | POA: Insufficient documentation

## 2022-11-22 DIAGNOSIS — R0789 Other chest pain: Secondary | ICD-10-CM | POA: Diagnosis not present

## 2022-11-22 DIAGNOSIS — R030 Elevated blood-pressure reading, without diagnosis of hypertension: Secondary | ICD-10-CM | POA: Diagnosis not present

## 2022-11-22 DIAGNOSIS — O368120 Decreased fetal movements, second trimester, not applicable or unspecified: Secondary | ICD-10-CM | POA: Diagnosis not present

## 2022-11-22 DIAGNOSIS — O34219 Maternal care for unspecified type scar from previous cesarean delivery: Secondary | ICD-10-CM | POA: Insufficient documentation

## 2022-11-22 DIAGNOSIS — O09292 Supervision of pregnancy with other poor reproductive or obstetric history, second trimester: Secondary | ICD-10-CM | POA: Diagnosis not present

## 2022-11-22 LAB — CBC
HCT: 33.9 % — ABNORMAL LOW (ref 36.0–46.0)
Hemoglobin: 11.8 g/dL — ABNORMAL LOW (ref 12.0–15.0)
MCH: 29.9 pg (ref 26.0–34.0)
MCHC: 34.8 g/dL (ref 30.0–36.0)
MCV: 85.8 fL (ref 80.0–100.0)
Platelets: 267 10*3/uL (ref 150–400)
RBC: 3.95 MIL/uL (ref 3.87–5.11)
RDW: 13.2 % (ref 11.5–15.5)
WBC: 11.5 10*3/uL — ABNORMAL HIGH (ref 4.0–10.5)
nRBC: 0 % (ref 0.0–0.2)

## 2022-11-22 LAB — PROTEIN / CREATININE RATIO, URINE
Creatinine, Urine: 17 mg/dL
Total Protein, Urine: <6 mg/dL

## 2022-11-22 LAB — COMPREHENSIVE METABOLIC PANEL
ALT: 14 U/L (ref 0–44)
AST: 18 U/L (ref 15–41)
Albumin: 2.8 g/dL — ABNORMAL LOW (ref 3.5–5.0)
Alkaline Phosphatase: 55 U/L (ref 38–126)
Anion gap: 7 (ref 5–15)
BUN: 6 mg/dL (ref 6–20)
CO2: 22 mmol/L (ref 22–32)
Calcium: 8.9 mg/dL (ref 8.9–10.3)
Chloride: 105 mmol/L (ref 98–111)
Creatinine, Ser: 0.56 mg/dL (ref 0.44–1.00)
GFR, Estimated: >60 mL/min (ref >60)
Glucose, Bld: 113 mg/dL — ABNORMAL HIGH (ref 70–99)
Potassium: 3.6 mmol/L (ref 3.5–5.1)
Sodium: 134 mmol/L — ABNORMAL LOW (ref 135–145)
Total Bilirubin: 0.3 mg/dL (ref 0.3–1.2)
Total Protein: 6.2 g/dL — ABNORMAL LOW (ref 6.5–8.1)

## 2022-11-22 LAB — URINALYSIS, ROUTINE W REFLEX MICROSCOPIC
Bilirubin Urine: NEGATIVE
Glucose, UA: NEGATIVE mg/dL
Hgb urine dipstick: NEGATIVE
Ketones, ur: NEGATIVE mg/dL
Leukocytes,Ua: NEGATIVE
Nitrite: NEGATIVE
Protein, ur: NEGATIVE mg/dL
Specific Gravity, Urine: 1.003 — ABNORMAL LOW (ref 1.005–1.030)
pH: 7 (ref 5.0–8.0)

## 2022-11-22 LAB — MAGNESIUM: Magnesium: 1.7 mg/dL (ref 1.7–2.4)

## 2022-11-22 LAB — TSH: TSH: 2.014 u[IU]/mL (ref 0.350–4.500)

## 2022-11-22 MED ORDER — LACTATED RINGERS IV BOLUS
1000.0000 mL | Freq: Once | INTRAVENOUS | Status: AC
  Administered 2022-11-22: 1000 mL via INTRAVENOUS

## 2022-11-22 MED ORDER — ACETAMINOPHEN 500 MG PO TABS
1000.0000 mg | ORAL_TABLET | Freq: Once | ORAL | Status: AC
  Administered 2022-11-22: 1000 mg via ORAL
  Filled 2022-11-22: qty 2

## 2022-11-22 NOTE — MAU Note (Signed)
.  Susan Krause is a 32 y.o. at [redacted]w[redacted]d here in MAU reporting: this morning at work she got very dizzy and started seeing double. Also reporting a headache and like her heart is racing. This morning she also had chest pressure. Denies Vb or LOF. DFM today.   Pain score: 1 Vitals:   11/22/22 1243  BP: (!) 163/103  Pulse: (!) 129  Resp: 15  Temp: 98.1 F (36.7 C)  SpO2: 100%     FHT:145 Lab orders placed from triage:  UA

## 2022-11-22 NOTE — MAU Provider Note (Signed)
History     CSN: [id number]  Arrival date and time: 11/22/22 1235   Event Date/Time   First Provider Initiated Contact with Patient 11/22/22 1303      Chief Complaint  Patient presents with   Dizziness   Decreased Fetal Movement   HPI This is a 32yo [id number] at 56w0dwith a pregnancy complicated by prior c/s. She was at work earlier today and started to see double and her heart started to race with chest tightness. She reclined in a chair for awhile, then called the office, who told her to come in to be evaluated. No palliating or provoking factors. Her symptoms have improved so she doesn't have the double vision. Chest still a little tight. No fevers, chills, nausea, vomiting, diarrhea.  OB History     Gravida  6   Para  1   Term  1   Preterm      AB  4   Living  1      SAB  2   IAB      Ectopic  2   Multiple  0   Live Births  1           Past Medical History:  Diagnosis Date   ADHD (attention deficit hyperactivity disorder)    No meds   Cervical dysplasia    Chlamydia    [place]   Migraine    Propanolol 10 mg   OCD (obsessive compulsive disorder)    PTSD (post-traumatic stress disorder)    no meds   PVC (premature ventricular contraction)    PVC's (premature ventricular contractions) 2018   has palpations when she has PV's.   Sinus tachycardia     Past Surgical History:  Procedure Laterality Date   CESAREAN SECTION N/A 07/18/2018   Procedure: CESAREAN SECTION;  Surgeon: PSanjuana Kava MD;  [place] [facility]  Service: Obstetrics;  Laterality: N/A;   DIAGNOSTIC LAPAROSCOPY WITH REMOVAL OF ECTOPIC PREGNANCY Left 12/20/2020   Procedure: LAPAROSCOPIC LEFT SALPINGECTOMY WITH REMOVAL OF ECTOPIC PREGNANCY;  Surgeon: PDonnamae Jude MD;  [place] [facility]  Service: Gynecology;  Laterality: Left;   DILATION AND CURETTAGE OF UTERUS N/A 07/01/2021   Procedure: DILATATION AND CURETTAGE WITH PATHOLOGY;   Surgeon: DRadene Gunning MD;  [place] [facility]  Service: Gynecology;  Laterality: N/A;   SALPINGECTOMY Left 12/2020   WISDOM TOOTH EXTRACTION      Family History  Problem Relation Age of Onset   ADD / ADHD Mother    Anxiety disorder Mother    Depression Mother    Hyperlipidemia Mother    Obesity Mother    ADD / ADHD Father    Anxiety disorder Sister    Asthma Sister    Depression Sister    Intellectual disability Brother    Learning disabilities Brother    ADD / ADHD Sister    Anxiety disorder Maternal Aunt    Diabetes Maternal Aunt    Alcohol abuse Maternal Uncle    Hyperlipidemia Maternal Uncle    Anxiety disorder Paternal Uncle    Diabetes Paternal Uncle    Anxiety disorder Maternal Grandmother    Diabetes Maternal Grandmother    Hyperlipidemia Maternal Grandmother    Varicose Veins Maternal Grandmother    Cancer Maternal Grandfather    Hyperlipidemia Maternal Grandfather    Obesity Maternal Grandfather    Vision loss Maternal Grandfather    Cancer Paternal Grandmother     Social History  Tobacco Use   Smoking status: Never   Smokeless tobacco: Never  Vaping Use   Vaping Use: Never used  Substance Use Topics   Alcohol use: No   Drug use: No    Allergies:  Allergies  Allergen Reactions   Banana Swelling   Latex Swelling   Peanut-Containing Drug Products Anaphylaxis   Gardasil [Hpv 4-Valent Vaccine Recombinant Vaccine]    Hpv Bival (Type 16,18) Recomb Vaccine  [Human Papillomavirus (16,18) Recomb Vac] Hives   Iodinated Contrast Media Palpitations and Diarrhea   Peanut Oil Swelling    Tongue swells/itching   Pumpkin Flavor Swelling   Milk (Cow) Other (See Comments)   Lactose Intolerance (Gi) Other (See Comments)    Gi upset    Medications Prior to Admission  Medication Sig Dispense Refill Last Dose   aspirin EC 81 MG tablet Take 1 tablet (81 mg total) by mouth daily. Swallow whole. 30 tablet 12    cetirizine (ZYRTEC) 10 MG chewable tablet Chew 10  mg by mouth daily.      famotidine (PEPCID) 20 MG tablet Take 1 tablet (20 mg total) by mouth 2 (two) times daily. 60 tablet 3    labetalol (NORMODYNE) 100 MG tablet Take 100 mg by mouth once.      polyethylene glycol (MIRALAX / GLYCOLAX) 17 g packet Take by mouth.      Prenatal Multivit-Min-Fe-FA (PRENATAL 1 + IRON PO)       triamcinolone (KENALOG) 0.025 % ointment Apply 1 Application topically 2 (two) times daily. (Patient not taking: Reported on 11/11/2022) 80 g 0     Review of Systems Physical Exam   Blood pressure 137/82, pulse 95, temperature 98.1 F (36.7 C), temperature source Oral, resp. rate 15, weight 98.9 kg, last menstrual period 06/14/2022, SpO2 100 %.  Patient Vitals for the past 24 hrs:  BP Temp Temp src Pulse Resp SpO2 Weight  11/22/22 1431 137/82 -- -- 95 -- 100 % --  11/22/22 1416 124/81 -- -- 96 -- 99 % --  11/22/22 1401 123/73 -- -- (!) 101 -- 98 % --  11/22/22 1346 137/86 -- -- 99 -- 99 % --  11/22/22 1338 (!) 143/86 -- -- (!) 106 -- -- --  11/22/22 1316 137/84 -- -- (!) 105 -- 98 % --  11/22/22 1315 (!) 141/93 -- -- (!) 107 -- -- --  11/22/22 1243 (!) 163/103 98.1 F (36.7 C) Oral (!) 129 15 100 % 98.9 kg     Physical Exam Vitals reviewed.  Constitutional:      Appearance: Normal appearance.  HENT:     Head: Normocephalic and atraumatic.  Cardiovascular:     Rate and Rhythm: Tachycardia present.  Pulmonary:     Effort: Pulmonary effort is normal. No respiratory distress.     Breath sounds: No wheezing.  Abdominal:     General: Abdomen is flat.     Palpations: Abdomen is soft.     Tenderness: There is no abdominal tenderness. There is no guarding.  Neurological:     General: No focal deficit present.     Mental Status: She is alert.  Psychiatric:        Mood and Affect: Mood normal.        Behavior: Behavior normal.        Thought Content: Thought content normal.        Judgment: Judgment normal.    Results for orders placed or performed  during the hospital encounter of 11/22/22 (from the  past 24 hour(s))  Urinalysis, Routine w reflex microscopic -Urine, Clean Catch     Status: Abnormal   Collection Time: 11/22/22 12:51 PM  Result Value Ref Range   Color, Urine STRAW (A) YELLOW   APPearance CLEAR CLEAR   Specific Gravity, Urine 1.003 (L) 1.005 - 1.030   pH 7.0 5.0 - 8.0   Glucose, UA NEGATIVE NEGATIVE mg/dL   Hgb urine dipstick NEGATIVE NEGATIVE   Bilirubin Urine NEGATIVE NEGATIVE   Ketones, ur NEGATIVE NEGATIVE mg/dL   Protein, ur NEGATIVE NEGATIVE mg/dL   Nitrite NEGATIVE NEGATIVE   Leukocytes,Ua NEGATIVE NEGATIVE  Comprehensive metabolic panel     Status: Abnormal   Collection Time: 11/22/22 12:59 PM  Result Value Ref Range   Sodium 134 (L) 135 - 145 mmol/L   Potassium 3.6 3.5 - 5.1 mmol/L   Chloride 105 98 - 111 mmol/L   CO2 22 22 - 32 mmol/L   Glucose, Bld 113 (H) 70 - 99 mg/dL   BUN 6 6 - 20 mg/dL   Creatinine, Ser 0.56 0.44 - 1.00 mg/dL   Calcium 8.9 8.9 - 10.3 mg/dL   Total Protein 6.2 (L) 6.5 - 8.1 g/dL   Albumin 2.8 (L) 3.5 - 5.0 g/dL   AST 18 15 - 41 U/L   ALT 14 0 - 44 U/L   Alkaline Phosphatase 55 38 - 126 U/L   Total Bilirubin 0.3 0.3 - 1.2 mg/dL   GFR, Estimated >60 >60 mL/min   Anion gap 7 5 - 15  CBC     Status: Abnormal   Collection Time: 11/22/22 12:59 PM  Result Value Ref Range   WBC 11.5 (H) 4.0 - 10.5 K/uL   RBC 3.95 3.87 - 5.11 MIL/uL   Hemoglobin 11.8 (L) 12.0 - 15.0 g/dL   HCT 33.9 (L) 36.0 - 46.0 %   MCV 85.8 80.0 - 100.0 fL   MCH 29.9 26.0 - 34.0 pg   MCHC 34.8 30.0 - 36.0 g/dL   RDW 13.2 11.5 - 15.5 %   Platelets 267 150 - 400 K/uL   nRBC 0.0 0.0 - 0.2 %  Magnesium     Status: None   Collection Time: 11/22/22 12:59 PM  Result Value Ref Range   Magnesium 1.7 1.7 - 2.4 mg/dL  TSH     Status: None   Collection Time: 11/22/22 12:59 PM  Result Value Ref Range   TSH 2.014 0.350 - 4.500 uIU/mL  Protein / creatinine ratio, urine     Status: None   Collection Time: 11/22/22  12:59 PM  Result Value Ref Range   Creatinine, Urine 17 mg/dL   Total Protein, Urine <6 mg/dL   Protein Creatinine Ratio        0.00 - 0.15 mg/mg[Cre]     MAU Course  Procedures NST:  Baseline: 150  Variability: moderate Accelerations: 10x10  Decelerations: none Contractions: none   MDM IV fluid bolus. CMP, CBC, TSH, UP:C. Check EKG.  Assessment and Plan   1. [redacted] weeks gestation of pregnancy   2. Elevated BP without diagnosis of hypertension    Some elevated BPs - has h/o GHTN. BPs have settled out. Does have chronic migraines. Is on labetalol for migraine prevention.  Labs all normal. Symptoms now gone. Discharge to home. Has [place] on Wednesday. Will have [place] for BP check latter this week and appt next week.  Truett Mainland 11/22/2022, 2:32 PM
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
## 2022-11-24 ENCOUNTER — Other Ambulatory Visit: Payer: Self-pay | Admitting: *Deleted

## 2022-11-24 ENCOUNTER — Ambulatory Visit: Payer: BC Managed Care – PPO | Attending: Obstetrics

## 2022-11-24 ENCOUNTER — Ambulatory Visit: Payer: BC Managed Care – PPO | Admitting: *Deleted

## 2022-11-24 VITALS — BP 116/84 | HR 92

## 2022-11-24 DIAGNOSIS — R030 Elevated blood-pressure reading, without diagnosis of hypertension: Secondary | ICD-10-CM

## 2022-11-24 DIAGNOSIS — O09292 Supervision of pregnancy with other poor reproductive or obstetric history, second trimester: Secondary | ICD-10-CM

## 2022-11-24 DIAGNOSIS — O34219 Maternal care for unspecified type scar from previous cesarean delivery: Secondary | ICD-10-CM

## 2022-11-24 DIAGNOSIS — O99891 Other specified diseases and conditions complicating pregnancy: Secondary | ICD-10-CM | POA: Diagnosis not present

## 2022-11-24 DIAGNOSIS — O99212 Obesity complicating pregnancy, second trimester: Secondary | ICD-10-CM

## 2022-11-24 DIAGNOSIS — Z362 Encounter for other antenatal screening follow-up: Secondary | ICD-10-CM | POA: Diagnosis present

## 2022-11-24 DIAGNOSIS — O10912 Unspecified pre-existing hypertension complicating pregnancy, second trimester: Secondary | ICD-10-CM

## 2022-11-24 DIAGNOSIS — Z3A23 23 weeks gestation of pregnancy: Secondary | ICD-10-CM

## 2022-11-24 DIAGNOSIS — E669 Obesity, unspecified: Secondary | ICD-10-CM

## 2022-11-25 ENCOUNTER — Ambulatory Visit (INDEPENDENT_AMBULATORY_CARE_PROVIDER_SITE_OTHER): Payer: BC Managed Care – PPO | Admitting: Obstetrics and Gynecology

## 2022-11-25 VITALS — BP 132/80 | HR 111 | Wt 215.0 lb

## 2022-11-25 DIAGNOSIS — Z3A23 23 weeks gestation of pregnancy: Secondary | ICD-10-CM

## 2022-11-25 DIAGNOSIS — Z3482 Encounter for supervision of other normal pregnancy, second trimester: Secondary | ICD-10-CM

## 2022-11-25 DIAGNOSIS — Z8759 Personal history of other complications of pregnancy, childbirth and the puerperium: Secondary | ICD-10-CM

## 2022-11-25 DIAGNOSIS — Z98891 History of uterine scar from previous surgery: Secondary | ICD-10-CM

## 2022-11-25 DIAGNOSIS — G43109 Migraine with aura, not intractable, without status migrainosus: Secondary | ICD-10-CM

## 2022-11-28 ENCOUNTER — Encounter: Payer: Self-pay | Admitting: Obstetrics and Gynecology

## 2022-11-29 ENCOUNTER — Ambulatory Visit: Payer: BC Managed Care – PPO

## 2022-11-30 ENCOUNTER — Other Ambulatory Visit (HOSPITAL_COMMUNITY)
Admission: RE | Admit: 2022-11-30 | Discharge: 2022-11-30 | Disposition: A | Discharge status: Home / Self Care | Payer: BC Managed Care – PPO | Source: Ambulatory Visit | Attending: Obstetrics and Gynecology | Admitting: Obstetrics and Gynecology

## 2022-11-30 ENCOUNTER — Ambulatory Visit (INDEPENDENT_AMBULATORY_CARE_PROVIDER_SITE_OTHER): Payer: BC Managed Care – PPO

## 2022-11-30 DIAGNOSIS — N898 Other specified noninflammatory disorders of vagina: Secondary | ICD-10-CM

## 2022-11-30 DIAGNOSIS — O26899 Other specified pregnancy related conditions, unspecified trimester: Secondary | ICD-10-CM | POA: Insufficient documentation

## 2022-11-30 DIAGNOSIS — Z3A00 Weeks of gestation of pregnancy not specified: Secondary | ICD-10-CM | POA: Insufficient documentation

## 2022-11-30 NOTE — Progress Notes (Addendum)
Pt here for self swab due to vaginal itching and discharge. Pt will be contacted with results.

## 2022-12-01 ENCOUNTER — Other Ambulatory Visit: Payer: Self-pay

## 2022-12-01 ENCOUNTER — Encounter: Payer: BC Managed Care – PPO | Admitting: Obstetrics & Gynecology

## 2022-12-01 DIAGNOSIS — B379 Candidiasis, unspecified: Secondary | ICD-10-CM

## 2022-12-01 LAB — CERVICOVAGINAL ANCILLARY ONLY
Bacterial Vaginitis (gardnerella): NEGATIVE
Candida Glabrata: NEGATIVE
Candida Vaginitis: POSITIVE — AB
Comment: NEGATIVE
Comment: NEGATIVE
Comment: NEGATIVE

## 2022-12-01 MED ORDER — TERCONAZOLE 0.4 % VA CREA: 1.0000 | TOPICAL_CREAM | Freq: Every day | VAGINAL | 0 refills | Status: DC

## 2022-12-01 NOTE — Progress Notes (Signed)
Terazol sent per protocol for positive yeast

## 2022-12-16 ENCOUNTER — Ambulatory Visit (INDEPENDENT_AMBULATORY_CARE_PROVIDER_SITE_OTHER): Payer: BC Managed Care – PPO | Admitting: Obstetrics & Gynecology

## 2022-12-16 ENCOUNTER — Encounter: Payer: Self-pay | Admitting: Obstetrics & Gynecology

## 2022-12-16 VITALS — BP 131/78 | HR 94 | Wt 222.0 lb

## 2022-12-16 DIAGNOSIS — Z3A26 26 weeks gestation of pregnancy: Secondary | ICD-10-CM | POA: Diagnosis not present

## 2022-12-16 DIAGNOSIS — Z3482 Encounter for supervision of other normal pregnancy, second trimester: Secondary | ICD-10-CM

## 2022-12-16 NOTE — Progress Notes (Signed)
  LOW-RISK PREGNANCY VISIT Patient name: Susan Krause MRN [id number]  Date of birth: May 15, 1991 Chief Complaint:   Routine Prenatal Visit  History of Present Illness:   Susan Krause is a 32 y.o. [phone] female at [redacted]w[redacted]d with an Estimated Date of Delivery: 03/21/23 being seen today for ongoing management of a low-risk pregnancy.   -prior C-section, considering TOLAC     08/23/2022   10:29 AM 12/09/2021    9:04 AM 11/19/2021    9:42 AM 09/11/2015   11:47 AM  Depression screen PHQ 2/9  Decreased Interest 1 0 0   Down, Depressed, Hopeless 0 0    PHQ - 2 Score 1 0 0   Altered sleeping 0 0 0   Tired, decreased energy 3 1 2    Change in appetite 0 0 0   Feeling bad or failure about yourself  0 0 0   Trouble concentrating 0 0 0   Moving slowly or fidgety/restless 0 0 0   Suicidal thoughts 0 0 0   PHQ-9 Score 4 1 2       Information is confidential and restricted. Go to Review Flowsheets to unlock data.    Today she reports  [address] at night . Contractions: Not present. Vag. Bleeding: None.  Movement: Present. denies leaking of fluid. Review of Systems:   Pertinent items are noted in HPI Denies abnormal vaginal discharge w/ itching/odor/irritation, headaches, visual changes, shortness of breath, chest pain, abdominal pain, severe nausea/vomiting, or problems with urination or bowel movements unless otherwise stated above. Pertinent History Reviewed:  Reviewed past medical,surgical, social, obstetrical and family history.  Reviewed problem list, medications and allergies.  Physical Assessment:   Vitals:   12/16/22 0824  BP: 131/78  Pulse: 94  Weight: 222 lb (100.7 kg)  Body mass index is 41.95 kg/m.        Physical Examination:   General appearance: Well appearing, and in no distress  Mental status: Alert, oriented to person, place, and time  Skin: Warm & dry  Respiratory: Normal respiratory effort, no distress  Abdomen: Soft, gravid, nontender  Pelvic: Cervical exam deferred          Extremities: Edema: Trace  Psych:  mood and affect appropriate  Fetal Status: Fetal Heart Rate (bpm): 155 Fundal Height: 25 cm Movement: Present    Chaperone: n/a    No results found for this or any previous visit (from the past 24 hour(s)).   Assessment & Plan:  1) Low-risk pregnancy [id number] at [redacted]w[redacted]d with an Estimated Date of Delivery: 03/21/23   -prior C-section    Meds: No orders of the defined types were placed in this encounter.  Labs/procedures today: PN2 scheduled for tomorrow  Plan:  Continue routine obstetrical care  Next visit: prefers in person    Reviewed: Preterm labor symptoms and general obstetric precautions including but not limited to vaginal bleeding, contractions, leaking of fluid and fetal movement were reviewed in detail with the patient.  All questions were answered. \   Follow-up: Return in about 4 weeks (around 01/13/2023) for [place] visit.  No orders of the defined types were placed in this encounter.   Janyth Pupa, DO Attending [place], [facility] for [employer], [place]

## 2022-12-17 ENCOUNTER — Other Ambulatory Visit: Payer: Self-pay

## 2022-12-17 ENCOUNTER — Other Ambulatory Visit: Payer: BC Managed Care – PPO

## 2022-12-17 DIAGNOSIS — Z3482 Encounter for supervision of other normal pregnancy, second trimester: Secondary | ICD-10-CM

## 2022-12-18 LAB — HIV ANTIBODY (ROUTINE TESTING W REFLEX): HIV Screen 4th Generation wRfx: NONREACTIVE

## 2022-12-18 LAB — CBC
Hematocrit: 34.9 % (ref 34.0–46.6)
Hemoglobin: 11.4 g/dL (ref 11.1–15.9)
MCH: 28.9 pg (ref 26.6–33.0)
MCHC: 32.7 g/dL (ref 31.5–35.7)
MCV: 88 fL (ref 79–97)
Platelets: 260 10*3/uL (ref 150–450)
RBC: 3.95 x10E6/uL (ref 3.77–5.28)
RDW: 12.9 % (ref 11.7–15.4)
WBC: 12.5 10*3/uL — ABNORMAL HIGH (ref 3.4–10.8)

## 2022-12-18 LAB — GLUCOSE TOLERANCE, 2 HOURS W/ 1HR
Glucose, 1 hour: 108 mg/dL (ref 70–179)
Glucose, 2 hour: 108 mg/dL (ref 70–152)
Glucose, Fasting: 75 mg/dL (ref 70–91)

## 2022-12-18 LAB — RPR: RPR Ser Ql: NONREACTIVE

## 2022-12-29 ENCOUNTER — Other Ambulatory Visit: Payer: Self-pay | Admitting: *Deleted

## 2022-12-29 ENCOUNTER — Ambulatory Visit: Payer: BC Managed Care – PPO | Admitting: *Deleted

## 2022-12-29 ENCOUNTER — Ambulatory Visit: Payer: BC Managed Care – PPO | Attending: Obstetrics

## 2022-12-29 VITALS — BP 123/74 | HR 98

## 2022-12-29 DIAGNOSIS — Z3689 Encounter for other specified antenatal screening: Secondary | ICD-10-CM | POA: Insufficient documentation

## 2022-12-29 DIAGNOSIS — O10013 Pre-existing essential hypertension complicating pregnancy, third trimester: Secondary | ICD-10-CM | POA: Diagnosis not present

## 2022-12-29 DIAGNOSIS — O34219 Maternal care for unspecified type scar from previous cesarean delivery: Secondary | ICD-10-CM

## 2022-12-29 DIAGNOSIS — E669 Obesity, unspecified: Secondary | ICD-10-CM

## 2022-12-29 DIAGNOSIS — I493 Ventricular premature depolarization: Secondary | ICD-10-CM

## 2022-12-29 DIAGNOSIS — O09293 Supervision of pregnancy with other poor reproductive or obstetric history, third trimester: Secondary | ICD-10-CM

## 2022-12-29 DIAGNOSIS — O09299 Supervision of pregnancy with other poor reproductive or obstetric history, unspecified trimester: Secondary | ICD-10-CM

## 2022-12-29 DIAGNOSIS — O10912 Unspecified pre-existing hypertension complicating pregnancy, second trimester: Secondary | ICD-10-CM | POA: Diagnosis present

## 2022-12-29 DIAGNOSIS — Z3A28 28 weeks gestation of pregnancy: Secondary | ICD-10-CM

## 2022-12-29 DIAGNOSIS — O99413 Diseases of the circulatory system complicating pregnancy, third trimester: Secondary | ICD-10-CM

## 2022-12-29 DIAGNOSIS — Z6837 Body mass index (BMI) 37.0-37.9, adult: Secondary | ICD-10-CM

## 2022-12-29 DIAGNOSIS — O99213 Obesity complicating pregnancy, third trimester: Secondary | ICD-10-CM

## 2022-12-29 DIAGNOSIS — O99212 Obesity complicating pregnancy, second trimester: Secondary | ICD-10-CM | POA: Diagnosis present

## 2023-01-03 ENCOUNTER — Encounter: Payer: Self-pay | Admitting: Obstetrics and Gynecology

## 2023-01-04 ENCOUNTER — Ambulatory Visit (INDEPENDENT_AMBULATORY_CARE_PROVIDER_SITE_OTHER): Payer: BC Managed Care – PPO

## 2023-01-04 ENCOUNTER — Other Ambulatory Visit (HOSPITAL_COMMUNITY)
Admission: RE | Admit: 2023-01-04 | Discharge: 2023-01-04 | Disposition: A | Discharge status: Home / Self Care | Payer: BC Managed Care – PPO | Source: Ambulatory Visit

## 2023-01-04 VITALS — BP 133/86 | HR 102 | Temp 98.6°F | Wt 225.0 lb

## 2023-01-04 DIAGNOSIS — R109 Unspecified abdominal pain: Secondary | ICD-10-CM

## 2023-01-04 DIAGNOSIS — Z3A29 29 weeks gestation of pregnancy: Secondary | ICD-10-CM

## 2023-01-04 DIAGNOSIS — R399 Unspecified symptoms and signs involving the genitourinary system: Secondary | ICD-10-CM

## 2023-01-04 DIAGNOSIS — O26899 Other specified pregnancy related conditions, unspecified trimester: Secondary | ICD-10-CM

## 2023-01-04 DIAGNOSIS — N898 Other specified noninflammatory disorders of vagina: Secondary | ICD-10-CM | POA: Diagnosis not present

## 2023-01-04 LAB — POCT URINALYSIS DIPSTICK
Bilirubin, UA: NEGATIVE
Blood, UA: NEGATIVE
Glucose, UA: NEGATIVE
Ketones, UA: NEGATIVE
Leukocytes, UA: NEGATIVE
Nitrite, UA: NEGATIVE
Protein, UA: NEGATIVE
Spec Grav, UA: 1.01 (ref 1.010–1.025)
Urobilinogen, UA: 0.2 E.U./dL — AB
pH, UA: 7 (ref 5.0–8.0)
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
## 2023-01-05 ENCOUNTER — Other Ambulatory Visit: Payer: Self-pay

## 2023-01-05 LAB — CERVICOVAGINAL ANCILLARY ONLY
Bacterial Vaginitis (gardnerella): NEGATIVE
Candida Glabrata: NEGATIVE
Candida Vaginitis: POSITIVE — AB
Comment: NEGATIVE
Comment: NEGATIVE
Comment: NEGATIVE

## 2023-01-05 MED ORDER — TERCONAZOLE 0.4 % VA CREA: 1.0000 | TOPICAL_CREAM | Freq: Every day | VAGINAL | 0 refills | Status: DC

## 2023-01-06 LAB — URINE CULTURE, OB REFLEX

## 2023-01-06 LAB — CULTURE, OB URINE

## 2023-01-13 ENCOUNTER — Ambulatory Visit (INDEPENDENT_AMBULATORY_CARE_PROVIDER_SITE_OTHER): Payer: BC Managed Care – PPO | Admitting: Obstetrics & Gynecology

## 2023-01-13 ENCOUNTER — Telehealth: Payer: Self-pay | Admitting: *Deleted

## 2023-01-13 VITALS — BP 129/79 | HR 100 | Wt 226.0 lb

## 2023-01-13 DIAGNOSIS — M549 Dorsalgia, unspecified: Secondary | ICD-10-CM

## 2023-01-13 DIAGNOSIS — O10919 Unspecified pre-existing hypertension complicating pregnancy, unspecified trimester: Secondary | ICD-10-CM

## 2023-01-13 DIAGNOSIS — O34219 Maternal care for unspecified type scar from previous cesarean delivery: Secondary | ICD-10-CM

## 2023-01-13 DIAGNOSIS — O99210 Obesity complicating pregnancy, unspecified trimester: Secondary | ICD-10-CM

## 2023-01-13 DIAGNOSIS — O99891 Other specified diseases and conditions complicating pregnancy: Secondary | ICD-10-CM

## 2023-01-13 DIAGNOSIS — Z3A30 30 weeks gestation of pregnancy: Secondary | ICD-10-CM

## 2023-01-13 DIAGNOSIS — O0993 Supervision of high risk pregnancy, unspecified, third trimester: Secondary | ICD-10-CM

## 2023-01-13 MED ORDER — CYCLOBENZAPRINE HCL 10 MG PO TABS: 10.0000 mg | ORAL_TABLET | Freq: Three times a day (TID) | ORAL | 2 refills | Status: DC | PRN

## 2023-01-13 NOTE — Patient Instructions (Signed)
Return to office for any scheduled appointments. Call the office or go to the MAU at [facility] [place] if: You begin to have strong, frequent contractions Your water breaks.  Sometimes it is a big gush of fluid, sometimes it is just a trickle that keeps getting your underwear wet or running down your legs You have vaginal bleeding.  It is normal to have a small amount of spotting if your cervix was checked.  You do not feel your baby moving like normal.  If you do not, get something to eat and drink and lay down and focus on feeling your baby move.   If your baby is still not moving like normal, you should call the office or go to MAU. Any other obstetric concerns.  
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
## 2023-01-13 NOTE — Telephone Encounter (Signed)
Left patient a message to call and schedule rest of OB appointments through June.

## 2023-01-25 ENCOUNTER — Ambulatory Visit (INDEPENDENT_AMBULATORY_CARE_PROVIDER_SITE_OTHER): Payer: BC Managed Care – PPO | Admitting: Obstetrics and Gynecology

## 2023-01-25 VITALS — BP 135/88 | HR 103 | Wt 228.0 lb

## 2023-01-25 DIAGNOSIS — O10919 Unspecified pre-existing hypertension complicating pregnancy, unspecified trimester: Secondary | ICD-10-CM

## 2023-01-25 DIAGNOSIS — Z23 Encounter for immunization: Secondary | ICD-10-CM | POA: Diagnosis not present

## 2023-01-25 DIAGNOSIS — O0993 Supervision of high risk pregnancy, unspecified, third trimester: Secondary | ICD-10-CM

## 2023-01-25 DIAGNOSIS — O0992 Supervision of high risk pregnancy, unspecified, second trimester: Secondary | ICD-10-CM

## 2023-01-25 DIAGNOSIS — Z3A32 32 weeks gestation of pregnancy: Secondary | ICD-10-CM

## 2023-01-25 DIAGNOSIS — O10913 Unspecified pre-existing hypertension complicating pregnancy, third trimester: Secondary | ICD-10-CM
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
## 2023-01-26 ENCOUNTER — Ambulatory Visit: Payer: BC Managed Care – PPO | Admitting: *Deleted

## 2023-01-26 ENCOUNTER — Other Ambulatory Visit: Payer: Self-pay

## 2023-01-26 ENCOUNTER — Ambulatory Visit: Payer: BC Managed Care – PPO | Attending: Obstetrics

## 2023-01-26 VITALS — BP 127/75 | HR 104

## 2023-01-26 DIAGNOSIS — O10013 Pre-existing essential hypertension complicating pregnancy, third trimester: Secondary | ICD-10-CM | POA: Diagnosis not present

## 2023-01-26 DIAGNOSIS — O10919 Unspecified pre-existing hypertension complicating pregnancy, unspecified trimester: Secondary | ICD-10-CM | POA: Diagnosis not present

## 2023-01-26 DIAGNOSIS — O99213 Obesity complicating pregnancy, third trimester: Secondary | ICD-10-CM | POA: Insufficient documentation

## 2023-01-26 DIAGNOSIS — O0993 Supervision of high risk pregnancy, unspecified, third trimester: Secondary | ICD-10-CM

## 2023-01-26 DIAGNOSIS — O99210 Obesity complicating pregnancy, unspecified trimester: Secondary | ICD-10-CM | POA: Diagnosis present

## 2023-01-26 DIAGNOSIS — O09293 Supervision of pregnancy with other poor reproductive or obstetric history, third trimester: Secondary | ICD-10-CM

## 2023-01-26 DIAGNOSIS — O09299 Supervision of pregnancy with other poor reproductive or obstetric history, unspecified trimester: Secondary | ICD-10-CM | POA: Insufficient documentation

## 2023-01-26 DIAGNOSIS — O10913 Unspecified pre-existing hypertension complicating pregnancy, third trimester: Secondary | ICD-10-CM

## 2023-01-26 DIAGNOSIS — O34219 Maternal care for unspecified type scar from previous cesarean delivery: Secondary | ICD-10-CM | POA: Insufficient documentation

## 2023-01-26 DIAGNOSIS — E669 Obesity, unspecified: Secondary | ICD-10-CM

## 2023-01-26 DIAGNOSIS — Z6837 Body mass index (BMI) 37.0-37.9, adult: Secondary | ICD-10-CM | POA: Diagnosis present

## 2023-01-26 DIAGNOSIS — O99413 Diseases of the circulatory system complicating pregnancy, third trimester: Secondary | ICD-10-CM | POA: Diagnosis not present

## 2023-01-26 DIAGNOSIS — I493 Ventricular premature depolarization: Secondary | ICD-10-CM | POA: Insufficient documentation

## 2023-01-26 DIAGNOSIS — Z3A32 32 weeks gestation of pregnancy: Secondary | ICD-10-CM

## 2023-01-28 ENCOUNTER — Ambulatory Visit (INDEPENDENT_AMBULATORY_CARE_PROVIDER_SITE_OTHER): Payer: BC Managed Care – PPO

## 2023-01-28 DIAGNOSIS — Z0131 Encounter for examination of blood pressure with abnormal findings: Secondary | ICD-10-CM

## 2023-01-28 NOTE — Progress Notes (Signed)
Pt here for BP check. BP 124/63. Pt has no complaints today. Pt will return next week for NST/AFI.

## 2023-01-31 ENCOUNTER — Ambulatory Visit (INDEPENDENT_AMBULATORY_CARE_PROVIDER_SITE_OTHER): Payer: BC Managed Care – PPO

## 2023-01-31 ENCOUNTER — Ambulatory Visit (INDEPENDENT_AMBULATORY_CARE_PROVIDER_SITE_OTHER): Payer: BC Managed Care – PPO | Admitting: *Deleted

## 2023-01-31 DIAGNOSIS — O10913 Unspecified pre-existing hypertension complicating pregnancy, third trimester: Secondary | ICD-10-CM | POA: Diagnosis not present

## 2023-01-31 DIAGNOSIS — Z3A33 33 weeks gestation of pregnancy: Secondary | ICD-10-CM | POA: Diagnosis not present

## 2023-01-31 DIAGNOSIS — O0993 Supervision of high risk pregnancy, unspecified, third trimester: Secondary | ICD-10-CM

## 2023-01-31 DIAGNOSIS — O10919 Unspecified pre-existing hypertension complicating pregnancy, unspecified trimester: Secondary | ICD-10-CM

## 2023-02-03 ENCOUNTER — Ambulatory Visit (INDEPENDENT_AMBULATORY_CARE_PROVIDER_SITE_OTHER): Payer: BC Managed Care – PPO | Admitting: *Deleted

## 2023-02-03 ENCOUNTER — Telehealth: Payer: Self-pay | Admitting: *Deleted

## 2023-02-03 ENCOUNTER — Ambulatory Visit (INDEPENDENT_AMBULATORY_CARE_PROVIDER_SITE_OTHER): Payer: BC Managed Care – PPO | Admitting: Family Medicine

## 2023-02-03 VITALS — BP 152/90 | HR 102

## 2023-02-03 VITALS — Wt 228.0 lb

## 2023-02-03 DIAGNOSIS — B009 Herpesviral infection, unspecified: Secondary | ICD-10-CM | POA: Insufficient documentation

## 2023-02-03 DIAGNOSIS — Z98891 History of uterine scar from previous surgery: Secondary | ICD-10-CM

## 2023-02-03 DIAGNOSIS — Z3A33 33 weeks gestation of pregnancy: Secondary | ICD-10-CM

## 2023-02-03 DIAGNOSIS — O10013 Pre-existing essential hypertension complicating pregnancy, third trimester: Secondary | ICD-10-CM

## 2023-02-03 DIAGNOSIS — O0993 Supervision of high risk pregnancy, unspecified, third trimester: Secondary | ICD-10-CM

## 2023-02-03 DIAGNOSIS — O10919 Unspecified pre-existing hypertension complicating pregnancy, unspecified trimester: Secondary | ICD-10-CM

## 2023-02-03 DIAGNOSIS — O10913 Unspecified pre-existing hypertension complicating pregnancy, third trimester: Secondary | ICD-10-CM | POA: Diagnosis not present

## 2023-02-03 MED ORDER — BETAMETHASONE SOD PHOS & ACET 6 (3-3) MG/ML IJ SUSP
12.0000 mg | Freq: Once | INTRAMUSCULAR | Status: AC
  Administered 2023-02-03: 12 mg via INTRAMUSCULAR

## 2023-02-03 MED ORDER — NIFEDIPINE ER OSMOTIC RELEASE 30 MG PO TB24: 30.0000 mg | ORAL_TABLET | Freq: Every day | ORAL | 1 refills | Status: DC

## 2023-02-03 NOTE — Progress Notes (Signed)
NST- Reactive  BP-150/88 Retake 152/90  Pt put on Dr Tawni Levy schedule for today.
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
## 2023-02-03 NOTE — Telephone Encounter (Signed)
Pt called reporting her BP's after lunch today. 1:00 155/88 P-110 2:00 151/93 P-118 After taking Labetalol and Tylenol it is 138/93.  She does c/o of headache behind her Left eye but no visual changes.  Per Dr Shawnie Pons she should try drinking a coffee as caffeine can help with HA.  If her HA is persistent  with caffeine and Tylenol she should go on over to [facility] for evaluation.

## 2023-02-03 NOTE — Progress Notes (Addendum)
Pt is only taking Labetalol once a day BP 150/88 Repeat BP 152/90 Pt had a headache earlier today but, it is better now

## 2023-02-04 ENCOUNTER — Ambulatory Visit (INDEPENDENT_AMBULATORY_CARE_PROVIDER_SITE_OTHER): Payer: BC Managed Care – PPO | Admitting: *Deleted

## 2023-02-04 ENCOUNTER — Encounter: Payer: Self-pay | Admitting: Family Medicine

## 2023-02-04 VITALS — BP 138/80 | HR 115

## 2023-02-04 DIAGNOSIS — O10013 Pre-existing essential hypertension complicating pregnancy, third trimester: Secondary | ICD-10-CM

## 2023-02-04 DIAGNOSIS — Z3A33 33 weeks gestation of pregnancy: Secondary | ICD-10-CM

## 2023-02-04 DIAGNOSIS — O0993 Supervision of high risk pregnancy, unspecified, third trimester: Secondary | ICD-10-CM

## 2023-02-04 LAB — COMPREHENSIVE METABOLIC PANEL
ALT: 13 IU/L (ref 0–32)
AST: 13 IU/L (ref 0–40)
Albumin/Globulin Ratio: 1.4 (ref 1.2–2.2)
Albumin: 3.7 g/dL — ABNORMAL LOW (ref 4.0–5.0)
Alkaline Phosphatase: 105 IU/L (ref 44–121)
BUN/Creatinine Ratio: 9 (ref 9–23)
BUN: 4 mg/dL — ABNORMAL LOW (ref 6–20)
Bilirubin Total: <0.2 mg/dL (ref 0.0–1.2)
CO2: 19 mmol/L — ABNORMAL LOW (ref 20–29)
Calcium: 9.3 mg/dL (ref 8.7–10.2)
Chloride: 103 mmol/L (ref 96–106)
Creatinine, Ser: 0.44 mg/dL — ABNORMAL LOW (ref 0.57–1.00)
Globulin, Total: 2.6 g/dL (ref 1.5–4.5)
Glucose: 64 mg/dL — ABNORMAL LOW (ref 70–99)
Potassium: 4.4 mmol/L (ref 3.5–5.2)
Sodium: 137 mmol/L (ref 134–144)
Total Protein: 6.3 g/dL (ref 6.0–8.5)
eGFR: 134 mL/min/{1.73_m2} (ref >59)

## 2023-02-04 LAB — CBC
Hematocrit: 37.6 % (ref 34.0–46.6)
Hemoglobin: 12.4 g/dL (ref 11.1–15.9)
MCH: 28.8 pg (ref 26.6–33.0)
MCHC: 33 g/dL (ref 31.5–35.7)
MCV: 87 fL (ref 79–97)
Platelets: 270 10*3/uL (ref 150–450)
RBC: 4.31 x10E6/uL (ref 3.77–5.28)
RDW: 13.3 % (ref 11.7–15.4)
WBC: 12 10*3/uL — ABNORMAL HIGH (ref 3.4–10.8)

## 2023-02-04 LAB — PROTEIN / CREATININE RATIO, URINE
Creatinine, Urine: 34.1 mg/dL
Protein, Ur: 4.9 mg/dL
Protein/Creat Ratio: 144 mg/g creat (ref 0–200)

## 2023-02-04 MED ORDER — BETAMETHASONE SOD PHOS & ACET 6 (3-3) MG/ML IJ SUSP
12.0000 mg | Freq: Once | INTRAMUSCULAR | Status: AC
  Administered 2023-02-04: 12 mg via INTRAMUSCULAR

## 2023-02-04 NOTE — Progress Notes (Signed)
Pt here today for 2nd Betamethasone injection and BP today in office is 138/80  P-115.  Pt states that she has only a slight headache but no other symptoms.  She has not picked up her Procardia as of today.

## 2023-02-04 NOTE — Progress Notes (Signed)
Pt her for 2nd Betamethasone injection and BP check.  BP in office today is 138/80 P-115.  Pt states that she has a slight headache but no other symptoms.  She has not started her procardia as of this AM.

## 2023-02-10 ENCOUNTER — Ambulatory Visit (INDEPENDENT_AMBULATORY_CARE_PROVIDER_SITE_OTHER): Payer: BC Managed Care – PPO | Admitting: Family Medicine

## 2023-02-10 ENCOUNTER — Other Ambulatory Visit: Payer: BC Managed Care – PPO

## 2023-02-10 VITALS — BP 139/87 | HR 112 | Wt 232.0 lb

## 2023-02-10 DIAGNOSIS — G43109 Migraine with aura, not intractable, without status migrainosus: Secondary | ICD-10-CM

## 2023-02-10 DIAGNOSIS — Z98891 History of uterine scar from previous surgery: Secondary | ICD-10-CM

## 2023-02-10 DIAGNOSIS — O10919 Unspecified pre-existing hypertension complicating pregnancy, unspecified trimester: Secondary | ICD-10-CM

## 2023-02-10 DIAGNOSIS — Z3A34 34 weeks gestation of pregnancy: Secondary | ICD-10-CM

## 2023-02-10 DIAGNOSIS — O0993 Supervision of high risk pregnancy, unspecified, third trimester: Secondary | ICD-10-CM
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
## 2023-02-11 ENCOUNTER — Encounter (HOSPITAL_COMMUNITY): Payer: Self-pay | Admitting: Obstetrics and Gynecology

## 2023-02-11 ENCOUNTER — Inpatient Hospital Stay (HOSPITAL_COMMUNITY)
Admission: AD | Admit: 2023-02-11 | Discharge: 2023-02-11 | Disposition: A | Discharge status: Home / Self Care | Payer: BC Managed Care – PPO | Attending: Obstetrics and Gynecology | Admitting: Obstetrics and Gynecology

## 2023-02-11 ENCOUNTER — Other Ambulatory Visit: Payer: Self-pay

## 2023-02-11 DIAGNOSIS — R519 Headache, unspecified: Secondary | ICD-10-CM | POA: Insufficient documentation

## 2023-02-11 DIAGNOSIS — O99891 Other specified diseases and conditions complicating pregnancy: Secondary | ICD-10-CM | POA: Diagnosis present

## 2023-02-11 DIAGNOSIS — O34219 Maternal care for unspecified type scar from previous cesarean delivery: Secondary | ICD-10-CM | POA: Insufficient documentation

## 2023-02-11 DIAGNOSIS — Z8679 Personal history of other diseases of the circulatory system: Secondary | ICD-10-CM | POA: Diagnosis not present

## 2023-02-11 DIAGNOSIS — R079 Chest pain, unspecified: Secondary | ICD-10-CM | POA: Insufficient documentation

## 2023-02-11 DIAGNOSIS — M7918 Myalgia, other site: Secondary | ICD-10-CM

## 2023-02-11 DIAGNOSIS — O99343 Other mental disorders complicating pregnancy, third trimester: Secondary | ICD-10-CM | POA: Diagnosis not present

## 2023-02-11 DIAGNOSIS — O26893 Other specified pregnancy related conditions, third trimester: Secondary | ICD-10-CM | POA: Insufficient documentation

## 2023-02-11 DIAGNOSIS — O09293 Supervision of pregnancy with other poor reproductive or obstetric history, third trimester: Secondary | ICD-10-CM | POA: Insufficient documentation

## 2023-02-11 DIAGNOSIS — O479 False labor, unspecified: Secondary | ICD-10-CM

## 2023-02-11 DIAGNOSIS — Z3A34 34 weeks gestation of pregnancy: Secondary | ICD-10-CM | POA: Insufficient documentation

## 2023-02-11 DIAGNOSIS — O10919 Unspecified pre-existing hypertension complicating pregnancy, unspecified trimester: Secondary | ICD-10-CM

## 2023-02-11 DIAGNOSIS — O10913 Unspecified pre-existing hypertension complicating pregnancy, third trimester: Secondary | ICD-10-CM | POA: Insufficient documentation

## 2023-02-11 HISTORY — DX: Unspecified ovarian cyst, unspecified side: N83.209

## 2023-02-11 HISTORY — DX: Unspecified abnormal cytological findings in specimens from vagina: R87.629

## 2023-02-11 HISTORY — DX: Essential (primary) hypertension: I10

## 2023-02-11 HISTORY — DX: Urinary tract infection, site not specified: N39.0

## 2023-02-11 HISTORY — DX: Hypoglycemia, unspecified: E16.2

## 2023-02-11 LAB — URINALYSIS, ROUTINE W REFLEX MICROSCOPIC
Bilirubin Urine: NEGATIVE
Glucose, UA: NEGATIVE mg/dL
Hgb urine dipstick: NEGATIVE
Ketones, ur: NEGATIVE mg/dL
Leukocytes,Ua: NEGATIVE
Nitrite: NEGATIVE
Protein, ur: NEGATIVE mg/dL
Specific Gravity, Urine: 1.014 (ref 1.005–1.030)
pH: 6 (ref 5.0–8.0)

## 2023-02-11 LAB — PROTEIN / CREATININE RATIO, URINE
Creatinine, Urine: 91 mg/dL
Protein Creatinine Ratio: 0.09 mg/mg{Cre} (ref 0.00–0.15)
Total Protein, Urine: 8 mg/dL

## 2023-02-11 LAB — CBC
HCT: 34.2 % — ABNORMAL LOW (ref 36.0–46.0)
Hemoglobin: 11.5 g/dL — ABNORMAL LOW (ref 12.0–15.0)
MCH: 28.9 pg (ref 26.0–34.0)
MCHC: 33.6 g/dL (ref 30.0–36.0)
MCV: 85.9 fL (ref 80.0–100.0)
Platelets: 242 10*3/uL (ref 150–400)
RBC: 3.98 MIL/uL (ref 3.87–5.11)
RDW: 13.8 % (ref 11.5–15.5)
WBC: 14.6 10*3/uL — ABNORMAL HIGH (ref 4.0–10.5)
nRBC: 0 % (ref 0.0–0.2)

## 2023-02-11 LAB — COMPREHENSIVE METABOLIC PANEL
ALT: 15 U/L (ref 0–44)
AST: 16 U/L (ref 15–41)
Albumin: 2.7 g/dL — ABNORMAL LOW (ref 3.5–5.0)
Alkaline Phosphatase: 75 U/L (ref 38–126)
Anion gap: 12 (ref 5–15)
BUN: 8 mg/dL (ref 6–20)
CO2: 22 mmol/L (ref 22–32)
Calcium: 9.2 mg/dL (ref 8.9–10.3)
Chloride: 101 mmol/L (ref 98–111)
Creatinine, Ser: 0.49 mg/dL (ref 0.44–1.00)
GFR, Estimated: >60 mL/min (ref >60)
Glucose, Bld: 84 mg/dL (ref 70–99)
Potassium: 4.2 mmol/L (ref 3.5–5.1)
Sodium: 135 mmol/L (ref 135–145)
Total Bilirubin: 0.4 mg/dL (ref 0.3–1.2)
Total Protein: 5.8 g/dL — ABNORMAL LOW (ref 6.5–8.1)

## 2023-02-11 LAB — TROPONIN I (HIGH SENSITIVITY): Troponin I (High Sensitivity): 5 ng/L (ref <18)

## 2023-02-11 MED ORDER — CAFFEINE 200 MG PO TABS
200.0000 mg | ORAL_TABLET | Freq: Once | ORAL | Status: AC
  Administered 2023-02-11: 200 mg via ORAL
  Filled 2023-02-11: qty 1

## 2023-02-11 NOTE — MAU Note (Signed)
Susan Krause is a 32 y.o. at [redacted]w[redacted]d here in MAU reporting: she has chest pain located "behind my boobs" that began approximately 3-4 hours ago.  States pain is constant and fells both heavy and sharp. Reports had a HA earlier, took Tylenol & HA relieved.   Denies visual disturbances & epigastric pain.   Denies VB or LOF.  Endorses +FM. LMP: NA Onset of complaint: today Pain score: 7 Vitals:   02/11/23 1801  BP: 136/85  Pulse: (!) 110  Resp: 19  Temp: 98.1 F (36.7 C)  SpO2: 99%     FHT:163 bpm, FM audible Lab orders placed from triage:

## 2023-02-11 NOTE — MAU Provider Note (Signed)
History     CSN: [id number]  Arrival date and time: 02/11/23 1747   None     Chief Complaint  Patient presents with   Chest Pain   HPI Susan Krause is a 32 y.o. [id number] at [redacted]w[redacted]d who presents to MAU for chest pain and headache. She reports her head started to hurt earlier today which resolved with Tylenol, however is starting to come back. She currently rates her headache 4/10.   She reports her chest has been hurting for the past few hours. She is rating her pain 7/10. Pain is constant and located behind both her breasts. It is not midsternal. She reports that "if I'm not relaxed it makes the pain worse". She has not taken anything to relieve the pain. She reports she has had costochondritis twice in the past which occurs "when I'm really stressed out" and the pain somewhat feels similar. She also has a history of PVCs in which she takes Labetalol, however does not feel this is the cause of her pain. She denies shortness of breath or palpitations. No vision changes or RUQ/epigastric pain. She reports that she is worried about pre-eclampsia given her history of high blood pressure.   Pregnancy course: previous c/s x1, hx of cHTN (Labetalol), hx PVCs, hx of migraines. Receives [facility] at [facility], next appointment is on 6/4.  OB History     Gravida  6   Para  1   Term  1   Preterm      AB  4   Living  1      SAB  2   IAB      Ectopic  2   Multiple  0   Live Births  1        Obstetric Comments  C/s for fail to descent, pushed for 3 hrs         Past Medical History:  Diagnosis Date   ADHD (attention deficit hyperactivity disorder)    No meds   Cervical dysplasia    Chlamydia    Head injury with loss of consciousness (HCC) 03/2020   Hypertension    Hypoglycemia    Migraine    Propanolol 10 mg   OCD (obsessive compulsive disorder)    Ovarian cyst    PTSD (post-traumatic stress disorder)    no meds   PVC (premature ventricular contraction)    PVC's (premature  ventricular contractions) 2018   has palpations when she has PV's.   Sinus tachycardia    UTI (urinary tract infection)    Vaginal Pap smear, abnormal     Past Surgical History:  Procedure Laterality Date   CESAREAN SECTION N/A 07/18/2018   Procedure: CESAREAN SECTION;  Surgeon: Essie Hart, MD;  Location: [facility] BIRTHING SUITES;  Service: Obstetrics;  Laterality: N/A;   DIAGNOSTIC LAPAROSCOPY WITH REMOVAL OF ECTOPIC PREGNANCY Left 12/20/2020   Procedure: LAPAROSCOPIC LEFT SALPINGECTOMY WITH REMOVAL OF ECTOPIC PREGNANCY;  Surgeon: [name], MD;  Location: [facility] OR;  Service: Gynecology;  Laterality: Left;   DILATION AND CURETTAGE OF UTERUS N/A 07/01/2021   Procedure: DILATATION AND CURETTAGE WITH PATHOLOGY;  Surgeon: Milas Hock, MD;  Location: [facility] OR;  Service: Gynecology;  Laterality: N/A;   SALPINGECTOMY Left 12/2020   WISDOM TOOTH EXTRACTION      Family History  Problem Relation Age of Onset   Diabetes Mother    Cancer Mother        stage 2 breast   ADD / ADHD Mother  Anxiety disorder Mother    Depression Mother    Hyperlipidemia Mother    Obesity Mother    Depression Father    ADD / ADHD Father    Anxiety disorder Sister    Asthma Sister    Depression Sister    ADD / ADHD Sister    Intellectual disability Brother    Learning disabilities Brother    Anxiety disorder Maternal Aunt    Diabetes Maternal Aunt    Alcohol abuse Maternal Uncle    Hyperlipidemia Maternal Uncle    Anxiety disorder Paternal Uncle    Diabetes Paternal Uncle    Anxiety disorder Maternal Grandmother    Diabetes Maternal Grandmother    Hyperlipidemia Maternal Grandmother    Varicose Veins Maternal Grandmother    Cancer Maternal Grandfather    Hyperlipidemia Maternal Grandfather    Obesity Maternal Grandfather    Vision loss Maternal Grandfather    Cancer Paternal Grandmother     Social History   Tobacco Use   Smoking status: Never   Smokeless tobacco: Never  Vaping Use   Vaping  Use: Never used  Substance Use Topics   Alcohol use: No   Drug use: No    Allergies:  Allergies  Allergen Reactions   Banana Swelling   Latex Swelling   Peanut-Containing Drug Products Anaphylaxis   Hpv 4-Valent Vaccine Recombinant Vaccine Other (See Comments)   Hpv Bival (Type 16,18) Recomb Vaccine  [Human Papillomavirus (16,18) Recomb Vac] Hives   Iodinated Contrast Media Diarrhea, Palpitations and Other (See Comments)   Peanut Oil Swelling    Tongue swells/itching   Pumpkin Flavor Swelling   Milk (Cow) Other (See Comments)   Lactose Intolerance (Gi) Other (See Comments)    Gi upset    No medications prior to admission.   Review of Systems  Constitutional: Negative.   Respiratory: Negative.    Cardiovascular:  Positive for chest pain.  Neurological:  Positive for headaches.  All other systems reviewed and are negative.  Physical Exam   Patient Vitals for the past 24 hrs:  BP Temp Temp src Pulse Resp SpO2 Height Weight  02/11/23 2030 (!) 144/91 -- -- (!) 103 -- 98 % -- --  02/11/23 2015 (!) 141/85 -- -- (!) 105 -- 97 % -- --  02/11/23 2000 (!) 139/91 -- -- (!) 101 -- 99 % -- --  02/11/23 1945 129/82 -- -- 97 -- 98 % -- --  02/11/23 1930 (!) 137/95 -- -- (!) 104 -- 99 % -- --  02/11/23 1900 119/74 -- -- (!) 102 -- 98 % -- --  02/11/23 1845 120/77 -- -- (!) 102 -- 97 % -- --  02/11/23 1830 117/72 -- -- 98 -- 97 % -- --  02/11/23 1827 119/74 -- -- (!) 105 -- -- -- --  02/11/23 1825 -- -- -- -- -- 98 % -- --  02/11/23 1801 136/85 98.1 F (36.7 C) Oral (!) 110 19 99 % -- --  02/11/23 1755 -- -- -- -- -- -- 5\' 2"  (1.575 m) 104.7 kg   Physical Exam Vitals and nursing note reviewed.  Constitutional:      General: She is not in acute distress.    Appearance: She is obese.  Eyes:     Extraocular Movements: Extraocular movements intact.     Pupils: Pupils are equal, round, and reactive to light.  Cardiovascular:     Rate and Rhythm: Regular rhythm. Tachycardia  present.     Heart sounds: Normal  heart sounds.  Pulmonary:     Effort: Pulmonary effort is normal. No respiratory distress.     Breath sounds: Normal breath sounds.  Abdominal:     Palpations: Abdomen is soft.     Tenderness: There is no abdominal tenderness.     Comments: Gravid    Musculoskeletal:        General: Normal range of motion.     Cervical back: Normal range of motion.  Skin:    General: Skin is warm and dry.  Neurological:     General: No focal deficit present.     Mental Status: She is alert and oriented to person, place, and time.     Cranial Nerves: No cranial nerve deficit.  Psychiatric:        Mood and Affect: Mood normal.        Behavior: Behavior normal.   Dilation: Fingertip Effacement (%): Thick Exam by:: Camelia Eng, CNM  NST FHR: 140 bpm, moderate variability, +15x15 accels, no decels Toco: Q 2-9 mins  Results for orders placed or performed during the hospital encounter of 02/11/23 (from the past 24 hour(s))  Urinalysis, Routine w reflex microscopic -Urine, Clean Catch     Status: None   Collection Time: 02/11/23  6:49 PM  Result Value Ref Range   Color, Urine YELLOW YELLOW   APPearance CLEAR CLEAR   Specific Gravity, Urine 1.014 1.005 - 1.030   pH 6.0 5.0 - 8.0   Glucose, UA NEGATIVE NEGATIVE mg/dL   Hgb urine dipstick NEGATIVE NEGATIVE   Bilirubin Urine NEGATIVE NEGATIVE   Ketones, ur NEGATIVE NEGATIVE mg/dL   Protein, ur NEGATIVE NEGATIVE mg/dL   Nitrite NEGATIVE NEGATIVE   Leukocytes,Ua NEGATIVE NEGATIVE  Protein / creatinine ratio, urine     Status: None   Collection Time: 02/11/23  6:49 PM  Result Value Ref Range   Creatinine, Urine 91 mg/dL   Total Protein, Urine 8 mg/dL   Protein Creatinine Ratio 0.09 0.00 - 0.15 mg/mg[Cre]  CBC     Status: Abnormal   Collection Time: 02/11/23  6:54 PM  Result Value Ref Range   WBC 14.6 (H) 4.0 - 10.5 K/uL   RBC 3.98 3.87 - 5.11 MIL/uL   Hemoglobin 11.5 (L) 12.0 - 15.0 g/dL   HCT 40.9 (L)  81.1 - 46.0 %   MCV 85.9 80.0 - 100.0 fL   MCH 28.9 26.0 - 34.0 pg   MCHC 33.6 30.0 - 36.0 g/dL   RDW 91.4 78.2 - 95.6 %   Platelets 242 150 - 400 K/uL   nRBC 0.0 0.0 - 0.2 %  Comprehensive metabolic panel     Status: Abnormal   Collection Time: 02/11/23  6:54 PM  Result Value Ref Range   Sodium 135 135 - 145 mmol/L   Potassium 4.2 3.5 - 5.1 mmol/L   Chloride 101 98 - 111 mmol/L   CO2 22 22 - 32 mmol/L   Glucose, Bld 84 70 - 99 mg/dL   BUN 8 6 - 20 mg/dL   Creatinine, Ser 2.13 0.44 - 1.00 mg/dL   Calcium 9.2 8.9 - 08.6 mg/dL   Total Protein 5.8 (L) 6.5 - 8.1 g/dL   Albumin 2.7 (L) 3.5 - 5.0 g/dL   AST 16 15 - 41 U/L   ALT 15 0 - 44 U/L   Alkaline Phosphatase 75 38 - 126 U/L   Total Bilirubin 0.4 0.3 - 1.2 mg/dL   GFR, Estimated >57 >84 mL/min   Anion gap 12  5 - 15  Troponin I (High Sensitivity)     Status: None   Collection Time: 02/11/23  6:54 PM  Result Value Ref Range   Troponin I (High Sensitivity) 5 <18 ng/L    MAU Course  Procedures  MDM UA CBC, CMP, UPCR, Troponin EKG Caffeine PO   UA and labs unremarkable. Troponin negative. EKG reviewed with Dr. Ladon Applebaum -reassuring EKG. Patient sitting comfortably in room. Vital signs are stable, BP's normotensive to mildly elevated. HR elevated in the low 100s however this is patient's norm. O2 sats >99% on RA. Low suspicion for acute cardiopulmonary event. I suspect pain is MSK in nature. She was offered Flexeril, however she drove herself and does not have a ride home.   Patient given PO caffeine for headache which resolved headache.  While labs were pending, she started having irregular mild contractions. Cervix closed/thick. She was offered PO Procardia but declines. Terbutaline not offered given pulse >100. Encouraged PO hydration.   On reassessment, pain behind breasts has improved and minimal. Headache resolved. Contractions are mildly crampy. She was offered another cervical exam but declines. She is requesting  discharge home as pre-eclampsia has been ruled out, pain has improved, and she has not eaten since 1230pm.     Assessment and Plan   1. [redacted] weeks gestation of pregnancy   2. Chronic hypertension affecting pregnancy   3. Musculoskeletal pain   4. Headache in pregnancy, antepartum, third trimester   5. Uterine contractions    - Discharge home in stable condition  - Strict return precautions given. Return to MAU as needed - Patient may use Flexeril rx for headache or pain behind both breasts - Keep appointment as scheduled on Tuesday  Brand Males, CNM 02/11/2023, 9:24 PM

## 2023-02-15 ENCOUNTER — Ambulatory Visit (INDEPENDENT_AMBULATORY_CARE_PROVIDER_SITE_OTHER): Payer: BC Managed Care – PPO | Admitting: *Deleted

## 2023-02-15 ENCOUNTER — Other Ambulatory Visit (INDEPENDENT_AMBULATORY_CARE_PROVIDER_SITE_OTHER): Payer: BC Managed Care – PPO

## 2023-02-15 VITALS — BP 123/83 | HR 111 | Ht 62.0 in | Wt 233.0 lb

## 2023-02-15 DIAGNOSIS — O10919 Unspecified pre-existing hypertension complicating pregnancy, unspecified trimester: Secondary | ICD-10-CM

## 2023-02-15 DIAGNOSIS — O10013 Pre-existing essential hypertension complicating pregnancy, third trimester: Secondary | ICD-10-CM | POA: Diagnosis not present

## 2023-02-15 DIAGNOSIS — Z3A35 35 weeks gestation of pregnancy: Secondary | ICD-10-CM

## 2023-02-15 DIAGNOSIS — O0993 Supervision of high risk pregnancy, unspecified, third trimester: Secondary | ICD-10-CM

## 2023-02-15 NOTE — Progress Notes (Signed)
AFI 15.65 WNL  NST-Reactive

## 2023-02-16 ENCOUNTER — Encounter (HOSPITAL_COMMUNITY): Payer: Self-pay | Admitting: Obstetrics and Gynecology

## 2023-02-16 ENCOUNTER — Inpatient Hospital Stay (HOSPITAL_COMMUNITY)
Admission: AD | Admit: 2023-02-16 | Discharge: 2023-02-17 | Disposition: A | Discharge status: Home / Self Care | Payer: BC Managed Care – PPO | Attending: Obstetrics and Gynecology | Admitting: Obstetrics and Gynecology

## 2023-02-16 DIAGNOSIS — O99353 Diseases of the nervous system complicating pregnancy, third trimester: Secondary | ICD-10-CM | POA: Insufficient documentation

## 2023-02-16 DIAGNOSIS — O34219 Maternal care for unspecified type scar from previous cesarean delivery: Secondary | ICD-10-CM | POA: Diagnosis not present

## 2023-02-16 DIAGNOSIS — O10913 Unspecified pre-existing hypertension complicating pregnancy, third trimester: Secondary | ICD-10-CM | POA: Insufficient documentation

## 2023-02-16 DIAGNOSIS — G43909 Migraine, unspecified, not intractable, without status migrainosus: Secondary | ICD-10-CM | POA: Insufficient documentation

## 2023-02-16 DIAGNOSIS — O4703 False labor before 37 completed weeks of gestation, third trimester: Secondary | ICD-10-CM | POA: Diagnosis not present

## 2023-02-16 DIAGNOSIS — Z79899 Other long term (current) drug therapy: Secondary | ICD-10-CM | POA: Diagnosis not present

## 2023-02-16 DIAGNOSIS — O10919 Unspecified pre-existing hypertension complicating pregnancy, unspecified trimester: Secondary | ICD-10-CM

## 2023-02-16 DIAGNOSIS — Z3A35 35 weeks gestation of pregnancy: Secondary | ICD-10-CM | POA: Diagnosis not present

## 2023-02-16 LAB — PROTEIN / CREATININE RATIO, URINE
Creatinine, Urine: 89 mg/dL
Protein Creatinine Ratio: 0.1 mg/mg{Cre} (ref 0.00–0.15)
Total Protein, Urine: 9 mg/dL

## 2023-02-16 MED ORDER — ONDANSETRON 4 MG PO TBDP
4.0000 mg | ORAL_TABLET | Freq: Once | ORAL | Status: AC
  Administered 2023-02-16: 4 mg via ORAL
  Filled 2023-02-16: qty 1

## 2023-02-16 NOTE — MAU Note (Signed)
..  Susan Krause is a 32 y.o. at [redacted]w[redacted]d here in MAU reporting: contractions for the past two hours, has not timed them because she thought they were braxton hicks but have gotten more intense. +FM. Denies vaginal bleeding or leaking of fluid.  blood pressure at home 156/103; 160/106 Denies h/a visual changes or epigastric pain.  Reports induction on 02/28/2023 for BP, takes aspirin and labetalol. Last dose was last night Pain score: 7/10 Vitals:   02/16/23 2231  BP: (!) 157/98  Pulse: (!) 122  Resp: 20  Temp: 98 F (36.7 C)  SpO2: 98%     FHT:133

## 2023-02-16 NOTE — MAU Provider Note (Signed)
Chief Complaint:  No chief complaint on file.   Event Date/Time   First Provider Initiated Contact with Patient 02/16/23 2336     HPI: Susan Krause is a 32 y.o. [id number] at 24w2dwho presents to maternity admissions reporting painful contractions and elevated blood pressure.  Has had Betamethasone for preterm labor in May  Is taking Labetalol 100mg  once a day for migraines. Was prescribed Procardia but is not taking it.  . She reports good fetal movement, denies LOF, vaginal bleeding, vaginal itching/burning, urinary symptoms, h/a, dizziness, n/v, diarrhea, constipation or fever/chills.  She denies headache, visual changes or RUQ abdominal pain.  Abdominal Pain The current episode started today. The problem occurs intermittently. The pain is moderate. The quality of the pain is cramping. The abdominal pain does not radiate. Associated symptoms include nausea. Pertinent negatives include no constipation, diarrhea, dysuria, myalgias or vomiting. Nothing aggravates the pain. The pain is relieved by Nothing. She has tried nothing for the symptoms.   RN Note: Marland KitchenMarland KitchenLanea Krause is a 32 y.o. at [redacted]w[redacted]d here in MAU reporting: contractions for the past two hours, has not timed them because she thought they were braxton hicks but have gotten more intense. +FM. Denies vaginal bleeding or leaking of fluid.  blood pressure at home 156/103; 160/106 Denies h/a visual changes or epigastric pain.  Reports induction on 02/28/2023 for BP, takes aspirin and labetalol. Last dose was last night Pain score: 7/10  Past Medical History: Past Medical History:  Diagnosis Date   ADHD (attention deficit hyperactivity disorder)    No meds   Cervical dysplasia    Chlamydia    Head injury with loss of consciousness (HCC) 03/2020   Hypertension    Hypoglycemia    Migraine    Propanolol 10 mg   OCD (obsessive compulsive disorder)    Ovarian cyst    PTSD (post-traumatic stress disorder)    no meds   PVC (premature ventricular  contraction)    PVC's (premature ventricular contractions) 2018   has palpations when she has PV's.   Sinus tachycardia    UTI (urinary tract infection)    Vaginal Pap smear, abnormal     Past obstetric history: OB History  Gravida Para Term Preterm AB Living  6 1 1   4 1   SAB IAB Ectopic Multiple Live Births  2   2 0 1    # Outcome Date GA Lbr Len/2nd Weight Sex Delivery Anes PTL Lv  6 Current           5 SAB 12/2021          4 Ectopic 06/2021          3 SAB 05/2021          2 Ectopic 12/20/20 [redacted]w[redacted]d         1 Term 07/18/18 [redacted]w[redacted]d / 04:17 3715 g M CS-Vac EPI  LIV    Obstetric Comments  C/s for fail to descent, pushed for 3 hrs    Past Surgical History: Past Surgical History:  Procedure Laterality Date   CESAREAN SECTION N/A 07/18/2018   Procedure: CESAREAN SECTION;  Surgeon: Essie Hart, MD;  Location: [facility] BIRTHING SUITES;  Service: Obstetrics;  Laterality: N/A;   DIAGNOSTIC LAPAROSCOPY WITH REMOVAL OF ECTOPIC PREGNANCY Left 12/20/2020   Procedure: LAPAROSCOPIC LEFT SALPINGECTOMY WITH REMOVAL OF ECTOPIC PREGNANCY;  Surgeon: [name], MD;  Location: [facility] OR;  Service: Gynecology;  Laterality: Left;   DILATION AND CURETTAGE OF UTERUS N/A 07/01/2021   Procedure: DILATATION  AND CURETTAGE WITH PATHOLOGY;  Surgeon: Milas Hock, MD;  Location: [facility] OR;  Service: Gynecology;  Laterality: N/A;   SALPINGECTOMY Left 12/2020   WISDOM TOOTH EXTRACTION      Family History: Family History  Problem Relation Age of Onset   Diabetes Mother    Cancer Mother        stage 2 breast   ADD / ADHD Mother    Anxiety disorder Mother    Depression Mother    Hyperlipidemia Mother    Obesity Mother    Depression Father    ADD / ADHD Father    Anxiety disorder Sister    Asthma Sister    Depression Sister    ADD / ADHD Sister    Intellectual disability Brother    Learning disabilities Brother    Anxiety disorder Maternal Aunt    Diabetes Maternal Aunt    Alcohol abuse Maternal Uncle     Hyperlipidemia Maternal Uncle    Anxiety disorder Paternal Uncle    Diabetes Paternal Uncle    Anxiety disorder Maternal Grandmother    Diabetes Maternal Grandmother    Hyperlipidemia Maternal Grandmother    Varicose Veins Maternal Grandmother    Cancer Maternal Grandfather    Hyperlipidemia Maternal Grandfather    Obesity Maternal Grandfather    Vision loss Maternal Grandfather    Cancer Paternal Grandmother     Social History: Social History   Tobacco Use   Smoking status: Never   Smokeless tobacco: Never  Vaping Use   Vaping Use: Never used  Substance Use Topics   Alcohol use: No   Drug use: No    Allergies:  Allergies  Allergen Reactions   Banana Swelling   Latex Swelling   Peanut-Containing Drug Products Anaphylaxis   Hpv 4-Valent Vaccine Recombinant Vaccine Other (See Comments)   Hpv Bival (Type 16,18) Recomb Vaccine  [Human Papillomavirus (16,18) Recomb Vac] Hives   Iodinated Contrast Media Diarrhea, Palpitations and Other (See Comments)   Peanut Oil Swelling    Tongue swells/itching   Pumpkin Flavor Swelling   Milk (Cow) Other (See Comments)   Lactose Intolerance (Gi) Other (See Comments)    Gi upset    Meds:  Medications Prior to Admission  Medication Sig Dispense Refill Last Dose   aspirin EC 81 MG tablet Take 1 tablet (81 mg total) by mouth daily. Swallow whole. 30 tablet 12 02/15/2023   labetalol (NORMODYNE) 100 MG tablet Take 100 mg by mouth daily.   02/15/2023   Prenatal Multivit-Min-Fe-FA (PRENATAL 1 + IRON PO)    02/15/2023   acetaminophen (TYLENOL) 500 MG tablet Take 1,000 mg by mouth every 6 (six) hours as needed.      cetirizine (ZYRTEC) 10 MG chewable tablet Chew 10 mg by mouth daily.      cyclobenzaprine (FLEXERIL) 10 MG tablet Take 1 tablet (10 mg total) by mouth 3 (three) times daily as needed for muscle spasms. (Patient not taking: Reported on 02/10/2023) 30 tablet 2    famotidine (PEPCID) 20 MG tablet Take 20 mg by mouth 2 (two) times daily.       NIFEdipine (PROCARDIA XL) 30 MG 24 hr tablet Take 1 tablet (30 mg total) by mouth daily. (Patient not taking: Reported on 02/10/2023) 30 tablet 1     I have reviewed patient's Past Medical Hx, Surgical Hx, Family Hx, Social Hx, medications and allergies.   ROS:  Review of Systems  Gastrointestinal:  Positive for abdominal pain and nausea. Negative for constipation, diarrhea  and vomiting.  Genitourinary:  Negative for dysuria.  Musculoskeletal:  Negative for myalgias.   Other systems negative  Physical Exam  Patient Vitals for the past 24 hrs:  BP Temp Temp src Pulse Resp SpO2 Height Weight  02/16/23 2330 (!) 132/93 -- -- (!) 118 -- 99 % -- --  02/16/23 2325 -- -- -- -- -- 100 % -- --  02/16/23 2320 -- -- -- -- -- 100 % -- --  02/16/23 2315 (!) 142/100 -- -- (!) 119 -- 100 % -- --  02/16/23 2310 -- -- -- -- -- 99 % -- --  02/16/23 2305 -- -- -- -- -- 98 % -- --  02/16/23 2300 137/76 -- -- (!) 118 -- -- -- --  02/16/23 2259 -- -- -- -- -- 99 % -- --  02/16/23 2255 -- -- -- -- -- 99 % -- --  02/16/23 2250 -- -- -- -- -- 98 % -- --  02/16/23 2245 (!) 143/93 -- -- (!) 119 -- -- -- --  02/16/23 2231 (!) 157/98 98 F (36.7 C) Oral (!) 122 20 98 % 5\' 2"  (1.575 m) 105.5 kg   Vitals:   02/17/23 0030 02/17/23 0035 02/17/23 0040 02/17/23 0045  BP: (!) 143/88     Pulse: (!) 113     Resp:      Temp:      TempSrc:      SpO2: 98% 98% 96% 99%  Weight:      Height:        Constitutional: Well-developed, well-nourished female in no acute distress.  Cardiovascular: normal rate and rhythm Respiratory: normal effort GI: Abd soft, non-tender, gravid appropriate for gestational age.   No rebound or guarding. MS: Extremities nontender, no edema, normal ROM Neurologic: Alert and oriented x 4.  GU: Neg CVAT.  PELVIC EXAM:  Dilation: 1 Effacement (%): Thick Station: -3 Exam by:: Artelia Laroche CNM  Repeated one hour later:   Unchanged  FHT:  Baseline 145 , moderate variability,  accelerations present, no decelerations Contractions: Irregular     Labs: Results for orders placed or performed during the hospital encounter of 02/16/23 (from the past 24 hour(s))  Protein / creatinine ratio, urine     Status: None   Collection Time: 02/16/23 10:51 PM  Result Value Ref Range   Creatinine, Urine 89 mg/dL   Total Protein, Urine 9 mg/dL   Protein Creatinine Ratio 0.10 0.00 - 0.15 mg/mg[Cre]  CBC     Status: Abnormal   Collection Time: 02/16/23 11:10 PM  Result Value Ref Range   WBC 14.7 (H) 4.0 - 10.5 K/uL   RBC 4.10 3.87 - 5.11 MIL/uL   Hemoglobin 11.7 (L) 12.0 - 15.0 g/dL   HCT 16.1 (L) 09.6 - 04.5 %   MCV 85.1 80.0 - 100.0 fL   MCH 28.5 26.0 - 34.0 pg   MCHC 33.5 30.0 - 36.0 g/dL   RDW 40.9 81.1 - 91.4 %   Platelets 248 150 - 400 K/uL   nRBC 0.0 0.0 - 0.2 %  Comprehensive metabolic panel     Status: Abnormal   Collection Time: 02/16/23 11:10 PM  Result Value Ref Range   Sodium 134 (L) 135 - 145 mmol/L   Potassium 3.6 3.5 - 5.1 mmol/L   Chloride 104 98 - 111 mmol/L   CO2 20 (L) 22 - 32 mmol/L   Glucose, Bld 89 70 - 99 mg/dL   BUN 6 6 - 20 mg/dL   Creatinine,  Ser 0.51 0.44 - 1.00 mg/dL   Calcium 9.2 8.9 - 16.1 mg/dL   Total Protein 5.9 (L) 6.5 - 8.1 g/dL   Albumin 2.6 (L) 3.5 - 5.0 g/dL   AST 18 15 - 41 U/L   ALT 16 0 - 44 U/L   Alkaline Phosphatase 90 38 - 126 U/L   Total Bilirubin 0.4 0.3 - 1.2 mg/dL   GFR, Estimated >09 >60 mL/min   Anion gap 10 5 - 15    O/RH(D) POSITIVE/-- (12/14 1443)  Imaging:    MAU Course/MDM: I have reviewed the triage vital signs and the nursing notes.   Pertinent labs & imaging results that were available during my care of the patient were reviewed by me and considered in my medical decision making (see chart for details).      I have reviewed her medical records including past results, notes and treatments.   I have ordered labs and reviewed results. These are normal  NST reviewed, reactive Cervix unchanged after  one hour  Offered option of one more hour recheck, wants to go home for a while Treatments in MAU included Declines tocolysis.    Assessment: Single IUP at [redacted]w[redacted]d Preterm uterine contractions Chronic hypertension  Normal lab results Prior C/S, plans TOLAC, but considering C/S  Plan: Discharge home Preeclampsia precautions Labor precautions and fetal kick counts Follow up in Office for prenatal visits and recheck Encouraged to return if she develops worsening of symptoms, increase in pain, fever, or other concerning symptoms.    Pt stable at time of discharge.  Wynelle Bourgeois CNM, MSN Certified Nurse-[name] 02/16/2023 11:46 PM

## 2023-02-16 NOTE — MAU Note (Signed)
2351- This RN called Main lab for update on patient CBC and CMP; lab work was sent down at 2312 and still reads as  "collected". Lab tech told this nurse the CBC and CMP was in techs hand and she would receive it at this time.

## 2023-02-17 DIAGNOSIS — Z3A35 35 weeks gestation of pregnancy: Secondary | ICD-10-CM

## 2023-02-17 DIAGNOSIS — O10913 Unspecified pre-existing hypertension complicating pregnancy, third trimester: Secondary | ICD-10-CM

## 2023-02-17 DIAGNOSIS — O4703 False labor before 37 completed weeks of gestation, third trimester: Secondary | ICD-10-CM | POA: Diagnosis not present

## 2023-02-17 LAB — CBC
HCT: 34.9 % — ABNORMAL LOW (ref 36.0–46.0)
Hemoglobin: 11.7 g/dL — ABNORMAL LOW (ref 12.0–15.0)
MCH: 28.5 pg (ref 26.0–34.0)
MCHC: 33.5 g/dL (ref 30.0–36.0)
MCV: 85.1 fL (ref 80.0–100.0)
Platelets: 248 10*3/uL (ref 150–400)
RBC: 4.1 MIL/uL (ref 3.87–5.11)
RDW: 13.8 % (ref 11.5–15.5)
WBC: 14.7 10*3/uL — ABNORMAL HIGH (ref 4.0–10.5)
nRBC: 0 % (ref 0.0–0.2)

## 2023-02-17 LAB — COMPREHENSIVE METABOLIC PANEL
ALT: 16 U/L (ref 0–44)
AST: 18 U/L (ref 15–41)
Albumin: 2.6 g/dL — ABNORMAL LOW (ref 3.5–5.0)
Alkaline Phosphatase: 90 U/L (ref 38–126)
Anion gap: 10 (ref 5–15)
BUN: 6 mg/dL (ref 6–20)
CO2: 20 mmol/L — ABNORMAL LOW (ref 22–32)
Calcium: 9.2 mg/dL (ref 8.9–10.3)
Chloride: 104 mmol/L (ref 98–111)
Creatinine, Ser: 0.51 mg/dL (ref 0.44–1.00)
GFR, Estimated: >60 mL/min (ref >60)
Glucose, Bld: 89 mg/dL (ref 70–99)
Potassium: 3.6 mmol/L (ref 3.5–5.1)
Sodium: 134 mmol/L — ABNORMAL LOW (ref 135–145)
Total Bilirubin: 0.4 mg/dL (ref 0.3–1.2)
Total Protein: 5.9 g/dL — ABNORMAL LOW (ref 6.5–8.1)

## 2023-02-17 NOTE — MAU Provider Note (Incomplete)
Chief Complaint:  No chief complaint on file.   Event Date/Time   First Provider Initiated Contact with Patient 02/16/23 2336     HPI: Susan Krause is a 32 y.o. [id number] at 89w2dwho presents to maternity admissions reporting painful contractions and elevated blood pressure.  Has had Betamethasone for preterm labor in May  Is taking Labetalol 100mg  once a day for migraines. Was prescribed Procardia but is not taking it.  . She reports good fetal movement, denies LOF, vaginal bleeding, vaginal itching/burning, urinary symptoms, h/a, dizziness, n/v, diarrhea, constipation or fever/chills.  She denies headache, visual changes or RUQ abdominal pain.  Abdominal Pain The current episode started today. The problem occurs intermittently. The pain is moderate. The quality of the pain is cramping. The abdominal pain does not radiate. Associated symptoms include nausea. Pertinent negatives include no constipation, diarrhea, dysuria, myalgias or vomiting. Nothing aggravates the pain. The pain is relieved by Nothing. She has tried nothing for the symptoms.   RN Note: Marland KitchenMarland KitchenZyauna Krause is a 32 y.o. at [redacted]w[redacted]d here in MAU reporting: contractions for the past two hours, has not timed them because she thought they were braxton hicks but have gotten more intense. +FM. Denies vaginal bleeding or leaking of fluid.  blood pressure at home 156/103; 160/106 Denies h/a visual changes or epigastric pain.  Reports induction on 02/28/2023 for BP, takes aspirin and labetalol. Last dose was last night Pain score: 7/10  Past Medical History: Past Medical History:  Diagnosis Date  . ADHD (attention deficit hyperactivity disorder)    No meds  . Cervical dysplasia   . Chlamydia   . Head injury with loss of consciousness (HCC) 03/2020  . Hypertension   . Hypoglycemia   . Migraine    Propanolol 10 mg  . OCD (obsessive compulsive disorder)   . Ovarian cyst   . PTSD (post-traumatic stress disorder)    no meds  . PVC (premature  ventricular contraction)   . PVC's (premature ventricular contractions) 2018   has palpations when she has PV's.  . Sinus tachycardia   . UTI (urinary tract infection)   . Vaginal Pap smear, abnormal     Past obstetric history: OB History  Gravida Para Term Preterm AB Living  6 1 1   4 1   SAB IAB Ectopic Multiple Live Births  2   2 0 1    # Outcome Date GA Lbr Len/2nd Weight Sex Delivery Anes PTL Lv  6 Current           5 SAB 12/2021          4 Ectopic 06/2021          3 SAB 05/2021          2 Ectopic 12/20/20 [redacted]w[redacted]d         1 Term 07/18/18 [redacted]w[redacted]d / 04:17 3715 g M CS-Vac EPI  LIV    Obstetric Comments  C/s for fail to descent, pushed for 3 hrs    Past Surgical History: Past Surgical History:  Procedure Laterality Date  . CESAREAN SECTION N/A 07/18/2018   Procedure: CESAREAN SECTION;  Surgeon: Essie Hart, MD;  Location: [facility] BIRTHING SUITES;  Service: Obstetrics;  Laterality: N/A;  . DIAGNOSTIC LAPAROSCOPY WITH REMOVAL OF ECTOPIC PREGNANCY Left 12/20/2020   Procedure: LAPAROSCOPIC LEFT SALPINGECTOMY WITH REMOVAL OF ECTOPIC PREGNANCY;  Surgeon: [name], MD;  Location: [facility] OR;  Service: Gynecology;  Laterality: Left;  . DILATION AND CURETTAGE OF UTERUS N/A 07/01/2021   Procedure: DILATATION  AND CURETTAGE WITH PATHOLOGY;  Surgeon: Milas Hock, MD;  Location: [facility] OR;  Service: Gynecology;  Laterality: N/A;  . SALPINGECTOMY Left 12/2020  . WISDOM TOOTH EXTRACTION      Family History: Family History  Problem Relation Age of Onset  . Diabetes Mother   . Cancer Mother        stage 2 breast  . ADD / ADHD Mother   . Anxiety disorder Mother   . Depression Mother   . Hyperlipidemia Mother   . Obesity Mother   . Depression Father   . ADD / ADHD Father   . Anxiety disorder Sister   . Asthma Sister   . Depression Sister   . ADD / ADHD Sister   . Intellectual disability Brother   . Learning disabilities Brother   . Anxiety disorder Maternal Aunt   . Diabetes Maternal  Aunt   . Alcohol abuse Maternal Uncle   . Hyperlipidemia Maternal Uncle   . Anxiety disorder Paternal Uncle   . Diabetes Paternal Uncle   . Anxiety disorder Maternal Grandmother   . Diabetes Maternal Grandmother   . Hyperlipidemia Maternal Grandmother   . Varicose Veins Maternal Grandmother   . Cancer Maternal Grandfather   . Hyperlipidemia Maternal Grandfather   . Obesity Maternal Grandfather   . Vision loss Maternal Grandfather   . Cancer Paternal Grandmother     Social History: Social History   Tobacco Use  . Smoking status: Never  . Smokeless tobacco: Never  Vaping Use  . Vaping Use: Never used  Substance Use Topics  . Alcohol use: No  . Drug use: No    Allergies:  Allergies  Allergen Reactions  . Banana Swelling  . Latex Swelling  . Peanut-Containing Drug Products Anaphylaxis  . Hpv 4-Valent Vaccine Recombinant Vaccine Other (See Comments)  . Hpv Bival (Type 16,18) Recomb Vaccine  [Human Papillomavirus (16,18) Recomb Vac] Hives  . Iodinated Contrast Media Diarrhea, Palpitations and Other (See Comments)  . Peanut Oil Swelling    Tongue swells/itching  . Pumpkin Flavor Swelling  . Milk (Cow) Other (See Comments)  . Lactose Intolerance (Gi) Other (See Comments)    Gi upset    Meds:  Medications Prior to Admission  Medication Sig Dispense Refill Last Dose  . aspirin EC 81 MG tablet Take 1 tablet (81 mg total) by mouth daily. Swallow whole. 30 tablet 12 02/15/2023  . labetalol (NORMODYNE) 100 MG tablet Take 100 mg by mouth daily.   02/15/2023  . Prenatal Multivit-Min-Fe-FA (PRENATAL 1 + IRON PO)    02/15/2023  . acetaminophen (TYLENOL) 500 MG tablet Take 1,000 mg by mouth every 6 (six) hours as needed.     . cetirizine (ZYRTEC) 10 MG chewable tablet Chew 10 mg by mouth daily.     . cyclobenzaprine (FLEXERIL) 10 MG tablet Take 1 tablet (10 mg total) by mouth 3 (three) times daily as needed for muscle spasms. (Patient not taking: Reported on 02/10/2023) 30 tablet 2   .  famotidine (PEPCID) 20 MG tablet Take 20 mg by mouth 2 (two) times daily.     Marland Kitchen NIFEdipine (PROCARDIA XL) 30 MG 24 hr tablet Take 1 tablet (30 mg total) by mouth daily. (Patient not taking: Reported on 02/10/2023) 30 tablet 1     I have reviewed patient's Past Medical Hx, Surgical Hx, Family Hx, Social Hx, medications and allergies.   ROS:  Review of Systems  Gastrointestinal:  Positive for abdominal pain and nausea. Negative for constipation, diarrhea  and vomiting.  Genitourinary:  Negative for dysuria.  Musculoskeletal:  Negative for myalgias.   Other systems negative  Physical Exam  Patient Vitals for the past 24 hrs:  BP Temp Temp src Pulse Resp SpO2 Height Weight  02/16/23 2330 (!) 132/93 -- -- (!) 118 -- 99 % -- --  02/16/23 2325 -- -- -- -- -- 100 % -- --  02/16/23 2320 -- -- -- -- -- 100 % -- --  02/16/23 2315 (!) 142/100 -- -- (!) 119 -- 100 % -- --  02/16/23 2310 -- -- -- -- -- 99 % -- --  02/16/23 2305 -- -- -- -- -- 98 % -- --  02/16/23 2300 137/76 -- -- (!) 118 -- -- -- --  02/16/23 2259 -- -- -- -- -- 99 % -- --  02/16/23 2255 -- -- -- -- -- 99 % -- --  02/16/23 2250 -- -- -- -- -- 98 % -- --  02/16/23 2245 (!) 143/93 -- -- (!) 119 -- -- -- --  02/16/23 2231 (!) 157/98 98 F (36.7 C) Oral (!) 122 20 98 % 5\' 2"  (1.575 m) 105.5 kg   Constitutional: Well-developed, well-nourished female in no acute distress.  Cardiovascular: normal rate and rhythm Respiratory: normal effort, clear to auscultation bilaterally GI: Abd soft, non-tender, gravid appropriate for gestational age.   No rebound or guarding. MS: Extremities nontender, no edema, normal ROM Neurologic: Alert and oriented x 4.  GU: Neg CVAT.  PELVIC EXAM: Cervix pink, visually closed, without lesion, scant white creamy discharge, vaginal walls and external genitalia normal Bimanual exam: Cervix firm, posterior, neg CMT, uterus nontender, Fundal Height consistent with dates, adnexa without tenderness, enlargement,  or mass  Dilation: 1 Effacement (%): Thick Station: -3 Exam by:: Artelia Laroche CNM  FHT:  Baseline *** , moderate variability, accelerations present, no decelerations Contractions: q *** mins Irregular  Rare   Labs: Results for orders placed or performed during the hospital encounter of 02/16/23 (from the past 24 hour(s))  Protein / creatinine ratio, urine     Status: None   Collection Time: 02/16/23 10:51 PM  Result Value Ref Range   Creatinine, Urine 89 mg/dL   Total Protein, Urine 9 mg/dL   Protein Creatinine Ratio 0.10 0.00 - 0.15 mg/mg[Cre]   O/RH(D) POSITIVE/-- (12/14 1443)  Imaging:  [place] MFM OB FOLLOW UP  Result Date: 01/26/2023 ----------------------------------------------------------------------  OBSTETRICS REPORT                       (Signed Final 01/26/2023 09:56 am) ---------------------------------------------------------------------- Patient Info  ID #:       [id number]                          D.O.B.:  [birth year] (31 yrs)  Name:       Joice Lofts Bartolini                       Visit Date: 01/26/2023 07:17 am ---------------------------------------------------------------------- Performed By  Attending:        Braxton Feathers DO       Ref. Address:     [address]  [place], [place]  Performed By:     Anabel Halon          [facility]  Referred By:      Everardo All ---------------------------------------------------------------------- Orders  #  Description                           Code        Ordered By  1  [place] MFM OB FOLLOW UP                   16109.60    Lin Landsman  2  [place] MFM FETAL BPP WO NON               76819.01    [facility]     STRESS ----------------------------------------------------------------------  #  Order #                     Accession #                Episode #  1  [id number]                   [id number]                 [id number]  2  [id number]                   [id number]                 [id number] ---------------------------------------------------------------------- Indications  Hypertension - Chronic/Pre-existing            O10.019  Obesity complicating pregnancy, second         O99.212  trimester (39)  Medical complication of pregnancy (PVCs)       O26.90  [redacted] weeks gestation of pregnancy                Z3A.32  History of cesarean delivery, currently        O34.219  pregnant  Poor  obstetric history: Previous               O09.299  preeclampsia / eclampsia/gestational HTN ---------------------------------------------------------------------- Fetal Evaluation  Num Of Fetuses:         1  Fetal Heart Rate(bpm):  142  Cardiac Activity:       Observed  Presentation:           Cephalic  Placenta:               Anterior  P. Cord Insertion:      Previously visualized  Amniotic Fluid  AFI FV:      Within normal limits  AFI Sum(cm)     %Tile       Largest Pocket(cm)  15.49           55          4.76  RUQ(cm)       RLQ(cm)       LUQ(cm)        LLQ(cm)  4.28          3.43          4.76           3.02 ---------------------------------------------------------------------- Biophysical Evaluation  Amniotic F.V:   Pocket => 2 cm             F. Tone:        Observed  F. Movement:    Observed                   Score:          8/8  F. Breathing:   Observed ---------------------------------------------------------------------- Biometry  BPD:      83.2  mm     G. Age:  33w 3d         70  %    CI:        76.31   %    70 - 86                                                          FL/HC:      19.7   %    19.9 - 21.5  HC:      301.8  mm     G. Age:  33w 4d         36  %    HC/AC:      0.98        0.96 - 1.11  AC:      307.6  mm     G. Age:   34w 5d         95  %    FL/BPD:     71.5   %    71 - 87  FL:       59.5  mm     G. Age:  31w 0d          7  %    FL/AC:      19.3   %    20 - 24  Est. FW:    2197  gm    4 lb 13 oz      68  % ---------------------------------------------------------------------- OB History  Maternal Racial/Ethnic Group:   White  Gravidity:    6  Term:   1         SAB:   4  Living:       1 ---------------------------------------------------------------------- Gestational Age  LMP:           32w 2d        Date:  06/14/22                  EDD:   03/21/23  U/S Today:     33w 1d                                        EDD:   03/15/23  Best:          32w 4d     Det. By:  U/S C R L  (08/23/22)    EDD:   03/19/23 ---------------------------------------------------------------------- Anatomy  Cranium:               Appears normal         LVOT:                   Previously seen  Cavum:                 Previously seen        Aortic Arch:            Not well visualized  Ventricles:            Previously seen        Ductal Arch:            Not well visualized  Choroid Plexus:        Previously seen        Diaphragm:              Appears normal  Cerebellum:            Previously seen        Stomach:                Appears normal, left                                                                        sided  Posterior Fossa:       Previously seen        Abdomen:                Previously seen  Nuchal Fold:           Previously seen        Abdominal Wall:         Previously seen  Face:                  Orbits previously      Cord Vessels:           Previously seen                         seen, profile NWV  Lips:                  Previously seen  Kidneys:                Appear normal  Palate:                Not well visualized    Bladder:                Appears normal  Thoracic:              Previously seen        Spine:                  Limited views prev                                                                        appear normal   Heart:                 Previously seen        Upper Extremities:      Previously seen  RVOT:                  Previously seen        Lower Extremities:      Previously seen  Other:  Female gender previously seen. Hands, feet and Rt heel previously          visualized. Lenses previously visualized. Technically difficult due to          maternal habitus and fetal position. ---------------------------------------------------------------------- Cervix Uterus Adnexa  Cervix  Not visualized (advanced GA >24wks)  Uterus  No abnormality visualized.  Right Ovary  Not visualized.  Left Ovary  Within normal limits.  Cul De Sac  No free fluid seen.  Adnexa  No abnormality visualized ---------------------------------------------------------------------- Comments  The patient is here for a follow-up BPP and growth ultrasound  at 32w 4d for [facility]. EDD: 03/19/2023 dated by U/S C R L  (08/23/22). She has no concerns today. BP is normal.  Sonographic findings  Single intrauterine pregnancy.  Fetal cardiac activity: Observed.  Presentation: Cephalic.  Interval fetal anatomy appears normal.  Fetal biometry shows the estimated fetal weight at the 68  percentile.  Amniotic fluid volume: Within normal limits. AFI: 15.49 cm.  MVP: 4.76 cm.  Placenta: Anterior.  BPP: 8/8.  Recommendations  1. BBPs weekly until delivery  2. Growth ultrasounds every 4 weeks until delivery  3. Delivery around 37-[redacted] weeks gestation pending BP control ----------------------------------------------------------------------                  Braxton Feathers, DO Electronically Signed Final Report   01/26/2023 09:56 am ----------------------------------------------------------------------  [place] MFM FETAL BPP WO NON STRESS  Result Date: 01/26/2023 ----------------------------------------------------------------------  OBSTETRICS REPORT                       (Signed Final 01/26/2023 09:56 am) ---------------------------------------------------------------------- Patient  Info  ID #:       [id number]                          D.O.B.:  06/26/1991 (31 yrs)  Name:       Susan Krause  Visit Date: 01/26/2023 07:17 am ---------------------------------------------------------------------- Performed By  Attending:        Braxton Feathers DO       Ref. Address:     [address], [place]  Performed By:     Anabel Halon          [facility]  Referred By:      Everardo All ---------------------------------------------------------------------- Orders  #  Description                           Code        Ordered By  1  [place] MFM OB FOLLOW UP                   16109.60    Lin Landsman  2  [place] MFM FETAL BPP WO NON               76819.01    [facility]     STRESS ----------------------------------------------------------------------  #  Order #                     Accession #                Episode #  1  [id number]                   [id number]                 [id number]  2  [id number]                   [id number]  [id number] ---------------------------------------------------------------------- Indications  Hypertension - Chronic/Pre-existing            O10.019  Obesity complicating pregnancy, second         O99.212  trimester (39)  Medical complication of pregnancy (PVCs)       O26.90  [redacted] weeks gestation of pregnancy                Z3A.32  History of cesarean delivery, currently        O34.219  pregnant  Poor obstetric history: Previous               O09.299  preeclampsia / eclampsia/gestational HTN ---------------------------------------------------------------------- Fetal Evaluation  Num Of Fetuses:          1  Fetal Heart Rate(bpm):  142  Cardiac Activity:       Observed  Presentation:           Cephalic  Placenta:               Anterior  P. Cord Insertion:      Previously visualized  Amniotic Fluid  AFI FV:      Within normal limits  AFI Sum(cm)     %Tile       Largest Pocket(cm)  15.49           55          4.76  RUQ(cm)       RLQ(cm)       LUQ(cm)        LLQ(cm)  4.28          3.43          4.76           3.02 ---------------------------------------------------------------------- Biophysical Evaluation  Amniotic F.V:   Pocket => 2 cm             F. Tone:        Observed  F. Movement:    Observed                   Score:          8/8  F. Breathing:   Observed ---------------------------------------------------------------------- Biometry  BPD:      83.2  mm     G. Age:  33w 3d         70  %    CI:        76.31   %    70 - 86                                                          FL/HC:      19.7   %    19.9 - 21.5  HC:      301.8  mm     G. Age:  33w 4d         36  %    HC/AC:      0.98        0.96 - 1.11  AC:      307.6  mm     G. Age:  34w 5d         95  %    FL/BPD:     71.5   %    71 - 87  FL:  59.5  mm     G. Age:  31w 0d          7  %    FL/AC:      19.3   %    20 - 24  Est. FW:    2197  gm    4 lb 13 oz      68  % ---------------------------------------------------------------------- OB History  Maternal Racial/Ethnic Group:   White  Gravidity:    6         Term:   1         SAB:   4  Living:       1 ---------------------------------------------------------------------- Gestational Age  LMP:           32w 2d        Date:  06/14/22                  EDD:   03/21/23  U/S Today:     33w 1d                                        EDD:   03/15/23  Best:          32w 4d     Det. By:  U/S C R L  (08/23/22)    EDD:   03/19/23 ---------------------------------------------------------------------- Anatomy  Cranium:               Appears normal         LVOT:                   Previously seen  Cavum:                  Previously seen        Aortic Arch:            Not well visualized  Ventricles:            Previously seen        Ductal Arch:            Not well visualized  Choroid Plexus:        Previously seen        Diaphragm:              Appears normal  Cerebellum:            Previously seen        Stomach:                Appears normal, left                                                                        sided  Posterior Fossa:       Previously seen        Abdomen:                Previously seen  Nuchal Fold:           Previously seen        Abdominal Wall:         Previously seen  Face:  Orbits previously      Cord Vessels:           Previously seen                         seen, profile NWV  Lips:                  Previously seen        Kidneys:                Appear normal  Palate:                Not well visualized    Bladder:                Appears normal  Thoracic:              Previously seen        Spine:                  Limited views prev                                                                        appear normal  Heart:                 Previously seen        Upper Extremities:      Previously seen  RVOT:                  Previously seen        Lower Extremities:      Previously seen  Other:  Female gender previously seen. Hands, feet and Rt heel previously          visualized. Lenses previously visualized. Technically difficult due to          maternal habitus and fetal position. ---------------------------------------------------------------------- Cervix Uterus Adnexa  Cervix  Not visualized (advanced GA >24wks)  Uterus  No abnormality visualized.  Right Ovary  Not visualized.  Left Ovary  Within normal limits.  Cul De Sac  No free fluid seen.  Adnexa  No abnormality visualized ---------------------------------------------------------------------- Comments  The patient is here for a follow-up BPP and growth ultrasound  at 32w 4d for [facility] EDD: 03/19/2023 dated by U/S C R L   (08/23/22). She has no concerns today. BP is normal.  Sonographic findings  Single intrauterine pregnancy.  Fetal cardiac activity: Observed.  Presentation: Cephalic.  Interval fetal anatomy appears normal.  Fetal biometry shows the estimated fetal weight at the 68  percentile.  Amniotic fluid volume: Within normal limits. AFI: 15.49 cm.  MVP: 4.76 cm.  Placenta: Anterior.  BPP: 8/8.  Recommendations  1. BBPs weekly until delivery  2. Growth ultrasounds every 4 weeks until delivery  3. Delivery around 37-[redacted] weeks gestation pending BP control ----------------------------------------------------------------------                  Braxton Feathers, DO Electronically Signed Final Report   01/26/2023 09:56 am ----------------------------------------------------------------------   MAU Course/MDM: I have reviewed the triage vital signs and the nursing notes.   Pertinent labs & imaging results that were available during my care of the patient were reviewed  by me and considered in my medical decision making (see chart for details).      I have reviewed her medical records including past results, notes and treatments.   I have ordered labs and reviewed results.  NST reviewed Consult *** with presentation, exam findings and test results.  Treatments in MAU included ***.    Assessment: No diagnosis found.  Plan: Discharge home Labor precautions and fetal kick counts Follow up in Office for prenatal visits and recheck   Pt stable at time of discharge.  Wynelle Bourgeois CNM, MSN Certified Nurse-[name] 02/16/2023 11:46 PM

## 2023-02-18 ENCOUNTER — Other Ambulatory Visit (HOSPITAL_COMMUNITY)
Admission: RE | Admit: 2023-02-18 | Discharge: 2023-02-18 | Disposition: A | Discharge status: Home / Self Care | Payer: BC Managed Care – PPO | Source: Ambulatory Visit | Attending: Certified Nurse Midwife | Admitting: Certified Nurse Midwife

## 2023-02-18 ENCOUNTER — Encounter: Payer: Self-pay | Admitting: *Deleted

## 2023-02-18 ENCOUNTER — Ambulatory Visit (INDEPENDENT_AMBULATORY_CARE_PROVIDER_SITE_OTHER): Payer: BC Managed Care – PPO | Admitting: *Deleted

## 2023-02-18 VITALS — BP 133/75 | HR 105

## 2023-02-18 DIAGNOSIS — O219 Vomiting of pregnancy, unspecified: Secondary | ICD-10-CM | POA: Diagnosis present

## 2023-02-18 DIAGNOSIS — N898 Other specified noninflammatory disorders of vagina: Secondary | ICD-10-CM

## 2023-02-18 DIAGNOSIS — Z3A35 35 weeks gestation of pregnancy: Secondary | ICD-10-CM

## 2023-02-18 DIAGNOSIS — O10013 Pre-existing essential hypertension complicating pregnancy, third trimester: Secondary | ICD-10-CM | POA: Diagnosis not present

## 2023-02-18 DIAGNOSIS — B3731 Acute candidiasis of vulva and vagina: Secondary | ICD-10-CM

## 2023-02-18 MED ORDER — ONDANSETRON HCL 4 MG PO TABS: 4.0000 mg | ORAL_TABLET | Freq: Three times a day (TID) | ORAL | 0 refills | Status: DC | PRN

## 2023-02-18 NOTE — Progress Notes (Signed)
Pt here for NST only-Reactive She is requesting a self swab for yeast and BV as she said she has a latex allergy and thinks the hospital used Latex gloves when she was checked earlier this week.  Pt has is requesting a RX for Zofran as she states that she has waves of nausea when she is having contractions(Braxton Willa Rough).

## 2023-02-21 LAB — CERVICOVAGINAL ANCILLARY ONLY
Bacterial Vaginitis (gardnerella): NEGATIVE
Candida Glabrata: NEGATIVE
Candida Vaginitis: POSITIVE — AB
Comment: NEGATIVE
Comment: NEGATIVE
Comment: NEGATIVE

## 2023-02-22 MED ORDER — TERCONAZOLE 0.4 % VA CREA
1.0000 | TOPICAL_CREAM | Freq: Every day | VAGINAL | 0 refills | Status: DC
Start: 2023-02-22 — End: 2023-03-02

## 2023-02-22 NOTE — Addendum Note (Signed)
Addended by: Donette Larry E on: 02/22/2023 10:46 AM   Modules accepted: Orders

## 2023-02-23 ENCOUNTER — Ambulatory Visit: Payer: BC Managed Care – PPO | Attending: Obstetrics and Gynecology

## 2023-02-23 ENCOUNTER — Ambulatory Visit: Payer: BC Managed Care – PPO | Admitting: *Deleted

## 2023-02-23 VITALS — BP 134/75 | HR 93

## 2023-02-23 DIAGNOSIS — Z3A36 36 weeks gestation of pregnancy: Secondary | ICD-10-CM

## 2023-02-23 DIAGNOSIS — O99213 Obesity complicating pregnancy, third trimester: Secondary | ICD-10-CM

## 2023-02-23 DIAGNOSIS — O10013 Pre-existing essential hypertension complicating pregnancy, third trimester: Secondary | ICD-10-CM | POA: Diagnosis not present

## 2023-02-23 DIAGNOSIS — O10913 Unspecified pre-existing hypertension complicating pregnancy, third trimester: Secondary | ICD-10-CM | POA: Diagnosis not present

## 2023-02-23 DIAGNOSIS — I499 Cardiac arrhythmia, unspecified: Secondary | ICD-10-CM

## 2023-02-23 DIAGNOSIS — O99413 Diseases of the circulatory system complicating pregnancy, third trimester: Secondary | ICD-10-CM

## 2023-02-23 DIAGNOSIS — O0993 Supervision of high risk pregnancy, unspecified, third trimester: Secondary | ICD-10-CM

## 2023-02-23 DIAGNOSIS — O34219 Maternal care for unspecified type scar from previous cesarean delivery: Secondary | ICD-10-CM

## 2023-02-23 DIAGNOSIS — E669 Obesity, unspecified: Secondary | ICD-10-CM

## 2023-02-23 DIAGNOSIS — O09293 Supervision of pregnancy with other poor reproductive or obstetric history, third trimester: Secondary | ICD-10-CM

## 2023-02-24 ENCOUNTER — Other Ambulatory Visit: Payer: Self-pay | Admitting: Family Medicine

## 2023-02-24 ENCOUNTER — Ambulatory Visit (INDEPENDENT_AMBULATORY_CARE_PROVIDER_SITE_OTHER): Payer: BC Managed Care – PPO | Admitting: Obstetrics and Gynecology

## 2023-02-24 ENCOUNTER — Encounter: Payer: Self-pay | Admitting: Obstetrics and Gynecology

## 2023-02-24 ENCOUNTER — Other Ambulatory Visit (HOSPITAL_COMMUNITY)
Admission: RE | Admit: 2023-02-24 | Discharge: 2023-02-24 | Disposition: A | Discharge status: Home / Self Care | Payer: BC Managed Care – PPO | Source: Ambulatory Visit | Attending: Obstetrics and Gynecology | Admitting: Obstetrics and Gynecology

## 2023-02-24 VITALS — BP 132/80 | HR 99 | Wt 233.0 lb

## 2023-02-24 DIAGNOSIS — O10919 Unspecified pre-existing hypertension complicating pregnancy, unspecified trimester: Secondary | ICD-10-CM

## 2023-02-24 DIAGNOSIS — O10913 Unspecified pre-existing hypertension complicating pregnancy, third trimester: Secondary | ICD-10-CM

## 2023-02-24 DIAGNOSIS — Z3A36 36 weeks gestation of pregnancy: Secondary | ICD-10-CM

## 2023-02-24 DIAGNOSIS — Z98891 History of uterine scar from previous surgery: Secondary | ICD-10-CM

## 2023-02-24 DIAGNOSIS — O0993 Supervision of high risk pregnancy, unspecified, third trimester: Secondary | ICD-10-CM | POA: Diagnosis not present
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
## 2023-02-25 LAB — CERVICOVAGINAL ANCILLARY ONLY
Chlamydia: NEGATIVE
Comment: NEGATIVE
Comment: NORMAL
Neisseria Gonorrhea: NEGATIVE

## 2023-02-28 ENCOUNTER — Telehealth (HOSPITAL_COMMUNITY): Payer: Self-pay | Admitting: *Deleted

## 2023-02-28 ENCOUNTER — Encounter (HOSPITAL_COMMUNITY): Payer: Self-pay

## 2023-02-28 LAB — CULTURE, BETA STREP (GROUP B ONLY): Strep Gp B Culture: NEGATIVE

## 2023-02-28 NOTE — Telephone Encounter (Signed)
Preadmission screen  

## 2023-03-01 ENCOUNTER — Encounter (HOSPITAL_COMMUNITY): Payer: Self-pay

## 2023-03-01 NOTE — Patient Instructions (Signed)
Susan Krause  03/01/2023   Your procedure is scheduled on:  03/10/2023  Arrive at 0730 at Entrance C on [address] at [facility]  and [address]. You are invited to use the FREE valet parking or use the Visitor's parking deck.  Pick up the phone at the desk and dial [phone].  Call this number if you have problems the morning of surgery: [phone]  Remember:   Do not eat food:(After Midnight) Desps de medianoche.  Do not drink clear liquids: (After Midnight) Desps de medianoche.  Take these medicines the morning of surgery with A SIP OF WATER:  Labetalol    Do not wear jewelry, make-up or nail polish.  Do not wear lotions, powders, or perfumes. Do not wear deodorant.  Do not shave 48 hours prior to surgery.  Do not bring valuables to the hospital.  [facility] is not   responsible for any belongings or valuables brought to the hospital.  Contacts, dentures or bridgework may not be worn into surgery.  Leave suitcase in the car. After surgery it may be brought to your room.  For patients admitted to the hospital, checkout time is 11:00 AM the day of              discharge.      Please read over the following fact sheets that you were given:     Preparing for Surgery

## 2023-03-02 ENCOUNTER — Ambulatory Visit: Payer: BC Managed Care – PPO | Admitting: *Deleted

## 2023-03-02 ENCOUNTER — Ambulatory Visit (INDEPENDENT_AMBULATORY_CARE_PROVIDER_SITE_OTHER): Payer: BC Managed Care – PPO | Admitting: Obstetrics and Gynecology

## 2023-03-02 ENCOUNTER — Encounter: Payer: Self-pay | Admitting: Obstetrics and Gynecology

## 2023-03-02 ENCOUNTER — Ambulatory Visit: Payer: BC Managed Care – PPO | Attending: Obstetrics and Gynecology

## 2023-03-02 VITALS — BP 135/85 | HR 114 | Wt 234.0 lb

## 2023-03-02 VITALS — BP 139/83 | HR 88

## 2023-03-02 DIAGNOSIS — E669 Obesity, unspecified: Secondary | ICD-10-CM

## 2023-03-02 DIAGNOSIS — O0993 Supervision of high risk pregnancy, unspecified, third trimester: Secondary | ICD-10-CM | POA: Diagnosis present

## 2023-03-02 DIAGNOSIS — B009 Herpesviral infection, unspecified: Secondary | ICD-10-CM

## 2023-03-02 DIAGNOSIS — O09293 Supervision of pregnancy with other poor reproductive or obstetric history, third trimester: Secondary | ICD-10-CM

## 2023-03-02 DIAGNOSIS — Z3A37 37 weeks gestation of pregnancy: Secondary | ICD-10-CM

## 2023-03-02 DIAGNOSIS — O99413 Diseases of the circulatory system complicating pregnancy, third trimester: Secondary | ICD-10-CM | POA: Diagnosis not present

## 2023-03-02 DIAGNOSIS — O10919 Unspecified pre-existing hypertension complicating pregnancy, unspecified trimester: Secondary | ICD-10-CM

## 2023-03-02 DIAGNOSIS — O34219 Maternal care for unspecified type scar from previous cesarean delivery: Secondary | ICD-10-CM

## 2023-03-02 DIAGNOSIS — O99213 Obesity complicating pregnancy, third trimester: Secondary | ICD-10-CM

## 2023-03-02 DIAGNOSIS — O10013 Pre-existing essential hypertension complicating pregnancy, third trimester: Secondary | ICD-10-CM

## 2023-03-02 DIAGNOSIS — O10913 Unspecified pre-existing hypertension complicating pregnancy, third trimester: Secondary | ICD-10-CM | POA: Diagnosis present

## 2023-03-02 DIAGNOSIS — I499 Cardiac arrhythmia, unspecified: Secondary | ICD-10-CM

## 2023-03-02 DIAGNOSIS — Z98891 History of uterine scar from previous surgery: Secondary | ICD-10-CM

## 2023-03-02 DIAGNOSIS — G43109 Migraine with aura, not intractable, without status migrainosus: Secondary | ICD-10-CM
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
## 2023-03-03 ENCOUNTER — Encounter: Payer: BC Managed Care – PPO | Admitting: Obstetrics and Gynecology

## 2023-03-07 ENCOUNTER — Encounter (HOSPITAL_COMMUNITY): Payer: Self-pay | Admitting: Family Medicine

## 2023-03-07 ENCOUNTER — Inpatient Hospital Stay (HOSPITAL_COMMUNITY)
Admission: AD | Admit: 2023-03-07 | Discharge: 2023-03-10 | DRG: 788 | Disposition: A | Discharge status: Home / Self Care | Payer: BC Managed Care – PPO | Attending: Family Medicine | Admitting: Family Medicine

## 2023-03-07 ENCOUNTER — Telehealth: Payer: Self-pay | Admitting: *Deleted

## 2023-03-07 ENCOUNTER — Other Ambulatory Visit: Payer: Self-pay

## 2023-03-07 ENCOUNTER — Encounter (HOSPITAL_COMMUNITY)
Admission: AD | Disposition: A | Discharge status: Home / Self Care | Payer: Self-pay | Source: Home / Self Care | Attending: Family Medicine

## 2023-03-07 ENCOUNTER — Inpatient Hospital Stay (HOSPITAL_COMMUNITY): Payer: BC Managed Care – PPO | Admitting: Anesthesiology

## 2023-03-07 DIAGNOSIS — O99344 Other mental disorders complicating childbirth: Secondary | ICD-10-CM | POA: Diagnosis present

## 2023-03-07 DIAGNOSIS — O34219 Maternal care for unspecified type scar from previous cesarean delivery: Secondary | ICD-10-CM

## 2023-03-07 DIAGNOSIS — F4312 Post-traumatic stress disorder, chronic: Secondary | ICD-10-CM | POA: Diagnosis present

## 2023-03-07 DIAGNOSIS — O0993 Supervision of high risk pregnancy, unspecified, third trimester: Principal | ICD-10-CM

## 2023-03-07 DIAGNOSIS — O1414 Severe pre-eclampsia complicating childbirth: Secondary | ICD-10-CM

## 2023-03-07 DIAGNOSIS — Z7982 Long term (current) use of aspirin: Secondary | ICD-10-CM | POA: Diagnosis not present

## 2023-03-07 DIAGNOSIS — O0990 Supervision of high risk pregnancy, unspecified, unspecified trimester: Secondary | ICD-10-CM

## 2023-03-07 DIAGNOSIS — O99214 Obesity complicating childbirth: Secondary | ICD-10-CM | POA: Diagnosis present

## 2023-03-07 DIAGNOSIS — O114 Pre-existing hypertension with pre-eclampsia, complicating childbirth: Secondary | ICD-10-CM | POA: Diagnosis not present

## 2023-03-07 DIAGNOSIS — Z3A38 38 weeks gestation of pregnancy: Secondary | ICD-10-CM | POA: Diagnosis not present

## 2023-03-07 DIAGNOSIS — O34211 Maternal care for low transverse scar from previous cesarean delivery: Secondary | ICD-10-CM

## 2023-03-07 DIAGNOSIS — O10919 Unspecified pre-existing hypertension complicating pregnancy, unspecified trimester: Secondary | ICD-10-CM | POA: Diagnosis present

## 2023-03-07 DIAGNOSIS — Z87410 Personal history of cervical dysplasia: Secondary | ICD-10-CM | POA: Diagnosis not present

## 2023-03-07 DIAGNOSIS — Z98891 History of uterine scar from previous surgery: Secondary | ICD-10-CM

## 2023-03-07 DIAGNOSIS — O09299 Supervision of pregnancy with other poor reproductive or obstetric history, unspecified trimester: Secondary | ICD-10-CM | POA: Diagnosis present

## 2023-03-07 DIAGNOSIS — O1490 Unspecified pre-eclampsia, unspecified trimester: Secondary | ICD-10-CM | POA: Diagnosis present

## 2023-03-07 DIAGNOSIS — O1092 Unspecified pre-existing hypertension complicating childbirth: Secondary | ICD-10-CM | POA: Diagnosis present

## 2023-03-07 LAB — PROTEIN / CREATININE RATIO, URINE
Creatinine, Urine: 87 mg/dL
Protein Creatinine Ratio: 0.13 mg/mg{Cre} (ref 0.00–0.15)
Total Protein, Urine: 11 mg/dL

## 2023-03-07 LAB — COMPREHENSIVE METABOLIC PANEL
ALT: 19 U/L (ref 0–44)
AST: 42 U/L — ABNORMAL HIGH (ref 15–41)
Albumin: 2.6 g/dL — ABNORMAL LOW (ref 3.5–5.0)
Alkaline Phosphatase: 104 U/L (ref 38–126)
Anion gap: 9 (ref 5–15)
BUN: 7 mg/dL (ref 6–20)
CO2: 20 mmol/L — ABNORMAL LOW (ref 22–32)
Calcium: 9 mg/dL (ref 8.9–10.3)
Chloride: 106 mmol/L (ref 98–111)
Creatinine, Ser: 0.5 mg/dL (ref 0.44–1.00)
GFR, Estimated: >60 mL/min (ref >60)
Glucose, Bld: 106 mg/dL — ABNORMAL HIGH (ref 70–99)
Potassium: 4.4 mmol/L (ref 3.5–5.1)
Sodium: 135 mmol/L (ref 135–145)
Total Bilirubin: 0.9 mg/dL (ref 0.3–1.2)
Total Protein: 6 g/dL — ABNORMAL LOW (ref 6.5–8.1)

## 2023-03-07 LAB — TYPE AND SCREEN
ABO/RH(D): O POS
Antibody Screen: NEGATIVE

## 2023-03-07 LAB — URINALYSIS, ROUTINE W REFLEX MICROSCOPIC
Bilirubin Urine: NEGATIVE
Glucose, UA: NEGATIVE mg/dL
Hgb urine dipstick: NEGATIVE
Ketones, ur: NEGATIVE mg/dL
Leukocytes,Ua: NEGATIVE
Nitrite: NEGATIVE
Protein, ur: NEGATIVE mg/dL
Specific Gravity, Urine: 1.013 (ref 1.005–1.030)
pH: 7 (ref 5.0–8.0)

## 2023-03-07 LAB — CBC
HCT: 35.9 % — ABNORMAL LOW (ref 36.0–46.0)
Hemoglobin: 11.9 g/dL — ABNORMAL LOW (ref 12.0–15.0)
MCH: 28.5 pg (ref 26.0–34.0)
MCHC: 33.1 g/dL (ref 30.0–36.0)
MCV: 86.1 fL (ref 80.0–100.0)
Platelets: 262 10*3/uL (ref 150–400)
RBC: 4.17 MIL/uL (ref 3.87–5.11)
RDW: 14.2 % (ref 11.5–15.5)
WBC: 11.6 10*3/uL — ABNORMAL HIGH (ref 4.0–10.5)
nRBC: 0 % (ref 0.0–0.2)

## 2023-03-07 LAB — GLUCOSE, CAPILLARY: Glucose-Capillary: 75 mg/dL (ref 70–99)

## 2023-03-07 LAB — RPR: RPR Ser Ql: NONREACTIVE

## 2023-03-07 SURGERY — Surgical Case
Anesthesia: Spinal

## 2023-03-07 MED ORDER — ACETAMINOPHEN 500 MG PO TABS
1000.0000 mg | ORAL_TABLET | Freq: Four times a day (QID) | ORAL | Status: DC
  Administered 2023-03-07 – 2023-03-10 (×10): 1000 mg via ORAL
  Filled 2023-03-07 (×12): qty 2

## 2023-03-07 MED ORDER — ENOXAPARIN SODIUM 60 MG/0.6ML IJ SOSY
50.0000 mg | PREFILLED_SYRINGE | INTRAMUSCULAR | Status: DC
  Administered 2023-03-08 – 2023-03-10 (×3): 50 mg via SUBCUTANEOUS
  Filled 2023-03-07 (×3): qty 0.6

## 2023-03-07 MED ORDER — BUPIVACAINE IN DEXTROSE 0.75-8.25 % IT SOLN
INTRATHECAL | Status: DC | PRN
  Administered 2023-03-07: 1.6 mL via INTRATHECAL

## 2023-03-07 MED ORDER — PHENYLEPHRINE HCL-NACL 20-0.9 MG/250ML-% IV SOLN
INTRAVENOUS | Status: AC
  Filled 2023-03-07: qty 250

## 2023-03-07 MED ORDER — SODIUM CHLORIDE 0.9% FLUSH: 3.0000 mL | INTRAVENOUS | Status: DC | PRN

## 2023-03-07 MED ORDER — GABAPENTIN 100 MG PO CAPS
200.0000 mg | ORAL_CAPSULE | Freq: Every day | ORAL | Status: DC
  Filled 2023-03-07 (×2): qty 2

## 2023-03-07 MED ORDER — FENTANYL CITRATE (PF) 100 MCG/2ML IJ SOLN
INTRAMUSCULAR | Status: AC
  Filled 2023-03-07: qty 2

## 2023-03-07 MED ORDER — SIMETHICONE 80 MG PO CHEW
80.0000 mg | CHEWABLE_TABLET | Freq: Three times a day (TID) | ORAL | Status: DC
  Administered 2023-03-08 – 2023-03-10 (×8): 80 mg via ORAL
  Filled 2023-03-07 (×8): qty 1

## 2023-03-07 MED ORDER — LACTATED RINGERS IV SOLN: INTRAVENOUS | Status: DC

## 2023-03-07 MED ORDER — KETOROLAC TROMETHAMINE 30 MG/ML IJ SOLN: 30.0000 mg | Freq: Four times a day (QID) | INTRAMUSCULAR | Status: DC | PRN

## 2023-03-07 MED ORDER — FENTANYL CITRATE (PF) 100 MCG/2ML IJ SOLN
INTRAMUSCULAR | Status: DC | PRN
  Administered 2023-03-07: 15 ug via INTRATHECAL

## 2023-03-07 MED ORDER — LABETALOL HCL 5 MG/ML IV SOLN: 40.0000 mg | INTRAVENOUS | Status: DC | PRN

## 2023-03-07 MED ORDER — OXYCODONE HCL 5 MG PO TABS: 5.0000 mg | ORAL_TABLET | ORAL | Status: DC | PRN

## 2023-03-07 MED ORDER — PHENYLEPHRINE HCL-NACL 20-0.9 MG/250ML-% IV SOLN
INTRAVENOUS | Status: DC | PRN
  Administered 2023-03-07: 60 ug/min via INTRAVENOUS

## 2023-03-07 MED ORDER — ACETAMINOPHEN 325 MG PO TABS
650.0000 mg | ORAL_TABLET | Freq: Once | ORAL | Status: AC
  Administered 2023-03-07: 650 mg via ORAL
  Filled 2023-03-07: qty 2

## 2023-03-07 MED ORDER — OXYTOCIN-SODIUM CHLORIDE 30-0.9 UT/500ML-% IV SOLN: 2.5000 [IU]/h | INTRAVENOUS | Status: AC

## 2023-03-07 MED ORDER — SCOPOLAMINE 1 MG/3DAYS TD PT72
MEDICATED_PATCH | TRANSDERMAL | Status: DC | PRN
  Administered 2023-03-07: 1 via TRANSDERMAL

## 2023-03-07 MED ORDER — ONDANSETRON HCL 4 MG/2ML IJ SOLN
INTRAMUSCULAR | Status: AC
  Filled 2023-03-07: qty 2

## 2023-03-07 MED ORDER — SODIUM CHLORIDE 0.9 % IV SOLN
25.0000 mg | Freq: Four times a day (QID) | INTRAVENOUS | Status: DC | PRN
  Administered 2023-03-07: 25 mg via INTRAVENOUS
  Filled 2023-03-07: qty 1

## 2023-03-07 MED ORDER — TRANEXAMIC ACID-NACL 1000-0.7 MG/100ML-% IV SOLN
1000.0000 mg | Freq: Once | INTRAVENOUS | Status: AC
  Administered 2023-03-07: 1000 mg via INTRAVENOUS

## 2023-03-07 MED ORDER — MORPHINE SULFATE (PF) 0.5 MG/ML IJ SOLN
INTRAMUSCULAR | Status: AC
  Filled 2023-03-07: qty 10

## 2023-03-07 MED ORDER — MAGNESIUM SULFATE BOLUS VIA INFUSION
4.0000 g | Freq: Once | INTRAVENOUS | Status: AC
  Administered 2023-03-07: 4 g via INTRAVENOUS
  Filled 2023-03-07: qty 1000

## 2023-03-07 MED ORDER — OXYTOCIN-SODIUM CHLORIDE 30-0.9 UT/500ML-% IV SOLN
INTRAVENOUS | Status: DC | PRN
  Administered 2023-03-07: 300 mL via INTRAVENOUS

## 2023-03-07 MED ORDER — PHENYLEPHRINE 80 MCG/ML (10ML) SYRINGE FOR IV PUSH (FOR BLOOD PRESSURE SUPPORT)
PREFILLED_SYRINGE | INTRAVENOUS | Status: AC
  Filled 2023-03-07: qty 10

## 2023-03-07 MED ORDER — DEXAMETHASONE SODIUM PHOSPHATE 10 MG/ML IJ SOLN
INTRAMUSCULAR | Status: DC | PRN
  Administered 2023-03-07: 10 mg via INTRAVENOUS

## 2023-03-07 MED ORDER — COCONUT OIL OIL
1.0000 | TOPICAL_OIL | Status: DC | PRN
  Administered 2023-03-07: 1 via TOPICAL

## 2023-03-07 MED ORDER — STERILE WATER FOR IRRIGATION IR SOLN
Status: DC | PRN
  Administered 2023-03-07: 1

## 2023-03-07 MED ORDER — SCOPOLAMINE 1 MG/3DAYS TD PT72
MEDICATED_PATCH | TRANSDERMAL | Status: AC
  Filled 2023-03-07: qty 1

## 2023-03-07 MED ORDER — TRANEXAMIC ACID-NACL 1000-0.7 MG/100ML-% IV SOLN
INTRAVENOUS | Status: AC
  Filled 2023-03-07: qty 100

## 2023-03-07 MED ORDER — KETOROLAC TROMETHAMINE 30 MG/ML IJ SOLN
INTRAMUSCULAR | Status: AC
  Filled 2023-03-07: qty 1

## 2023-03-07 MED ORDER — MENTHOL 3 MG MT LOZG: 1.0000 | LOZENGE | OROMUCOSAL | Status: DC | PRN

## 2023-03-07 MED ORDER — PRENATAL MULTIVITAMIN CH
1.0000 | ORAL_TABLET | Freq: Every day | ORAL | Status: DC
  Administered 2023-03-08 – 2023-03-10 (×3): 1 via ORAL
  Filled 2023-03-07 (×3): qty 1

## 2023-03-07 MED ORDER — DIPHENHYDRAMINE HCL 50 MG/ML IJ SOLN: 12.5000 mg | INTRAMUSCULAR | Status: DC | PRN

## 2023-03-07 MED ORDER — CALCIUM GLUCONATE-NACL 1-0.675 GM/50ML-% IV SOLN
INTRAVENOUS | Status: AC
  Filled 2023-03-07: qty 50

## 2023-03-07 MED ORDER — SENNOSIDES-DOCUSATE SODIUM 8.6-50 MG PO TABS
2.0000 | ORAL_TABLET | Freq: Every day | ORAL | Status: DC
  Administered 2023-03-08 – 2023-03-09 (×2): 2 via ORAL
  Filled 2023-03-07 (×2): qty 2

## 2023-03-07 MED ORDER — DIPHENHYDRAMINE HCL 25 MG PO CAPS: 25.0000 mg | ORAL_CAPSULE | Freq: Four times a day (QID) | ORAL | Status: DC | PRN

## 2023-03-07 MED ORDER — CEFAZOLIN SODIUM-DEXTROSE 2-4 GM/100ML-% IV SOLN
INTRAVENOUS | Status: AC
  Filled 2023-03-07: qty 100

## 2023-03-07 MED ORDER — DEXAMETHASONE SODIUM PHOSPHATE 4 MG/ML IJ SOLN
INTRAMUSCULAR | Status: AC
  Filled 2023-03-07: qty 2

## 2023-03-07 MED ORDER — LABETALOL HCL 5 MG/ML IV SOLN
20.0000 mg | INTRAVENOUS | Status: DC | PRN
  Administered 2023-03-07: 20 mg via INTRAVENOUS
  Filled 2023-03-07: qty 4

## 2023-03-07 MED ORDER — DIPHENHYDRAMINE HCL 25 MG PO CAPS: 25.0000 mg | ORAL_CAPSULE | ORAL | Status: DC | PRN

## 2023-03-07 MED ORDER — POVIDONE-IODINE 10 % EX SWAB
2.0000 | Freq: Once | CUTANEOUS | Status: AC
  Administered 2023-03-07: 2 via TOPICAL

## 2023-03-07 MED ORDER — ONDANSETRON HCL 4 MG/2ML IJ SOLN
4.0000 mg | Freq: Three times a day (TID) | INTRAMUSCULAR | Status: DC | PRN
  Administered 2023-03-07: 4 mg via INTRAVENOUS
  Filled 2023-03-07: qty 2

## 2023-03-07 MED ORDER — FENTANYL CITRATE (PF) 100 MCG/2ML IJ SOLN: 25.0000 ug | INTRAMUSCULAR | Status: DC | PRN

## 2023-03-07 MED ORDER — PHENYLEPHRINE HCL (PRESSORS) 10 MG/ML IV SOLN
INTRAVENOUS | Status: DC | PRN
  Administered 2023-03-07 (×6): 40 ug via INTRAVENOUS
  Administered 2023-03-07: 160 ug via INTRAVENOUS

## 2023-03-07 MED ORDER — IBUPROFEN 600 MG PO TABS
600.0000 mg | ORAL_TABLET | Freq: Four times a day (QID) | ORAL | Status: DC
  Administered 2023-03-08 – 2023-03-10 (×8): 600 mg via ORAL
  Filled 2023-03-07 (×8): qty 1

## 2023-03-07 MED ORDER — MAGNESIUM SULFATE 40 GM/1000ML IV SOLN
2.0000 g/h | INTRAVENOUS | Status: DC
  Filled 2023-03-07: qty 1000

## 2023-03-07 MED ORDER — DIBUCAINE (PERIANAL) 1 % EX OINT: 1.0000 | TOPICAL_OINTMENT | CUTANEOUS | Status: DC | PRN

## 2023-03-07 MED ORDER — ACETAMINOPHEN 500 MG PO TABS: 1000.0000 mg | ORAL_TABLET | Freq: Four times a day (QID) | ORAL | Status: DC

## 2023-03-07 MED ORDER — LABETALOL HCL 5 MG/ML IV SOLN: 80.0000 mg | INTRAVENOUS | Status: DC | PRN

## 2023-03-07 MED ORDER — SIMETHICONE 80 MG PO CHEW
80.0000 mg | CHEWABLE_TABLET | ORAL | Status: DC | PRN
  Administered 2023-03-09: 80 mg via ORAL
  Filled 2023-03-07: qty 1

## 2023-03-07 MED ORDER — ACETAMINOPHEN 10 MG/ML IV SOLN
INTRAVENOUS | Status: DC | PRN
  Administered 2023-03-07: 1000 mg via INTRAVENOUS

## 2023-03-07 MED ORDER — LACTATED RINGERS IV BOLUS
500.0000 mL | Freq: Once | INTRAVENOUS | Status: AC
  Administered 2023-03-07: 500 mL via INTRAVENOUS

## 2023-03-07 MED ORDER — HYDRALAZINE HCL 20 MG/ML IJ SOLN: 10.0000 mg | INTRAMUSCULAR | Status: DC | PRN

## 2023-03-07 MED ORDER — ZOLPIDEM TARTRATE 5 MG PO TABS: 5.0000 mg | ORAL_TABLET | Freq: Every evening | ORAL | Status: DC | PRN

## 2023-03-07 MED ORDER — METOCLOPRAMIDE HCL 5 MG/ML IJ SOLN
INTRAMUSCULAR | Status: AC
  Filled 2023-03-07: qty 2

## 2023-03-07 MED ORDER — KETOROLAC TROMETHAMINE 30 MG/ML IJ SOLN
30.0000 mg | Freq: Once | INTRAMUSCULAR | Status: AC
  Administered 2023-03-07: 30 mg via INTRAVENOUS

## 2023-03-07 MED ORDER — MAGNESIUM HYDROXIDE 400 MG/5ML PO SUSP: 30.0000 mL | ORAL | Status: DC | PRN

## 2023-03-07 MED ORDER — KETOROLAC TROMETHAMINE 30 MG/ML IJ SOLN
30.0000 mg | Freq: Four times a day (QID) | INTRAMUSCULAR | Status: AC
  Administered 2023-03-07 – 2023-03-08 (×3): 30 mg via INTRAVENOUS
  Filled 2023-03-07 (×3): qty 1

## 2023-03-07 MED ORDER — DEXMEDETOMIDINE HCL IN NACL 80 MCG/20ML IV SOLN
INTRAVENOUS | Status: DC | PRN
  Administered 2023-03-07: 8 ug via INTRAVENOUS

## 2023-03-07 MED ORDER — DROPERIDOL 2.5 MG/ML IJ SOLN: 0.6250 mg | Freq: Once | INTRAMUSCULAR | Status: DC | PRN

## 2023-03-07 MED ORDER — WITCH HAZEL-GLYCERIN EX PADS: 1.0000 | MEDICATED_PAD | CUTANEOUS | Status: DC | PRN

## 2023-03-07 MED ORDER — LABETALOL HCL 5 MG/ML IV SOLN: 20.0000 mg | INTRAVENOUS | Status: DC | PRN

## 2023-03-07 MED ORDER — DEXAMETHASONE SODIUM PHOSPHATE 10 MG/ML IJ SOLN
INTRAMUSCULAR | Status: AC
  Filled 2023-03-07: qty 1

## 2023-03-07 MED ORDER — MORPHINE SULFATE (PF) 0.5 MG/ML IJ SOLN
INTRAMUSCULAR | Status: DC | PRN
  Administered 2023-03-07: 150 ug via INTRATHECAL

## 2023-03-07 MED ORDER — NALOXONE HCL 0.4 MG/ML IJ SOLN: 0.4000 mg | INTRAMUSCULAR | Status: DC | PRN

## 2023-03-07 MED ORDER — ACETAMINOPHEN 10 MG/ML IV SOLN
INTRAVENOUS | Status: AC
  Filled 2023-03-07: qty 100

## 2023-03-07 MED ORDER — NALOXONE HCL 4 MG/10ML IJ SOLN: 1.0000 ug/kg/h | INTRAVENOUS | Status: DC | PRN

## 2023-03-07 MED ORDER — CEFAZOLIN SODIUM-DEXTROSE 2-4 GM/100ML-% IV SOLN
2.0000 g | INTRAVENOUS | Status: AC
  Administered 2023-03-07: 2 g via INTRAVENOUS
  Filled 2023-03-07: qty 100

## 2023-03-07 MED ORDER — OXYTOCIN-SODIUM CHLORIDE 30-0.9 UT/500ML-% IV SOLN
INTRAVENOUS | Status: AC
  Filled 2023-03-07: qty 500

## 2023-03-07 MED ORDER — ONDANSETRON HCL 4 MG/2ML IJ SOLN
INTRAMUSCULAR | Status: DC | PRN
  Administered 2023-03-07: 4 mg via INTRAVENOUS

## 2023-03-07 SURGICAL SUPPLY — 33 items
APL PRP STRL LF DISP 70% ISPRP (MISCELLANEOUS) ×2
CHLORAPREP W/TINT 26 (MISCELLANEOUS) ×2 IMPLANT
CLAMP UMBILICAL CORD (MISCELLANEOUS) ×1 IMPLANT
CLOTH BEACON ORANGE TIMEOUT ST (SAFETY) ×1 IMPLANT
DRSG OPSITE POSTOP 4X10 (GAUZE/BANDAGES/DRESSINGS) ×1 IMPLANT
ELECT REM PT RETURN 9FT ADLT (ELECTROSURGICAL) ×1
ELECTRODE REM PT RTRN 9FT ADLT (ELECTROSURGICAL) ×1 IMPLANT
EXTRACTOR VACUUM KIWI (MISCELLANEOUS) IMPLANT
GAUZE SPONGE 4X4 12PLY STRL LF (GAUZE/BANDAGES/DRESSINGS) IMPLANT
GLOVE BIOGEL PI IND STRL 7.0 (GLOVE) ×2 IMPLANT
GLOVE ECLIPSE 7.0 STRL STRAW (GLOVE) ×1 IMPLANT
GOWN STRL REUS W/TWL LRG LVL3 (GOWN DISPOSABLE) ×2 IMPLANT
KIT ABG SYR 3ML LUER SLIP (SYRINGE) ×1 IMPLANT
NDL HYPO 25X5/8 SAFETYGLIDE (NEEDLE) ×1 IMPLANT
NEEDLE HYPO 25X5/8 SAFETYGLIDE (NEEDLE) ×1 IMPLANT
NS IRRIG 1000ML POUR BTL (IV SOLUTION) ×1 IMPLANT
PACK C SECTION WH (CUSTOM PROCEDURE TRAY) ×1 IMPLANT
PAD ABD DERMACEA PRESS 5X9 (GAUZE/BANDAGES/DRESSINGS) IMPLANT
PAD OB MATERNITY 4.3X12.25 (PERSONAL CARE ITEMS) ×1 IMPLANT
RTRCTR C-SECT PINK 25CM LRG (MISCELLANEOUS) ×1 IMPLANT
SUT MNCRL 0 VIOLET CTX 36 (SUTURE) ×1 IMPLANT
SUT PLAIN 0 NONE (SUTURE) IMPLANT
SUT PLAIN 2 0 (SUTURE)
SUT PLAIN 2 0 XLH (SUTURE) IMPLANT
SUT PLAIN ABS 2-0 CT1 27XMFL (SUTURE) IMPLANT
SUT VIC AB 0 CTX 36 (SUTURE) ×1
SUT VIC AB 0 CTX36XBRD ANBCTRL (SUTURE) ×1 IMPLANT
SUT VIC AB 2-0 CT1 27 (SUTURE)
SUT VIC AB 2-0 CT1 TAPERPNT 27 (SUTURE) IMPLANT
SUT VIC AB 4-0 KS 27 (SUTURE) ×1 IMPLANT
TOWEL OR 17X24 6PK STRL BLUE (TOWEL DISPOSABLE) ×1 IMPLANT
TRAY FOLEY W/BAG SLVR 14FR LF (SET/KITS/TRAYS/PACK) IMPLANT
WATER STERILE IRR 1000ML POUR (IV SOLUTION) ×1 IMPLANT

## 2023-03-07 NOTE — MAU Provider Note (Signed)
History     CSN: [id number]  Arrival date and time: 03/07/23 0905   None     Chief Complaint  Patient presents with   Headache   Hypertension   HPI This is a 32 year old G6 P1-0-4-1 at 77 weeks today with a pregnancy complicated by history of migraines, chronic hypertension.  Her blood pressures have been fairly well-controlled except for an elevated blood pressures in the MAU on 6/5.  She has been having intermittent headaches over the weekend which improves with Tylenol and caffeine.  No vision changes.  Good fetal movement.  She called the office due to elevated blood pressures today and was directed to come to the hospital for evaluation.  OB History     Gravida  6   Para  1   Term  1   Preterm      AB  4   Living  1      SAB  2   IAB      Ectopic  2   Multiple  0   Live Births  1        Obstetric Comments  C/s for fail to descent, pushed for 3 hrs         Past Medical History:  Diagnosis Date   ADHD (attention deficit hyperactivity disorder)    No meds   Cervical dysplasia    Chlamydia    Complication of anesthesia    woke up during wisdom teeth and had PVCs   Head injury with loss of consciousness (HCC) 03/2020   Hypertension    Hypoglycemia    Migraine    Propanolol 10 mg   OCD (obsessive compulsive disorder)    Ovarian cyst    PTSD (post-traumatic stress disorder)    no meds   PVC's (premature ventricular contractions) 2018   has palpations when she has PV's.   Sinus tachycardia    UTI (urinary tract infection)     Past Surgical History:  Procedure Laterality Date   CESAREAN SECTION N/A 07/18/2018   Procedure: CESAREAN SECTION;  Surgeon: Essie Hart, MD;  Location: [facility] BIRTHING SUITES;  Service: Obstetrics;  Laterality: N/A;   DIAGNOSTIC LAPAROSCOPY WITH REMOVAL OF ECTOPIC PREGNANCY Left 12/20/2020   Procedure: LAPAROSCOPIC LEFT SALPINGECTOMY WITH REMOVAL OF ECTOPIC PREGNANCY;  Surgeon: [name], MD;  Location: [facility] OR;   Service: Gynecology;  Laterality: Left;   DILATION AND CURETTAGE OF UTERUS N/A 07/01/2021   Procedure: DILATATION AND CURETTAGE WITH PATHOLOGY;  Surgeon: Milas Hock, MD;  Location: [facility] OR;  Service: Gynecology;  Laterality: N/A;   SALPINGECTOMY Left 12/2020   WISDOM TOOTH EXTRACTION      Family History  Problem Relation Age of Onset   Diabetes Mother    Cancer Mother        stage 2 breast   ADD / ADHD Mother    Anxiety disorder Mother    Depression Mother    Hyperlipidemia Mother    Obesity Mother    Depression Father    ADD / ADHD Father    Anxiety disorder Sister    Asthma Sister    Depression Sister    ADD / ADHD Sister    Intellectual disability Brother    Learning disabilities Brother    Anxiety disorder Maternal Aunt    Diabetes Maternal Aunt    Alcohol abuse Maternal Uncle    Hyperlipidemia Maternal Uncle    Anxiety disorder Paternal Uncle    Diabetes Paternal Uncle    Anxiety  disorder Maternal Grandmother    Diabetes Maternal Grandmother    Hyperlipidemia Maternal Grandmother    Varicose Veins Maternal Grandmother    Cancer Maternal Grandfather    Hyperlipidemia Maternal Grandfather    Obesity Maternal Grandfather    Vision loss Maternal Grandfather    Cancer Paternal Grandmother     Social History   Tobacco Use   Smoking status: Never   Smokeless tobacco: Never  Vaping Use   Vaping Use: Never used  Substance Use Topics   Alcohol use: No   Drug use: No    Allergies:  Allergies  Allergen Reactions   Banana Swelling   Latex Swelling   Peanut-Containing Drug Products Anaphylaxis   Hpv 4-Valent Vaccine Recombinant Vaccine Other (See Comments)   Hpv Bival (Type 16,18) Recomb Vaccine  [Human Papillomavirus (16,18) Recomb Vac] Hives   Iodinated Contrast Media Diarrhea, Palpitations and Other (See Comments)   Peanut Oil Swelling    Tongue swells/itching   Pumpkin Flavor Swelling   Milk (Cow) Other (See Comments)   Lactose Intolerance (Gi) Other  (See Comments)    Gi upset    Medications Prior to Admission  Medication Sig Dispense Refill Last Dose   acetaminophen (TYLENOL) 500 MG tablet Take 1,000 mg by mouth every 6 (six) hours as needed.   03/06/2023   aspirin EC 81 MG tablet Take 1 tablet (81 mg total) by mouth daily. Swallow whole. 30 tablet 12 03/06/2023   cetirizine (ZYRTEC) 10 MG chewable tablet Chew 10 mg by mouth daily.   03/06/2023   labetalol (NORMODYNE) 100 MG tablet Take 100 mg by mouth daily.   03/06/2023   ondansetron (ZOFRAN) 4 MG tablet Take 1 tablet (4 mg total) by mouth every 8 (eight) hours as needed for nausea or vomiting. 20 tablet 0 Past Month   Prenatal Multivit-Min-Fe-FA (PRENATAL 1 + IRON PO)    03/06/2023   amoxicillin (AMOXIL) 875 MG tablet Take by mouth.      cyclobenzaprine (FLEXERIL) 10 MG tablet Take 1 tablet (10 mg total) by mouth 3 (three) times daily as needed for muscle spasms. (Patient not taking: Reported on 02/23/2023) 30 tablet 2    famotidine (PEPCID) 20 MG tablet Take 20 mg by mouth 2 (two) times daily.       Review of Systems Physical Exam   Blood pressure (!) 161/106, pulse (!) 126, temperature 98.5 F (36.9 C), temperature source Oral, resp. rate (!) 22, last menstrual period 06/14/2022, SpO2 98 %.  Physical Exam Vitals and nursing note reviewed.  Constitutional:      Appearance: She is well-developed.  Cardiovascular:     Rate and Rhythm: Normal rate and regular rhythm.  Pulmonary:     Effort: Pulmonary effort is normal.  Abdominal:     Palpations: Abdomen is soft.  Neurological:     Mental Status: She is alert.  Psychiatric:        Mood and Affect: Mood normal.        Speech: Speech normal.        Behavior: Behavior normal.     MAU Course  Procedures  MDM Preeclampsia labs.  As patient having CHTN with elevated BP, will move toward delivery.  Assessment and Plan  NPO at 7am. Plans on condoms for birth control.  The risks of cesarean section discussed with the  patient included but were not limited to: bleeding which may require transfusion or reoperation; infection which may require antibiotics; injury to bowel, bladder, ureters or other surrounding  organs; injury to the fetus; need for additional procedures including hysterectomy in the event of a life-threatening hemorrhage; placental abnormalities wth subsequent pregnancies, incisional problems, thromboembolic phenomenon and other postoperative/anesthesia complications. The patient concurred with the proposed plan, giving informed written consent for the procedure.   Patient has been NPO since 7am she will remain NPO for procedure. Anesthesia and OR aware.  Preoperative prophylactic Ancef ordered on call to the OR.  To OR when ready.  Levie Heritage, DO 03/07/2023 9:59 AM   Levie Heritage 03/07/2023, 9:56 AM

## 2023-03-07 NOTE — Anesthesia Procedure Notes (Signed)
Spinal  Patient location during procedure: OR Start time: 03/07/2023 2:08 PM End time: 03/07/2023 2:11 PM Reason for block: surgical anesthesia Staffing Performed: anesthesiologist  Anesthesiologist: Kaylyn Layer, MD Performed by: Kaylyn Layer, MD Authorized by: Kaylyn Layer, MD   Preanesthetic Checklist Completed: patient identified, IV checked, risks and benefits discussed, monitors and equipment checked, pre-op evaluation and timeout performed Spinal Block Patient position: sitting Prep: DuraPrep and site prepped and draped Patient monitoring: heart rate, continuous pulse ox and blood pressure Approach: midline Location: L3-4 Injection technique: single-shot Needle Needle type: Pencan  Needle gauge: 24 G Needle length: 10 cm Assessment Sensory level: T4 Events: CSF return Additional Notes Risks, benefits, and alternative discussed. Patient gave consent to procedure. Prepped and draped in sitting position. Clear CSF obtained after one needle pass. Positive terminal aspiration. No pain or paraesthesias with injection. Patient tolerated procedure well. Vital signs stable. Amalia Greenhouse, MD

## 2023-03-07 NOTE — Telephone Encounter (Cosign Needed)
Pt called stating that she has had a headache since Sat and her BP is 152/95 and 152/105.  She states that her facial, hand, lip and feet swelling.  She also states that she feels her heart beating in her head.  She denies any visual changes.  I suggested that she go to [facility] labs and evaluation.  She is currently [redacted] weeks gestation.

## 2023-03-07 NOTE — Transfer of Care (Signed)
Immediate Anesthesia Transfer of Care Note  Patient: [name]  Procedure(s) Performed: CESAREAN SECTION  Patient Location: PACU  Anesthesia Type:Spinal  Level of Consciousness: awake, alert , and oriented  Airway & Oxygen Therapy: Patient Spontanous Breathing  Post-op Assessment: Report given to RN and Post -op Vital signs reviewed and stable  Post vital signs: Reviewed and stable  Last Vitals:  Vitals Value Taken Time  BP 130/93 03/07/23 1527  Temp    Pulse 100 03/07/23 1529  Resp 27 03/07/23 1529  SpO2 99 % 03/07/23 1529  Vitals shown include unvalidated device data.  Last Pain:  Vitals:   03/07/23 1254  TempSrc: Oral  PainSc:          Complications: No notable events documented.

## 2023-03-07 NOTE — MAU Note (Signed)
At 1300 patient states she felt like she was going to pass out, MD notified of drop in patient BP and that she is symptomatic. RN told to discontinue Magnesium bolus, and begin continuous rate. Repeat BP reading at 1305 was 75/39. Magnesium infusion discontinued IVF bolus started. Dr. Adrian Blackwater at bedside, requesting to have calcium gluconate at bedside in case patient continues to be symptomatic. Patient currently refusing additional magnesium infusion.
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
## 2023-03-07 NOTE — MAU Note (Signed)
.  Susan Krause is a 32 y.o. at [redacted]w[redacted]d here in MAU reporting: woke up this morning at 0400 with a headache. States she checked her BP several times this morning and all were over 140s/90s with the highest being 152/105. Denies visual disturbances or epigastric pain. NO VB or LOF. +FM. Has not taken anything for her headache.   Pain score: 5 Vitals:   03/07/23 0933 03/07/23 0945  BP: (!) 141/94 (!) 161/106  Pulse: (!) 120 (!) 126  Resp: (!) 22   Temp: 98.5 F (36.9 C)   SpO2:  98%     FHT:158 Lab orders placed from triage:  UA

## 2023-03-07 NOTE — Anesthesia Postprocedure Evaluation (Signed)
Anesthesia Post Note  Patient: [name]  Procedure(s) Performed: CESAREAN SECTION     Patient location during evaluation: PACU Anesthesia Type: Spinal Level of consciousness: awake and alert Pain management: pain level controlled Vital Signs Assessment: post-procedure vital signs reviewed and stable Respiratory status: spontaneous breathing, nonlabored ventilation and respiratory function stable Cardiovascular status: blood pressure returned to baseline Postop Assessment: no apparent nausea or vomiting, no headache, no backache and spinal receding Anesthetic complications: no   No notable events documented.  Last Vitals:  Vitals:   03/07/23 1545 03/07/23 1600  BP: 99/83 113/71  Pulse: 91 93  Resp: 17 13  Temp:    SpO2: 97% 99%    Last Pain:  Vitals:   03/07/23 1530  TempSrc: Temporal  PainSc:    Pain Goal:    LLE Motor Response: No movement due to regional block (03/07/23 1555) LLE Sensation: Numbness (03/07/23 1555) RLE Motor Response: No movement due to regional block (03/07/23 1555) RLE Sensation: Numbness (03/07/23 1555)     Epidural/Spinal Function Cutaneous sensation: No Sensation (03/07/23 1555), Patient able to flex knees: No (03/07/23 1555), Patient able to lift hips off bed: No (03/07/23 1555), Back pain beyond tenderness at insertion site: No (03/07/23 1555), Progressively worsening motor and/or sensory loss: No (03/07/23 1555), Bowel and/or bladder incontinence post epidural: No (03/07/23 1555)  Susan Krause

## 2023-03-07 NOTE — Anesthesia Preprocedure Evaluation (Addendum)
Anesthesia Evaluation  Patient identified by MRN, date of birth, ID band Patient awake    Reviewed: Allergy & Precautions, NPO status , Patient's Chart, lab work & pertinent test results, reviewed documented beta blocker date and time   History of Anesthesia Complications Negative for: history of anesthetic complications  Airway Mallampati: II  TM Distance: >3 FB Neck ROM: Full    Dental no notable dental hx.    Pulmonary neg pulmonary ROS   Pulmonary exam normal        Cardiovascular hypertension, Pt. on medications and Pt. on home beta blockers Normal cardiovascular exam     Neuro/Psych  Headaches  Anxiety        GI/Hepatic negative GI ROS, Neg liver ROS,,,  Endo/Other  negative endocrine ROS    Renal/GU negative Renal ROS     Musculoskeletal negative musculoskeletal ROS (+)    Abdominal   Peds  Hematology negative hematology ROS (+)   Anesthesia Other Findings Day of surgery medications reviewed with patient.  Reproductive/Obstetrics (+) Pregnancy (Hx of C/S x1)                              Anesthesia Physical Anesthesia Plan  ASA: 3  Anesthesia Plan: Spinal   Post-op Pain Management: Ofirmev IV (intra-op)*   Induction:   PONV Risk Score and Plan: 4 or greater and Treatment may vary due to age or medical condition, Ondansetron and Dexamethasone  Airway Management Planned: Natural Airway  Additional Equipment: None  Intra-op Plan:   Post-operative Plan:   Informed Consent: I have reviewed the patients History and Physical, chart, labs and discussed the procedure including the risks, benefits and alternatives for the proposed anesthesia with the patient or authorized representative who has indicated his/her understanding and acceptance.       Plan Discussed with: CRNA  Anesthesia Plan Comments: (Repeat C/S x1. Ate cereal at 0745 today. Will proceed at 1345 or earlier  if clinical indication. Stephannie Peters, MD)        Anesthesia Quick Evaluation

## 2023-03-07 NOTE — Op Note (Deleted)
[name] PROCEDURE DATE: 03/07/2023  PREOPERATIVE DIAGNOSES: Intrauterine pregnancy at [redacted]w[redacted]d weeks gestation; patient declines vag del attempt and Pre-Eclampsia w/ severe features  POSTOPERATIVE DIAGNOSES: The same, viable infant delivered  PROCEDURE: Repeat Low Transverse Cesarean Section  SURGEON:  Dr. Merian Capron  ASSISTANT:  Myrtie Hawk, DO An experienced assistant was required given the standard of surgical care given the complexity of the case.  This assistant was needed for exposure, dissection, suctioning, retraction, instrument exchange, assisting with delivery with administration of fundal pressure, and for overall help during the procedure.  ANESTHESIOLOGY TEAM: Anesthesiologist: Kaylyn Layer, MD CRNA: Graciela Husbands, CRNA Student Nurse Anesthetist: Suzzanne Cloud, RN  INDICATIONS: Susan Krause is a 32 y.o. [phone] at [redacted]w[redacted]d here for cesarean section secondary to the indications listed under preoperative diagnoses; please see preoperative note for further details.  The risks of surgery were discussed with the patient including but were not limited to: bleeding which may require transfusion or reoperation; infection which may require antibiotics; injury to bowel, bladder, ureters or other surrounding organs; injury to the fetus; need for additional procedures including hysterectomy in the event of a life-threatening hemorrhage; formation of adhesions; placental abnormalities wth subsequent pregnancies; incisional problems; thromboembolic phenomenon and other postoperative/anesthesia complications.  The patient concurred with the proposed plan, giving informed written consent for the procedure.    FINDINGS:  Viable female infant in cephalic presentation.  Apgars 9 and 9.  Amniotic fluid: clear.  Intact placenta, three vessel cord.  Normal uterus, fallopian tubes and ovaries bilaterally.  ANESTHESIA: spinal INTRAVENOUS FLUIDS: 1200 ml   ESTIMATED BLOOD LOSS: 422  ml URINE OUTPUT:  500 ml SPECIMENS: Placenta sent to L&D . COMPLICATIONS: None immediate  PROCEDURE IN DETAIL:  The patient preoperatively received intravenous antibiotics and had sequential compression devices applied to her lower extremities.  She was then taken to the operating room where spinal anesthesia was found to be adequate. She was then placed in a dorsal supine position with a leftward tilt, and prepped and draped in a sterile manner.  A foley catheter was  placed into her bladder and attached to constant gravity.  After an adequate timeout was performed, a Pfannenstiel skin incision was made with scalpel and carried through to the underlying layer of fascia. The fascia was incised in the midline, and this incision was extended sharply with mayo scissors. The rectus muscles were separated in the midline and the peritoneum was entered bluntly.   The Alexis self-retaining retractor was introduced into the abdominal cavity.  Attention was turned to the lower uterine segment where a low transverse hysterotomy was made with a scalpel and extended bluntly in caudad and cephalad directions.  The infant was successfully delivered, the cord was clamped and cut after one minute, and the infant was handed over to the awaiting neonatology team. Uterine massage was then administered, and the placenta delivered intact with a three-vessel cord. The uterus was then cleared of clots and debris.  The hysterotomy was closed with 0-Monocryl in a running fashion.    The pelvis was cleared of all clot and debris. Hemostasis was confirmed on all surfaces. The uterus and the incision was once again inspected and found to be hemostatic. The retractor was removed.  The peritoneum was closed with a 2-0 Vicryl running stitch. The fascia was then closed using 0 Vicryl in a running fashion.  The subcutaneous layer was irrigated, any areas of bleeding were cauterized with the bovie,  was reapproximated with 2-0 plain  gut in  a running fashion, was found to be hemostatic.. . The skin was closed with a 4-0 Vicryl subcuticular stitch. The patient tolerated the procedure well. Sponge, instrument and needle counts were correct x 3.  She was taken to the recovery room in stable condition.   Myrtie Hawk, DO FMOB Fellow, Faculty practice [facility], Center for [facility] 03/07/23  6:32 PM

## 2023-03-07 NOTE — Discharge Summary (Signed)
Postpartum Discharge Summary  Patient Name: Susan Krause DOB: 08-Dec-1990 MRN: [id number]  Date of admission: 03/07/2023 Delivery date:03/07/2023  Delivering provider: Venora Maples  Date of discharge: 03/10/2023  Admitting diagnosis: Preeclampsia [O14.90] Status post repeat low transverse cesarean section [Z98.891] Intrauterine pregnancy: [redacted]w[redacted]d     Secondary diagnosis:  Principal Problem:   Status post repeat low transverse cesarean section Active Problems:   Chronic post-traumatic stress disorder (PTSD)   Supervision of high-risk pregnancy   Chronic hypertension in pregnancy   Maternal morbid obesity, antepartum (HCC)   Preeclampsia  Additional problems: None    Discharge diagnosis: Term Pregnancy Delivered and Preeclampsia (severe)                                              Post partum procedures: none Augmentation: N/A Complications: None  Hospital course: Sceduled C/S   32 y.o. yo [id number] at [redacted]w[redacted]d was admitted to the hospital 03/07/2023 for scheduled cesarean section with the following indication:Elective Repeat and PreEclampsia with severe features .Delivery details are as follows:  Membrane Rupture Time/Date:  ,   Delivery Method:C-Section, Low Transverse  Details of operation can be found in separate operative note.  Patient had an uncomplicated postpartum course. She declined postpartum magnesium due to hypotension with prior doses. Her BP was controlled with labetalol 100mg  daily. She received lasix and will continue x 5d PP.  She is ambulating, tolerating a regular diet, passing flatus, and urinating well. Patient is discharged home in stable condition on  03/10/23  Of note, pt declined postpartum oxycodone prescription so is eligible for a postpartum prescription if needed.         Newborn Data: Birth date:03/07/2023  Birth time:2:37 PM  Gender:Female  Living status:Living  Apgars:9 ,9  Weight:3440 g     Magnesium Sulfate received: No BMZ received:  No Rhophylac:N/A MMR:N/A T-DaP:Given prenatally Flu: No Transfusion:No  Physical exam  Vitals:   03/09/23 2010 03/09/23 2337 03/10/23 0510 03/10/23 0800  BP: 129/84 129/80 129/86 136/84  Pulse: 95 81 88 87  Resp: 16 17 18 18   Temp: 97.9 F (36.6 C) 98.3 F (36.8 C) 98.8 F (37.1 C) 98.1 F (36.7 C)  TempSrc: Oral Oral Oral Oral  SpO2: 99% 99% 99% 98%   General: alert, cooperative, and no distress Lochia: appropriate Uterine Fundus: firm Incision: Dressing is clean, dry, and intact DVT Evaluation: No evidence of DVT seen on physical exam. Labs: Lab Results  Component Value Date   WBC 19.3 (H) 03/08/2023   HGB 10.2 (L) 03/08/2023   HCT 30.8 (L) 03/08/2023   MCV 85.8 03/08/2023   PLT 252 03/08/2023      Latest Ref Rng & Units 03/08/2023    6:54 AM  CMP  Glucose 70 - 99 mg/dL 84   BUN 6 - 20 mg/dL 8   Creatinine 5.78 - 4.69 mg/dL 6.29   Sodium 528 - 413 mmol/L 135   Potassium 3.5 - 5.1 mmol/L 4.0   Chloride 98 - 111 mmol/L 104   CO2 22 - 32 mmol/L 23   Calcium 8.9 - 10.3 mg/dL 8.6   Total Protein 6.5 - 8.1 g/dL 5.1   Total Bilirubin 0.3 - 1.2 mg/dL 0.5   Alkaline Phos 38 - 126 U/L 81   AST 15 - 41 U/L 20   ALT 0 - 44 U/L 18    [place]  Score:    03/08/2023    3:50 PM  Edinburgh Postnatal Depression Scale Screening Tool  I have been able to laugh and see the funny side of things. 0  I have looked forward with enjoyment to things. 0  I have blamed myself unnecessarily when things went wrong. 1  I have been anxious or worried for no good reason. 2  I have felt scared or panicky for no good reason. 2  Things have been getting on top of me. 1  I have been so unhappy that I have had difficulty sleeping. 1  I have felt sad or miserable. 1  I have been so unhappy that I have been crying. 0  The thought of harming myself has occurred to me. 0  Edinburgh Postnatal Depression Scale Total 8     After visit meds:  Allergies as of 03/10/2023       Reactions    Banana Swelling   Latex Swelling   Peanut-containing Drug Products Anaphylaxis   Hpv Bival (type 16,18) Recomb Vaccine  [human Papillomavirus (16,18) Recomb Vac] Hives   Iodinated Contrast Media Diarrhea, Palpitations, Other (See Comments)   Pumpkin Flavor Swelling   Lactose Intolerance (gi) Other (See Comments)   Gi upset        Medication List     STOP taking these medications    amoxicillin 875 MG tablet Commonly known as: AMOXIL   aspirin EC 81 MG tablet   cyclobenzaprine 10 MG tablet Commonly known as: FLEXERIL   ondansetron 4 MG tablet Commonly known as: Zofran       TAKE these medications    acetaminophen 500 MG tablet Commonly known as: TYLENOL Take 2 tablets (1,000 mg total) by mouth every 6 (six) hours as needed.   cetirizine 10 MG chewable tablet Commonly known as: ZYRTEC Chew 10 mg by mouth daily.   famotidine 20 MG tablet Commonly known as: PEPCID Take 20 mg by mouth 2 (two) times daily.   furosemide 20 MG tablet Commonly known as: LASIX Take 1 tablet (20 mg total) by mouth daily.   ibuprofen 600 MG tablet Commonly known as: ADVIL Take 1 tablet (600 mg total) by mouth every 6 (six) hours.   labetalol 100 MG tablet Commonly known as: NORMODYNE Take 100 mg by mouth daily.   PRENATAL 1 + IRON PO   senna-docusate 8.6-50 MG tablet Commonly known as: Senokot-S Take 2 tablets by mouth at bedtime as needed for mild constipation.   simethicone 80 MG chewable tablet Commonly known as: MYLICON Chew 1 tablet (80 mg total) by mouth as needed for flatulence.               Discharge Care Instructions  (From admission, onward)           Start     Ordered   03/10/23 0000  Discharge wound care:       Comments: Remove honeycomb dressing by Monday   03/10/23 0838             Discharge home in stable condition Infant Feeding: Breast Infant Disposition:home with mother Discharge instruction: per After Visit Summary and Postpartum  booklet. Activity: Advance as tolerated. Pelvic rest for 6 weeks.  Diet: routine diet Future Appointments:No future appointments. Follow up Visit: Message sent to [facility] 6/24  Please schedule this patient for a In person postpartum visit in 6 weeks with the following provider: Any provider. Additional Postpartum F/U:Postpartum Depression checkup, Incision check 1 week, and BP check  1 week  High risk pregnancy complicated by: HTN Delivery mode:  C-Section, Low Transverse  Anticipated Birth Control:   Declined  03/10/2023 Lennart Pall, MD

## 2023-03-07 NOTE — Op Note (Signed)
Operative Note   Patient: Hollyn Stucky  Date of Procedure: 03/07/2023  Procedure: Repeat Low Transverse Cesarean   Indications: patient declines vag del attempt, elective repeat, previous uterine incision: low transverse and severe preeclampsia  Pre-operative Diagnosis: previous.   Post-operative Diagnosis: Same  TOLAC Candidate: Yes (though history of two priors at this point)  Surgeon: Surgeon(s) and Role:    * Crissie Reese, Mary Sella, MD - Primary    * Mercado-Ortiz, Lahoma Crocker, DO - Fellow  An experienced assistant was required given the standard of surgical care given the complexity of the case.  This assistant was needed for exposure, dissection, suctioning, retraction, instrument exchange, assisting with delivery with administration of fundal pressure, and for overall help during the procedure.   Anesthesia: spinal  Anesthesiologist: No responsible provider has been recorded for the case.   Antibiotics: Cefazolin   Estimated Blood Loss: 422 ml   Total IV Fluids: 300 ml  Urine Output:  500 cc OF clear urine  Specimens: none   Complications: no complications   Indications: Izela Altier is a 32 y.o. [id number] with an IUP [redacted]w[redacted]d presenting for unscheduled, urgent cesarean secondary to the indications listed above. Clinical course notable for presentation to MAU with signs and symptoms of severe pre-eclampsia.  The risks of cesarean section discussed with the patient included but were not limited to: bleeding which may require transfusion or reoperation; infection which may require antibiotics; injury to bowel, bladder, ureters or other surrounding organs; injury to the fetus; need for additional procedures including hysterectomy in the event of a life-threatening hemorrhage; placental abnormalities with subsequent pregnancies, incisional problems, thromboembolic phenomenon and other postoperative/anesthesia complications. The patient concurred with the proposed plan, giving informed  written consent for the procedure. Patient NPO status waived given urgency of case. Anesthesia and OR aware. Preoperative prophylactic antibiotics and SCDs ordered on call to the OR.   Findings: Viable infant in cephalic presentation, no nuchal cord present. Apgars 9 , 9 , . Weight 3440 g . Clear amniotic fluid. Normal placenta, three vessel cord. Normal uterus, Normal right and Surgically absent left fallopian tubes, Normal bilateral ovaries. Minimal adhesive disease.  Procedure Details: A Time Out was held and the above information confirmed. The patient received intravenous antibiotics and had sequential compression devices applied to her lower extremities preoperatively. The patient was taken back to the operative suite where spinal anesthesia was administered. After induction of anesthesia, the patient was draped and prepped in the usual sterile manner and placed in a dorsal supine position with a leftward tilt. A low transverse skin incision was made with scalpel and carried down through the subcutaneous tissue to the fascia. Fascial incision was made and extended transversely. The fascia was separated from the underlying rectus tissue superiorly and inferiorly. The rectus muscles were separated in the midline bluntly and the peritoneum was entered bluntly. An Alexis retractor was placed to aid in visualization of the uterus. A bladder flap was not developed. A low transverse uterine incision was made. The infant was successfully delivered from cephalic presentation, the umbilical cord was clamped after 1 minute. Cord ph was not sent, and cord blood was obtained for evaluation. The placenta was removed Intact and appeared normal. The uterine incision was closed with a single layer running unlocked suture of 0-Monocryl. Overall, excellent hemostasis was noted. The abdomen and the pelvis were cleared of all clot and debris and the Jon Gills was removed. Hemostasis was confirmed on all surfaces.  The peritoneum  was reapproximated using 2-0  vicryl . The fascia was then closed using 0 Vicryl in a running fashion. The subcutaneous layer was reapproximated with 2-0 plain gut suture. The skin was closed with a 4-0 vicryl subcuticular stitch. The patient tolerated the procedure well. Sponge, lap, instrument and needle counts were correct x 2. She was taken to the recovery room in stable condition.  Disposition: PACU - hemodynamically stable.    Signed: Venora Maples, MD/MPH Attending Family Medicine Physician, [facility] for [facility], [facility]

## 2023-03-08 ENCOUNTER — Encounter (HOSPITAL_COMMUNITY)
Admission: RE | Admit: 2023-03-08 | Discharge: 2023-03-08 | Disposition: A | Discharge status: Home / Self Care | Payer: BC Managed Care – PPO | Source: Ambulatory Visit | Attending: Physician Assistant | Admitting: Physician Assistant

## 2023-03-08 HISTORY — DX: Other complications of anesthesia, initial encounter: T88.59XA

## 2023-03-08 LAB — COMPREHENSIVE METABOLIC PANEL
ALT: 18 U/L (ref 0–44)
AST: 20 U/L (ref 15–41)
Albumin: 2.1 g/dL — ABNORMAL LOW (ref 3.5–5.0)
Alkaline Phosphatase: 81 U/L (ref 38–126)
Anion gap: 8 (ref 5–15)
BUN: 8 mg/dL (ref 6–20)
CO2: 23 mmol/L (ref 22–32)
Calcium: 8.6 mg/dL — ABNORMAL LOW (ref 8.9–10.3)
Chloride: 104 mmol/L (ref 98–111)
Creatinine, Ser: 0.54 mg/dL (ref 0.44–1.00)
GFR, Estimated: >60 mL/min (ref >60)
Glucose, Bld: 84 mg/dL (ref 70–99)
Potassium: 4 mmol/L (ref 3.5–5.1)
Sodium: 135 mmol/L (ref 135–145)
Total Bilirubin: 0.5 mg/dL (ref 0.3–1.2)
Total Protein: 5.1 g/dL — ABNORMAL LOW (ref 6.5–8.1)

## 2023-03-08 LAB — CBC
HCT: 30.8 % — ABNORMAL LOW (ref 36.0–46.0)
Hemoglobin: 10.2 g/dL — ABNORMAL LOW (ref 12.0–15.0)
MCH: 28.4 pg (ref 26.0–34.0)
MCHC: 33.1 g/dL (ref 30.0–36.0)
MCV: 85.8 fL (ref 80.0–100.0)
Platelets: 252 10*3/uL (ref 150–400)
RBC: 3.59 MIL/uL — ABNORMAL LOW (ref 3.87–5.11)
RDW: 13.8 % (ref 11.5–15.5)
WBC: 19.3 10*3/uL — ABNORMAL HIGH (ref 4.0–10.5)
nRBC: 0 % (ref 0.0–0.2)

## 2023-03-08 MED ORDER — LABETALOL HCL 100 MG PO TABS
100.0000 mg | ORAL_TABLET | Freq: Every day | ORAL | Status: DC
  Administered 2023-03-08 – 2023-03-10 (×3): 100 mg via ORAL
  Filled 2023-03-08 (×3): qty 1

## 2023-03-08 MED ORDER — FUROSEMIDE 20 MG PO TABS
20.0000 mg | ORAL_TABLET | Freq: Every day | ORAL | Status: DC
  Administered 2023-03-08 – 2023-03-10 (×3): 20 mg via ORAL
  Filled 2023-03-08 (×3): qty 1

## 2023-03-08 NOTE — Progress Notes (Signed)
POSTPARTUM PROGRESS NOTE  POD #1  Subjective:  Susan Krause is a 32 y.o. [id number] s/p repeat LTCS at [redacted]w[redacted]d. Today she notes she is doing well reports no acute comlpaints. She denies any problems with ambulating or po intake. Denies nausea or vomiting. She has passed flatus, no BM.  Pain is well controlled.  Lochia minimal.  Foley removed this am, she has not yet voided on her own. Denies fever/chills/chest pain/SOB.  no HA, no blurry vision, no RUQ pain  Objective: Blood pressure 116/81, pulse 76, temperature 98.8 F (37.1 C), temperature source Oral, resp. rate 18, last menstrual period 06/14/2022, SpO2 100 %, unknown if currently breastfeeding.  Physical Exam:  General: alert, cooperative and no distress Chest: no respiratory distress Heart: regular rate and rhythm Abdomen: soft, nontender, +BS Uterine Fundus: firm, appropriately tender Incision: clean and dry with pressure dressing DVT Evaluation: No calf swelling or tenderness Extremities: no edema, SCDs in place Skin: warm, dry  Results for orders placed or performed during the hospital encounter of 03/07/23 (from the past 24 hour(s))  Urinalysis, Routine w reflex microscopic -Urine, Clean Catch     Status: Abnormal   Collection Time: 03/07/23  9:20 AM  Result Value Ref Range   Color, Urine YELLOW YELLOW   APPearance HAZY (A) CLEAR   Specific Gravity, Urine 1.013 1.005 - 1.030   pH 7.0 5.0 - 8.0   Glucose, UA NEGATIVE NEGATIVE mg/dL   Hgb urine dipstick NEGATIVE NEGATIVE   Bilirubin Urine NEGATIVE NEGATIVE   Ketones, ur NEGATIVE NEGATIVE mg/dL   Protein, ur NEGATIVE NEGATIVE mg/dL   Nitrite NEGATIVE NEGATIVE   Leukocytes,Ua NEGATIVE NEGATIVE  Protein / creatinine ratio, urine     Status: None   Collection Time: 03/07/23  9:20 AM  Result Value Ref Range   Creatinine, Urine 87 mg/dL   Total Protein, Urine 11 mg/dL   Protein Creatinine Ratio 0.13 0.00 - 0.15 mg/mg[Cre]  RPR     Status: None   Collection Time: 03/07/23  10:00 AM  Result Value Ref Range   RPR Ser Ql NON REACTIVE NON REACTIVE  Type and screen [facility]     Status: None   Collection Time: 03/07/23 10:00 AM  Result Value Ref Range   ABO/RH(D) O POS    Antibody Screen NEG    Sample Expiration      03/10/2023,2359 Performed at [facility], [address] [place], [place] [address]   CBC     Status: Abnormal   Collection Time: 03/07/23 10:00 AM  Result Value Ref Range   WBC 11.6 (H) 4.0 - 10.5 K/uL   RBC 4.17 3.87 - 5.11 MIL/uL   Hemoglobin 11.9 (L) 12.0 - 15.0 g/dL   HCT 24.8 (L) 25.0 - 03.7 %   MCV 86.1 80.0 - 100.0 fL   MCH 28.5 26.0 - 34.0 pg   MCHC 33.1 30.0 - 36.0 g/dL   RDW 04.8 88.9 - 16.9 %   Platelets 262 150 - 400 K/uL   nRBC 0.0 0.0 - 0.2 %  Comprehensive metabolic panel     Status: Abnormal   Collection Time: 03/07/23 10:00 AM  Result Value Ref Range   Sodium 135 135 - 145 mmol/L   Potassium 4.4 3.5 - 5.1 mmol/L   Chloride 106 98 - 111 mmol/L   CO2 20 (L) 22 - 32 mmol/L   Glucose, Bld 106 (H) 70 - 99 mg/dL   BUN 7 6 - 20 mg/dL  Creatinine, Ser 0.50 0.44 - 1.00 mg/dL   Calcium 9.0 8.9 - 16.1 mg/dL   Total Protein 6.0 (L) 6.5 - 8.1 g/dL   Albumin 2.6 (L) 3.5 - 5.0 g/dL   AST 42 (H) 15 - 41 U/L   ALT 19 0 - 44 U/L   Alkaline Phosphatase 104 38 - 126 U/L   Total Bilirubin 0.9 0.3 - 1.2 mg/dL   GFR, Estimated >09 >60 mL/min   Anion gap 9 5 - 15  Glucose, capillary     Status: None   Collection Time: 03/07/23  3:33 PM  Result Value Ref Range   Glucose-Capillary 75 70 - 99 mg/dL  CBC     Status: Abnormal   Collection Time: 03/08/23  4:48 AM  Result Value Ref Range   WBC 19.3 (H) 4.0 - 10.5 K/uL   RBC 3.59 (L) 3.87 - 5.11 MIL/uL   Hemoglobin 10.2 (L) 12.0 - 15.0 g/dL   HCT 45.4 (L) 09.8 - 11.9 %   MCV 85.8 80.0 - 100.0 fL   MCH 28.4 26.0 - 34.0 pg   MCHC 33.1 30.0 - 36.0 g/dL   RDW 14.7 82.9 - 56.2 %   Platelets 252 150 - 400 K/uL   nRBC 0.0 0.0 - 0.2 %     Assessment/Plan: Susan Krause is a 32 y.o. [id number] s/p repeat LTCS at [redacted]w[redacted]d POD#1 complicated by: 1) Preeclampsia with severe features -declined Mag due to prior episode of hypotension with this medication -Lasix x 5 days -pt agreeable to Labetalol 100mg  daily -currently asymptomatic, lab work pending this am  2) Postop -pain controlled -encourage ambulation -voiding trial this am  Contraception: declined Feeding: breast  Dispo: Continue routine postop care and will continue to monitor BPs   LOS: 1 day   Myna Hidalgo, DO Faculty Attending, Center for [facility] 03/08/2023, 6:35 AM

## 2023-03-08 NOTE — Lactation Note (Signed)
This note was copied from a baby's chart. Lactation Consultation Note  Patient Name: Susan Krause Today's Date: 03/08/2023 Age:30 hours Reason for consult: Initial assessment;1st time breastfeeding;Early term 37-38.6wks  LC in to visit with P2 Mom of ET infant delivered by C/S.  Mom has been exclusively breastfeeding baby and denies any discomfort of difficulty latching.  Baby has fed 12 times since birth.  Baby is at a 3.7% weight loss with good output.  Breast pump set up in room.  Mom states she tried and nothing was expressed. LC demonstrated breast massage and hand expression, colostrum easily expressed.  Mom educated on pumping early on and when that would be needed.  FOB took baby from crib and LC assisted/assessed baby latch with little assistance in football hold.  Baby able to attain a deep latch, sucking with deep jaw extensions and swallows.  Lots of teaching on what to expect in the first days of breastfeeding.  Plan recommended- 1-Keep baby STS as much as possible 2- offer the breast with feeding cues 3- ask for help with latch prn, reviewed what a deep latch would look like and feel like 4- pump only if baby is supplemented outside of breastfeeding, recommended exclusive breastfeeding.  Maternal Data Has patient been taught Hand Expression?: Yes Does the patient have breastfeeding experience prior to this delivery?: Yes How long did the patient breastfeed?: pumped for 11 1/2 months  Feeding Mother's Current Feeding Choice: Breast Milk  LATCH Score Latch: Grasps breast easily, tongue down, lips flanged, rhythmical sucking.  Audible Swallowing: Spontaneous and intermittent  Type of Nipple: Everted at rest and after stimulation  Comfort (Breast/Nipple): Soft / non-tender  Hold (Positioning): Assistance needed to correctly position infant at breast and maintain latch.  LATCH Score: 9   Lactation Tools Discussed/Used    Interventions Interventions: Breast  feeding basics reviewed;Assisted with latch;Breast massage;Skin to skin;Hand express;Education;LC Services brochure  Discharge Pump:  (has an old Spectra from 2019, [place] will submit a STORK pump request)  Consult Status Consult Status: Follow-up Date: 03/09/23 Follow-up type: In-patient    Susan Krause 03/08/2023, 9:56 AM
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
## 2023-03-09 ENCOUNTER — Ambulatory Visit: Payer: BC Managed Care – PPO

## 2023-03-09 NOTE — Lactation Note (Signed)
This note was copied from a baby's chart. Lactation Consultation Note  Patient Name: Susan Krause [id number] Date: 03/09/2023 Age:30 hours Reason for consult:  (LC notified the RN Tonita Phoenix a Stork pump referral was sent this afternoon at 1558. LC recommended tonight at 7pm provide report to the next shift potential engorgement , milk in.)   Maternal Data    Feeding Mother's Current Feeding Choice: Breast Milk   Discharge Pump: DEBP;Manual  Consult Status Consult Status: Follow-up Date: 03/10/23 Follow-up type: In-patient    Susan Krause 03/09/2023, 4:06 PM

## 2023-03-09 NOTE — Lactation Note (Signed)
This note was copied from a baby's chart. Lactation Consultation Note  Patient Name: Susan Krause [id number] Date: 03/09/2023 Age:30 hours Reason for consult: Follow-up assessment;1st time breastfeeding;Early term 37-38.6wks;Infant weight loss;Breastfeeding assistance (5 % weight loss, milk is in pumped off > 20 ml) Mom latched the baby by herself after the nurses assessment.  Baby obtained depth, per mom comfortable, and multiple swallows noted. Baby still feeding. Per mom had post pumped this am with results.  LC reviewed engorgement prevention and tx if needed.  Per mom requested to check on the pump the LC was to order.  LC mentioned to mom she would F/U with Stork pump program.   Maternal Data    Feeding Mother's Current Feeding Choice: Breast Milk  LATCH Score Latch: Grasps breast easily, tongue down, lips flanged, rhythmical sucking.  Audible Swallowing: Spontaneous and intermittent  Type of Nipple: Everted at rest and after stimulation  Comfort (Breast/Nipple): Filling, red/small blisters or bruises, mild/mod discomfort  Hold (Positioning): Assistance needed to correctly position infant at breast and maintain latch.  LATCH Score: 8   Lactation Tools Discussed/Used  DEBP , Hand pump demo for mom   Interventions Interventions: Breast feeding basics reviewed;Skin to skin;Breast compression;Support pillows;Hand pump;DEBP;Education  Discharge Pump: Manual  Consult Status Consult Status: Follow-up Date: 03/10/23 Follow-up type: In-patient    Susan Krause 03/09/2023, 8:35 AM

## 2023-03-09 NOTE — Progress Notes (Signed)
Post Partum Day 2 s/p rLTCS - SIPE w/ SF Subjective: up ad lib, voiding, tolerating PO, and + flatus. Denies HA, visual changes, CP/SOB, RUQ/epigastric pain Having some abdominal pain, but is avoiding opioid pain medication since it made her feel loopy before  Objective: Blood pressure 138/71, pulse 85, temperature 98.2 F (36.8 C), temperature source Oral, resp. rate 18, last menstrual period 06/14/2022, SpO2 99 %, unknown if currently breastfeeding.  Physical Exam:  General: alert, cooperative, and no distress Lochia: appropriate Uterine Fundus: firm Incision: pressure dressing in place, c/d/i DVT Evaluation: No evidence of DVT seen on physical exam.  Recent Labs    03/07/23 1000 03/08/23 0448  HGB 11.9* 10.2*  HCT 35.9* 30.8*    Assessment/Plan: Postpartum - Contraception: declines - MOF: breast - Rh status: Rh+ - Rubella status: RI - Dispo: continue postpartum care. Anticipate dc home POD3  Neonatal - Female infant, doing well at bedside  3. SIPE w/ SF (BP) - asymptomatic - BP controlled on labetalol 100mg  daily (rx'd for migraine ppx), 1 mild range BP - lasix x 5dPP - serum labs WNL, no longer trending - declined PP mag d/t hypotension with prior administration this asmission   LOS: 2 days   Lennart Pall 03/09/2023, 7:45 AM

## 2023-03-10 ENCOUNTER — Inpatient Hospital Stay (HOSPITAL_COMMUNITY): Admission: RE | Admit: 2023-03-10 | Payer: BC Managed Care – PPO | Source: Home / Self Care | Admitting: Family Medicine

## 2023-03-10 ENCOUNTER — Other Ambulatory Visit (HOSPITAL_COMMUNITY): Payer: Self-pay

## 2023-03-10 ENCOUNTER — Encounter: Payer: BC Managed Care – PPO | Admitting: Obstetrics and Gynecology

## 2023-03-10 MED ORDER — SENNOSIDES-DOCUSATE SODIUM 8.6-50 MG PO TABS
2.0000 | ORAL_TABLET | Freq: Every evening | ORAL | 0 refills | Status: DC | PRN
  Filled 2023-03-10: qty 30, 15d supply, fill #0

## 2023-03-10 MED ORDER — SIMETHICONE 80 MG PO CHEW
80.0000 mg | CHEWABLE_TABLET | ORAL | 0 refills | Status: DC | PRN
  Filled 2023-03-10: qty 30, 30d supply, fill #0

## 2023-03-10 MED ORDER — FUROSEMIDE 20 MG PO TABS
20.0000 mg | ORAL_TABLET | Freq: Every day | ORAL | 0 refills | Status: DC
  Filled 2023-03-10: qty 5, 5d supply, fill #0

## 2023-03-10 MED ORDER — ACETAMINOPHEN 500 MG PO TABS
1000.0000 mg | ORAL_TABLET | Freq: Four times a day (QID) | ORAL | 0 refills | Status: DC | PRN
  Filled 2023-03-10: qty 100, 13d supply, fill #0

## 2023-03-10 MED ORDER — IBUPROFEN 600 MG PO TABS
600.0000 mg | ORAL_TABLET | Freq: Four times a day (QID) | ORAL | 0 refills | Status: DC
  Filled 2023-03-10: qty 60, 15d supply, fill #0

## 2023-03-10 NOTE — Lactation Note (Signed)
This note was copied from a baby's chart. Lactation Consultation Note  Patient Name: Susan Krause [id number] Date: 03/10/2023 Age:30 hours Reason for consult: Follow-up assessment  Infant nursing well, mother pumping due to engorgement and hx overproduction. Provided milk storage guidelines and tips for preventing massive oversupply again. Hx mastitis x3 with first, reviewed preventative measures.   Maternal Data    Feeding Mother's Current Feeding Choice: Breast Milk    Lactation Tools Discussed/Used Tools: Pump Breast pump type: Double-Electric Breast Pump Pump Education: Milk Storage Reason for Pumping: engorgement Pumping frequency: prn Pumped volume: 60 mL  Interventions    Discharge Discharge Education: Engorgement and breast care;Warning signs for feeding baby Pump: DEBP  Consult Status Consult Status: Complete Date: 03/10/23 Follow-up type: Call as needed    Claiborne Billings 03/10/2023, 11:51 AM

## 2023-03-10 NOTE — Progress Notes (Signed)
Patient given discharge instructions and denies any questions or concerns. Patient off unit at this time.  

## 2023-03-15 ENCOUNTER — Encounter: Payer: Self-pay | Admitting: *Deleted

## 2023-03-15 ENCOUNTER — Ambulatory Visit (INDEPENDENT_AMBULATORY_CARE_PROVIDER_SITE_OTHER): Payer: BC Managed Care – PPO | Admitting: *Deleted

## 2023-03-15 VITALS — BP 128/84 | HR 94 | Wt 214.0 lb

## 2023-03-15 DIAGNOSIS — Z98891 History of uterine scar from previous surgery: Secondary | ICD-10-CM

## 2023-03-15 NOTE — Progress Notes (Signed)
Dry and intact.  Pt does c/o of sensitivity on left side of incision.Marland Kitchen  Honeycomb dressing had been removed by pt.  Sticky adhesive removed in office.  [place] score of 4 today.  Pt has no further issues or c/o's.  Pt to return 04/18/23 for routine post partum or PRN.

## 2023-03-15 NOTE — Progress Notes (Signed)
Pt her for a 1 week incision check after c-section.  Inci

## 2023-03-20 ENCOUNTER — Encounter (HOSPITAL_COMMUNITY): Payer: Self-pay | Admitting: Obstetrics and Gynecology

## 2023-03-20 ENCOUNTER — Inpatient Hospital Stay (HOSPITAL_COMMUNITY)
Admission: AD | Admit: 2023-03-20 | Discharge: 2023-03-20 | Disposition: A | Discharge status: Home / Self Care | Payer: BC Managed Care – PPO | Attending: Obstetrics and Gynecology | Admitting: Obstetrics and Gynecology

## 2023-03-20 DIAGNOSIS — O9089 Other complications of the puerperium, not elsewhere classified: Secondary | ICD-10-CM | POA: Insufficient documentation

## 2023-03-20 DIAGNOSIS — Z98891 History of uterine scar from previous surgery: Secondary | ICD-10-CM

## 2023-03-20 DIAGNOSIS — R109 Unspecified abdominal pain: Secondary | ICD-10-CM | POA: Insufficient documentation

## 2023-03-20 MED ORDER — IBUPROFEN 600 MG PO TABS
600.0000 mg | ORAL_TABLET | Freq: Once | ORAL | Status: AC
  Administered 2023-03-20: 600 mg via ORAL
  Filled 2023-03-20: qty 1

## 2023-03-20 MED ORDER — LABETALOL HCL 100 MG PO TABS
100.0000 mg | ORAL_TABLET | Freq: Once | ORAL | Status: AC
  Administered 2023-03-20: 100 mg via ORAL
  Filled 2023-03-20: qty 1

## 2023-03-20 NOTE — Progress Notes (Signed)
History    Chief Complaint  Patient presents with   Drainage from Incision   31yo 02[birth year]41 who is 2 weeks s/p rLTCS presenting for vaginal bleeding and incisional pain.   Reports 1 day of vaginal bleeding. Had completely stopped bleeding by 6/29. Today passed a golf ball sized clot and several smaller clots. Reports bleeding is around the heaviness of a period, but is exclusively breastfeeding so wasn't expecting to have started her period at this point.   Also reports persistent pain superior and lateral to her incision. Taking tylenol/ibuprofen prn with some relief but doesn't remember having this persistent of pain at this point with her last c section.   Denies nausea,vomiting, fevers, chills. No drainage from her incision.   Past Medical History:  Diagnosis Date   ADHD (attention deficit hyperactivity disorder)    No meds   Cervical dysplasia    Chlamydia    Complication of anesthesia    woke up during wisdom teeth and had PVCs   Head injury with loss of consciousness (HCC) 03/2020   Hypertension    Hypoglycemia    Migraine    Propanolol 10 mg   OCD (obsessive compulsive disorder)    Ovarian cyst    PTSD (post-traumatic stress disorder)    no meds   PVC's (premature ventricular contractions) 2018   has palpations when she has PV's.   Sinus tachycardia    UTI (urinary tract infection)     Past Surgical History:  Procedure Laterality Date   CESAREAN SECTION N/A 07/18/2018   Procedure: CESAREAN SECTION;  Surgeon: Essie Hart, MD;  Location: [facility] BIRTHING SUITES;  Service: Obstetrics;  Laterality: N/A;   CESAREAN SECTION N/A 03/07/2023   Procedure: CESAREAN SECTION;  Surgeon: Venora Maples, MD;  Location: [facility] LD ORS;  Service: Obstetrics;  Laterality: N/A;   DIAGNOSTIC LAPAROSCOPY WITH REMOVAL OF ECTOPIC PREGNANCY Left 12/20/2020   Procedure: LAPAROSCOPIC LEFT SALPINGECTOMY WITH REMOVAL OF ECTOPIC PREGNANCY;  Surgeon: [name], MD;  Location: [facility] OR;  Service:  Gynecology;  Laterality: Left;   DILATION AND CURETTAGE OF UTERUS N/A 07/01/2021   Procedure: DILATATION AND CURETTAGE WITH PATHOLOGY;  Surgeon: Milas Hock, MD;  Location: [facility] OR;  Service: Gynecology;  Laterality: N/A;   SALPINGECTOMY Left 12/2020   WISDOM TOOTH EXTRACTION      Family History  Problem Relation Age of Onset   Diabetes Mother    Cancer Mother        stage 2 breast   ADD / ADHD Mother    Anxiety disorder Mother    Depression Mother    Hyperlipidemia Mother    Obesity Mother    Depression Father    ADD / ADHD Father    Anxiety disorder Sister    Asthma Sister    Depression Sister    ADD / ADHD Sister    Intellectual disability Brother    Learning disabilities Brother    Anxiety disorder Maternal Aunt    Diabetes Maternal Aunt    Alcohol abuse Maternal Uncle    Hyperlipidemia Maternal Uncle    Anxiety disorder Paternal Uncle    Diabetes Paternal Uncle    Anxiety disorder Maternal Grandmother    Diabetes Maternal Grandmother    Hyperlipidemia Maternal Grandmother    Varicose Veins Maternal Grandmother    Cancer Maternal Grandfather    Hyperlipidemia Maternal Grandfather    Obesity Maternal Grandfather    Vision loss Maternal Grandfather    Cancer Paternal Grandmother  Social History   Tobacco Use   Smoking status: Never   Smokeless tobacco: Never  Vaping Use   Vaping Use: Never used  Substance Use Topics   Alcohol use: No   Drug use: No    Allergies:  Allergies  Allergen Reactions   Banana Swelling   Latex Swelling   Peanut-Containing Drug Products Anaphylaxis   Hpv Bival (Type 16,18) Recomb Vaccine  [Human Papillomavirus (16,18) Recomb Vac] Hives   Iodinated Contrast Media Diarrhea, Palpitations and Other (See Comments)   Pumpkin Flavor Swelling   Lactose Intolerance (Gi) Other (See Comments)    Gi upset    Medications Prior to Admission  Medication Sig Dispense Refill Last Dose   acetaminophen (TYLENOL) 500 MG tablet Take 2  tablets (1,000 mg total) by mouth every 6 (six) hours as needed. 100 tablet 0 03/19/2023   ibuprofen (ADVIL) 600 MG tablet Take 1 tablet (600 mg total) by mouth every 6 (six) hours. 60 tablet 0 03/19/2023   labetalol (NORMODYNE) 100 MG tablet Take 100 mg by mouth daily.   Past Week   cetirizine (ZYRTEC) 10 MG chewable tablet Chew 10 mg by mouth daily.      famotidine (PEPCID) 20 MG tablet Take 20 mg by mouth 2 (two) times daily.      Prenatal Multivit-Min-Fe-FA (PRENATAL 1 + IRON PO)       senna-docusate (SENOKOT-S) 8.6-50 MG tablet Take 2 tablets by mouth at bedtime as needed for mild constipation. 30 tablet 0    simethicone (MYLICON) 80 MG chewable tablet Chew 1 tablet (80 mg total) by mouth as needed for flatulence. 30 tablet 0     Review of Systems - Per HPI Physical Exam Blood pressure (!) 137/96, pulse 82, temperature 98.1 F (36.7 C), temperature source Oral, resp. rate 18, height 5\' 2"  (1.575 m), weight 96.4 kg, last menstrual period 06/14/2022, SpO2 99 %, currently breastfeeding. Physical Exam Constitutional:      General: She is not in acute distress. Cardiovascular:     Rate and Rhythm: Normal rate.  Pulmonary:     Effort: Pulmonary effort is normal. No respiratory distress.  Abdominal:     General: There is no distension.     Palpations: Abdomen is soft.     Tenderness: There is abdominal tenderness. There is no guarding or rebound.     Comments: Mild superficial tenderness superior to her incision. No fundal tenderness.  Incision is well healed with no erythema, warmth or tenderness. Some mild induration/firmness along superior aspect of incision but appropriate for 2 weeks post op  Neurological:     Mental Status: She is alert.    Assessment/Plan: Exam reassuring today. No e/o PPH, endometritis, surgical site infection. Given reassuring exam, discussed option for pelvic US vs expectant management and outpatient follow up. She is reassured and feels comfortable with  outpatient follow up prn.  Reviewed return precautions for HVB, fevers, nausea/vomiting or severe uncontrolled pain.  Harvie Bridge, MD Obstetrician & Gynecologist, [facility] for [employer], [facility]

## 2023-03-20 NOTE — MAU Note (Signed)
.  Susan Krause is a 32 y.o. at [redacted]w[redacted]d here in MAU reporting: sharp pain on her right sided of her incision, she can't pull her stomach up without pain and tender above her incision. Pt reports an odor at her incision. Pt reports some clots of lochia today, after having stopped bleeding. Pt states she had her incision on Monday. Pt taking to take her Motrin and Tylenol as scheduled as she can.  CHTN on Labetalol, last dose 03/18/23 Onset of complaint: ongoing  Pain score: 3/10 Vitals:   03/20/23 0048  BP: (!) 135/97  Pulse: 80  Resp: 18  Temp: 98.1 F (36.7 C)  SpO2: 99%      Lab orders placed from triage:

## 2023-03-23 ENCOUNTER — Encounter: Payer: Self-pay | Admitting: Obstetrics and Gynecology

## 2023-03-24 ENCOUNTER — Ambulatory Visit (INDEPENDENT_AMBULATORY_CARE_PROVIDER_SITE_OTHER): Payer: BC Managed Care – PPO | Admitting: Obstetrics and Gynecology

## 2023-03-24 ENCOUNTER — Encounter: Payer: Self-pay | Admitting: Obstetrics and Gynecology

## 2023-03-24 ENCOUNTER — Ambulatory Visit (INDEPENDENT_AMBULATORY_CARE_PROVIDER_SITE_OTHER): Payer: BC Managed Care – PPO

## 2023-03-24 ENCOUNTER — Other Ambulatory Visit: Payer: Self-pay

## 2023-03-24 VITALS — BP 138/97 | HR 75 | Temp 98.6°F | Ht 62.0 in | Wt 209.0 lb

## 2023-03-24 DIAGNOSIS — N939 Abnormal uterine and vaginal bleeding, unspecified: Secondary | ICD-10-CM

## 2023-03-24 DIAGNOSIS — O8601 Infection of obstetric surgical wound, superficial incisional site: Secondary | ICD-10-CM | POA: Diagnosis not present

## 2023-03-24 DIAGNOSIS — T8149XA Infection following a procedure, other surgical site, initial encounter: Secondary | ICD-10-CM

## 2023-03-24 MED ORDER — SULFAMETHOXAZOLE-TRIMETHOPRIM 800-160 MG PO TABS: 1.0000 | ORAL_TABLET | Freq: Two times a day (BID) | ORAL | 1 refills | Status: DC

## 2023-03-24 NOTE — Progress Notes (Signed)
   POSTPARTUM PROBLEM VISIT  Subjective:  Susan Krause is a 32 y.o. [id number] who is POD17 from rLTCS presenting as a problem visit for vaginal bleeding & incision check  Seen in MAU by myself on 7/7. Had an episode of vaginal bleeding & passing clots unexpectedly after stopping bleeding after delivery. She also had concerns about her incision. Bleeding had essentially stopped by the time of my evaluation and her incision was well healed w/ no area of erythema, warmth, tenderness, fluctuance or drainage. We planned for expectant management & pt would call if symptoms persisted.  Bleeding has slowed but is persistent. She also now sees any area on the left side of her incision that is red, tender and has malodorous drainage even after showering. No f/c.   Objective:   Vitals:   03/24/23 0921 03/24/23 0932  BP: (!) 157/122 (!) 138/97  Pulse: 77 75  Temp: 98.6 F (37 C)   Weight: 209 lb (94.8 kg)   Height: 5\' 2"  (1.575 m)    General:  Alert, oriented and cooperative. Patient is in no acute distress.  Skin: Skin is warm and dry. No rash noted.   Cardiovascular: Normal heart rate noted  Respiratory: Normal respiratory effort, no problems with respiration noted  Abdomen: Soft, non-tender, non-distended. Fundus non palpable, nontender.  Left aspect of incision with mild erythema, warmth & tenderness consistent with post op cellulitis. No drainage was able to be expressed. Incision intact without gaps. No fluctuance or e/o underlying abscess/fluid collection.    Assessment and Plan:  Adabella Stanis is a 32 y.o. with post op incisional cellulitis and abnormal lochia after CS  Cellulitis, wound, post-operative No imaging or wound exploration indicated at this time. -     sulfamethoxazole-trimethoprim (BACTRIM DS) 800-160 MG tablet; Take 1 tablet by mouth 2 (two) times daily.  Vaginal bleeding Pelvic US scheduled for 1pm  cHTN Recheck acceptable. Continue current medications. If elevated next appt,  will discuss titrating meds further  Reviewed reasons to present to MAU over the weekend - HVB, fevers, inability to tolerate PO  Return in about 1 week (around 03/31/2023) for incision check .  Future Appointments  Date Time Provider Department Center  03/24/2023  1:00 PM [facility] [place] [facility]  03/31/2023  9:30 AM Lennart Pall, MD [facility]  04/19/2023 10:50 AM Rasch, Harolyn Rutherford, NP [facility]    Lennart Pall, MD

## 2023-03-24 NOTE — Patient Instructions (Signed)
Please let us know if you have fevers or start vomiting. If you have fevers or can't keep food down over the weekend, please go to the MAU at the [facility].

## 2023-03-25 ENCOUNTER — Ambulatory Visit
Admission: EM | Admit: 2023-03-25 | Discharge: 2023-03-25 | Disposition: A | Discharge status: Home / Self Care | Payer: BC Managed Care – PPO

## 2023-03-25 DIAGNOSIS — W57XXXA Bitten or stung by nonvenomous insect and other nonvenomous arthropods, initial encounter: Secondary | ICD-10-CM | POA: Diagnosis not present

## 2023-03-25 DIAGNOSIS — L03116 Cellulitis of left lower limb: Secondary | ICD-10-CM | POA: Diagnosis not present

## 2023-03-25 NOTE — ED Triage Notes (Signed)
Pt presents to uc with co of insect bite on her left calf  2 days ago that now has a red bullseye rash appear on it this morning.
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
## 2023-03-25 NOTE — Discharge Instructions (Addendum)
Advised patient to to continue to take previously prescribed Bactrim to completion.  Encouraged increase daily water intake to 64 ounces per day while taking this medication.  Advised if symptoms worsen and/or unresolved please follow-up with or here for further evaluation.

## 2023-03-31 ENCOUNTER — Encounter: Payer: Self-pay | Admitting: Obstetrics and Gynecology

## 2023-03-31 ENCOUNTER — Ambulatory Visit (INDEPENDENT_AMBULATORY_CARE_PROVIDER_SITE_OTHER): Payer: BC Managed Care – PPO | Admitting: Obstetrics and Gynecology

## 2023-03-31 DIAGNOSIS — T8149XA Infection following a procedure, other surgical site, initial encounter: Secondary | ICD-10-CM

## 2023-03-31 DIAGNOSIS — O8601 Infection of obstetric surgical wound, superficial incisional site: Secondary | ICD-10-CM | POA: Diagnosis not present

## 2023-03-31 NOTE — Progress Notes (Signed)
   POSTPARTUM PROBLEM VISIT  Subjective:  Susan Krause is a 32 y.o. [phone] who is POD24 from rLTCS presenting as a problem visit for vaginal bleeding & incision check  7/7: MAU for vaginal bleeding & passing clots unexpectedly after stopping bleeding after delivery. She also had concerns about her incision. Bleeding had essentially stopped by the time of my evaluation and her incision was well healed w/ no area of erythema, warmth, tenderness, fluctuance or drainage. We planned for expectant management & pt would call if symptoms persisted.  7/11: Persistent light bleeding and new area on L side of incision c/w cellulitis. Pelvic US obtained w/ El76mm mildly heterogenous, possible rPOCs. Rx for bactrim x 7d  Today - feeling better. Incision looks better & no longer having drainage or foul smell. No longer bleeding  Objective:   Vitals:   03/31/23 1308  BP: 116/81  Pulse: 91  Weight: 208 lb (94.3 kg)  Height: 5\' 2"  (1.575 m)   General:  Alert, oriented and cooperative. Patient is in no acute distress.  Skin: Skin is warm and dry. No rash noted.   Cardiovascular: Normal heart rate noted  Respiratory: Normal respiratory effort, no problems with respiration noted  Abdomen: Soft, non-tender, non-distended.  Resolution of incisional cellulitis. No further erythema, warmth or drainage. Healing well   Assessment and Plan:  Susan Krause is a 32 y.o. presenting for follow up of incisional cellulitis and abnormal lochia after CS - symptoms now resolved  Cellulitis, wound, post-operative Healing well Will finish abx today Follow up prn   Vaginal bleeding Symptoms now resolved. Likelihood of rPOCs after CS very low and now that bleeding has stopped, no indication for intervention  cHTN Normotensive. Continue current meds.   Follow up for routine postpartum care  Future Appointments  Date Time Provider Department Center  04/19/2023 10:50 AM Rasch, Harolyn Rutherford, NP [facility]    Lennart Pall, MD

## 2023-04-05 ENCOUNTER — Encounter: Payer: Self-pay | Admitting: Obstetrics and Gynecology

## 2023-04-07 MED ORDER — SULFAMETHOXAZOLE-TRIMETHOPRIM 800-160 MG PO TABS: 1.0000 | ORAL_TABLET | Freq: Two times a day (BID) | ORAL | 0 refills | Status: DC

## 2023-04-19 ENCOUNTER — Ambulatory Visit (INDEPENDENT_AMBULATORY_CARE_PROVIDER_SITE_OTHER): Payer: BC Managed Care – PPO | Admitting: Obstetrics and Gynecology

## 2023-04-19 ENCOUNTER — Encounter: Payer: Self-pay | Admitting: Obstetrics and Gynecology

## 2023-04-19 NOTE — Progress Notes (Signed)
Post Partum Visit Note  Susan Krause is a 32 y.o. [phone] female who presents for a postpartum visit. She is 6 weeks postpartum following a repeat cesarean section.  I have fully reviewed the prenatal and intrapartum course. The delivery was at 38 gestational weeks.  Anesthesia: spinal. Postpartum course has been unremarkable. Baby is doing well. Baby is feeding by breast. Bleeding no bleeding. Bowel function is normal. Bladder function is normal. Patient is sexually active. Contraception method is none. Postpartum depression screening: negative.   The pregnancy intention screening data noted above was reviewed. Potential methods of contraception were discussed. The patient elected to proceed with No data recorded.   Edinburgh Postnatal Depression Scale - 04/19/23 1100       Edinburgh Postnatal Depression Scale:  In the Past 7 Days   I have been able to laugh and see the funny side of things. 0    I have looked forward with enjoyment to things. 0    I have blamed myself unnecessarily when things went wrong. 2    I have been anxious or worried for no good reason. 2    I have felt scared or panicky for no good reason. 0    Things have been getting on top of me. 1    I have been so unhappy that I have had difficulty sleeping. 0    I have felt sad or miserable. 0    I have been so unhappy that I have been crying. 0    The thought of harming myself has occurred to me. 0    Edinburgh Postnatal Depression Scale Total 5             Health Maintenance Due  Topic Date Due   COVID-19 Vaccine (1) Never done   INFLUENZA VACCINE  04/14/2023    The following portions of the patient's history were reviewed and updated as appropriate: allergies, current medications, past family history, past medical history, past social history, past surgical history, and problem list.  Review of Systems Pertinent items are noted in HPI.  Objective:  BP 108/73   Pulse 91   Ht 5\' 2"  (1.575 m)   Wt 200 lb  (90.7 kg)   LMP 06/14/2022   Breastfeeding Yes   BMI 36.58 kg/m         Assessment:   Normal postpartum exam Declined BC pills    Plan:   Essential components of care per ACOG recommendations:  1.  Mood and well being: Patient with negative depression screening today. Reviewed local resources for support.  - Patient tobacco use? No.   - hx of drug use? No.    2. Infant care and feeding:  -Patient currently breastmilk feeding? Yes. Discussed returning to work and pumping.  -Social determinants of health (SDOH) reviewed in EPIC. No concerns  3. Sexuality, contraception and birth spacing - Patient does want a pregnancy in the next year.  Desired family size is 3 children.  - Reviewed reproductive life planning. Reviewed contraceptive methods based on pt preferences and effectiveness.  Patient desired Unknown/Not Reported today.   - Discussed birth spacing of 18 months  4. Sleep and fatigue -Encouraged family/partner/community support of 4 hrs of uninterrupted sleep to help with mood and fatigue  5. Physical Recovery  - Discussed patients delivery and complications. She describes her labor as good. - Patient has urinary incontinence?  - Patient  safe to resume physical and sexual activity  6.  Health Maintenance -  HM due items addressed  - Last pap smear  Diagnosis  Date Value Ref Range Status  01/22/2021      - Negative for intraepithelial lesion or malignancy (NILM)   Pap smear  at today's visit.  -Breast Cancer screening indicated?   7. Chronic Disease/Pregnancy Condition follow up: Hypertension BP normal today.   - PCP follow up  2022 PAP normal   Venia Carbon, NP [facility] [employer], [facility]

## 2023-10-15 IMAGING — US US OB TRANSVAGINAL
1 series · 15 of 28 positions shown · non-contrast
Comparison: Ultrasound 07/06/2021, 07/03/2021, 06/29/2021,
06/23/2021

CLINICAL DATA: Pain with known right ectopic

EXAM:
TRANSVAGINAL OB ULTRASOUND
TECHNIQUE: Transvaginal ultrasound was performed for complete evaluation of the
gestation as well as the maternal uterus, adnexal regions, and
pelvic cul-de-sac.

[Series 1: us ob transvaginal · 37 acquisitions, 15 frames shown]
[im 1/37]
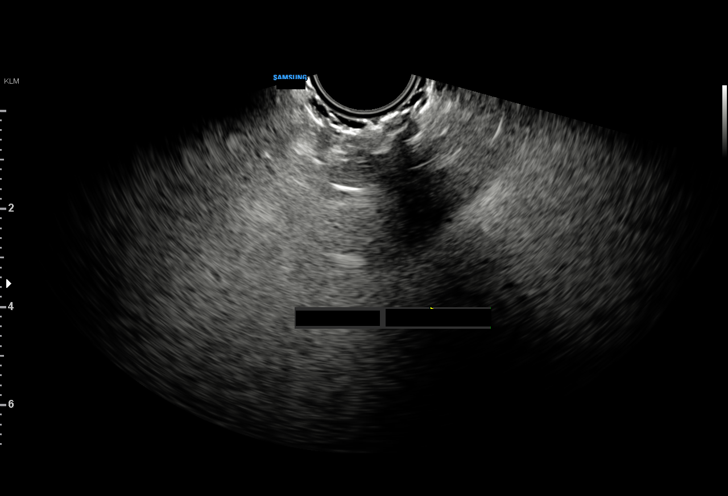
[im 3/37]
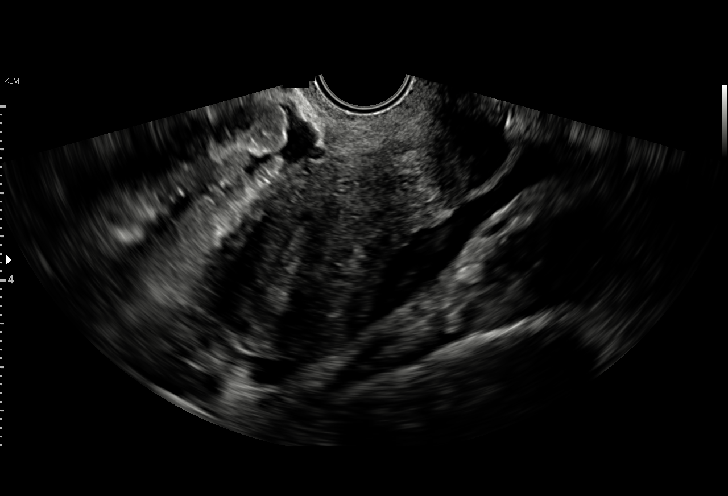
[im 6/37]
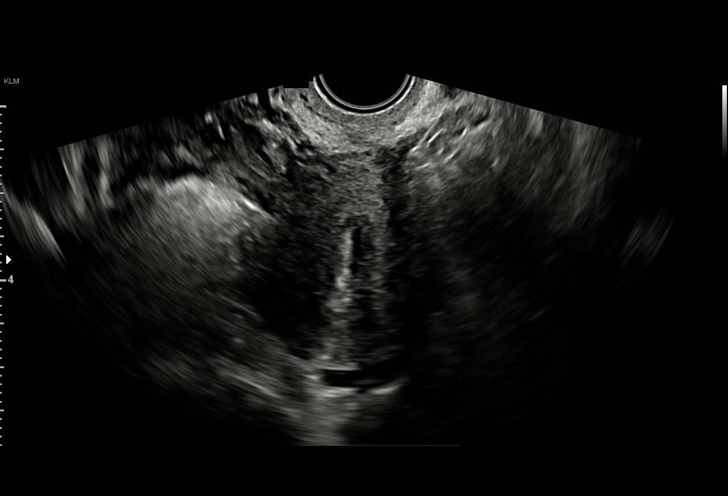
[im 9/37]
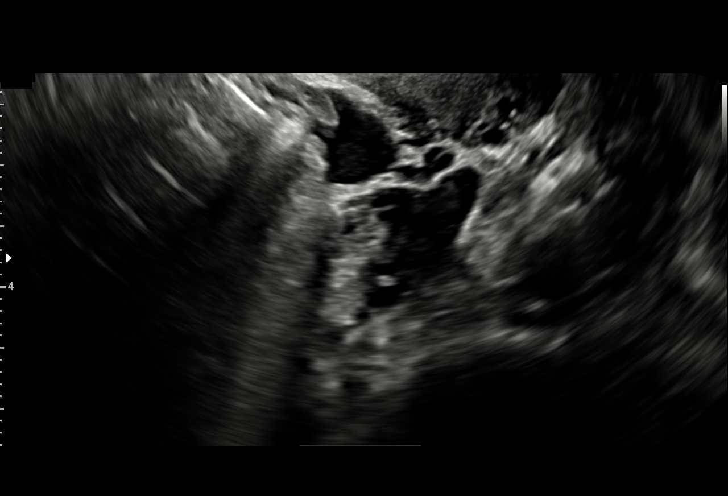
[im 11/37]
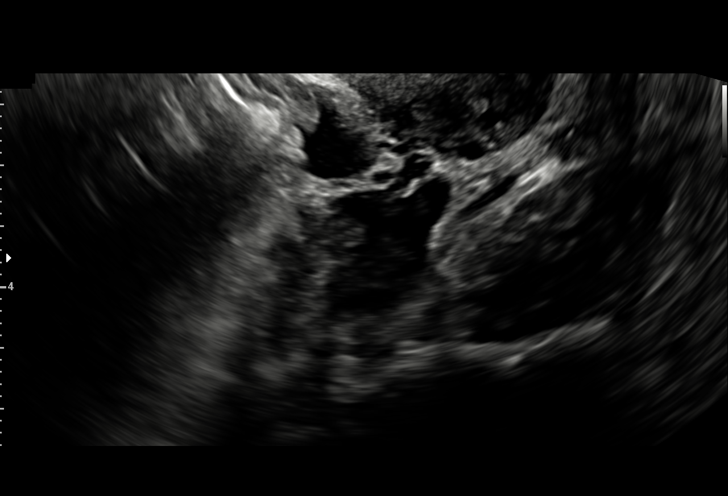
[im 14/37]
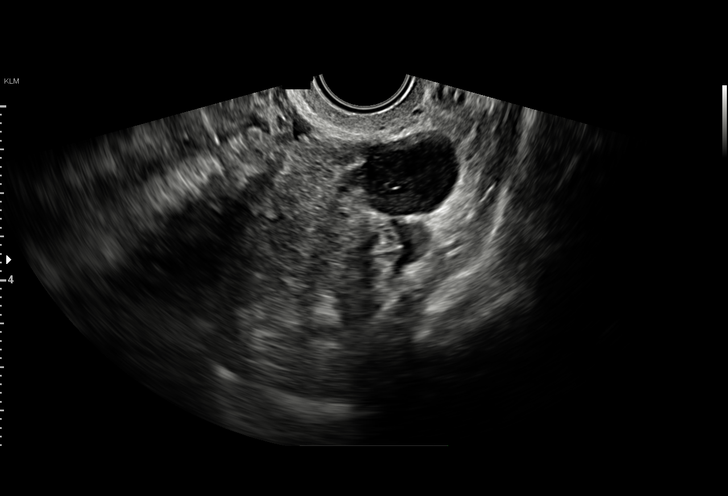
[im 17/37]
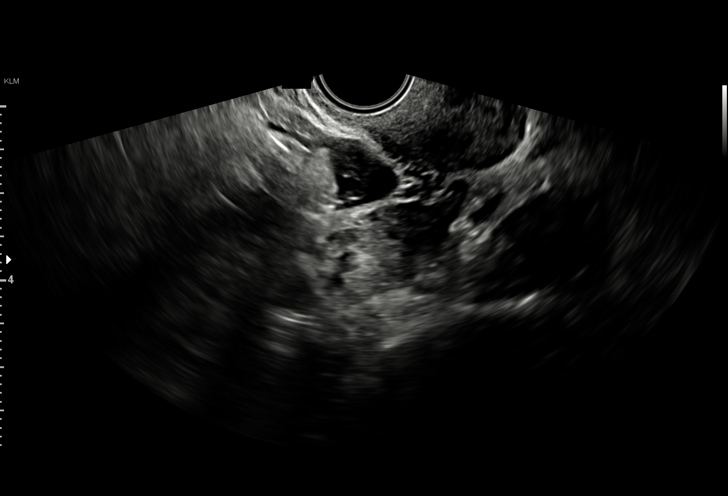
[im 19/37]
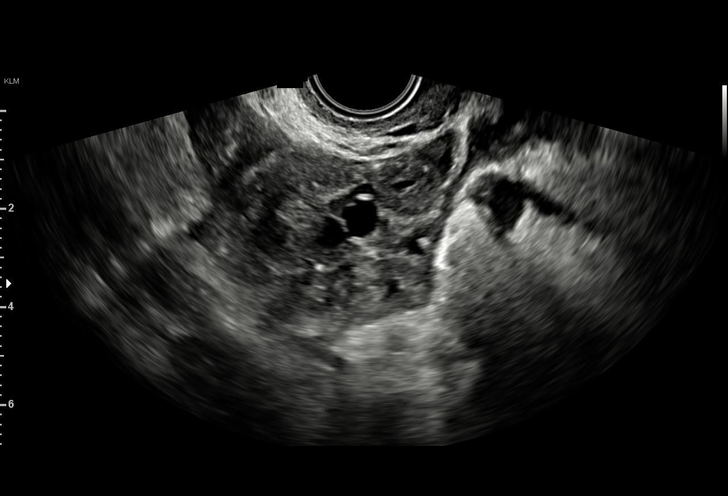
[im 21/37]
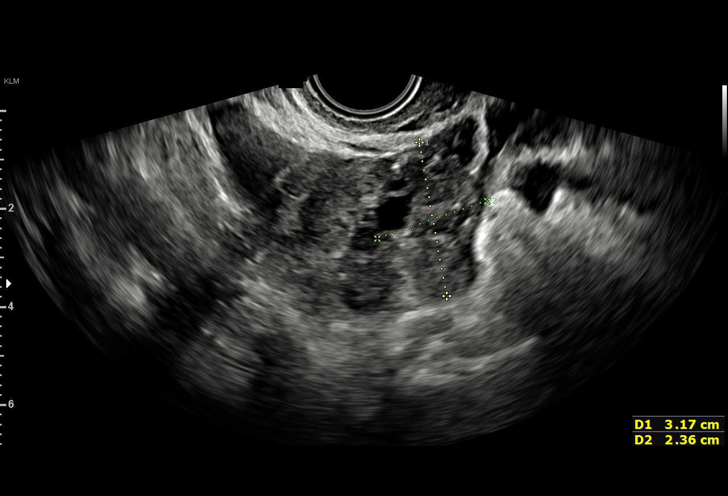
[im 23/37]
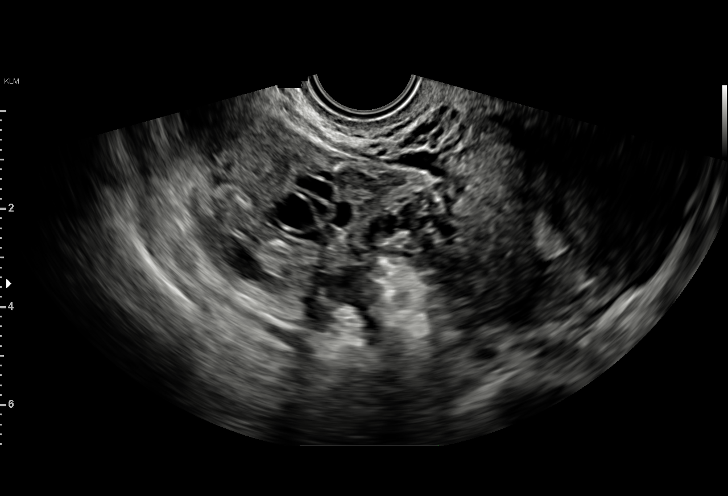
[im 26/37]
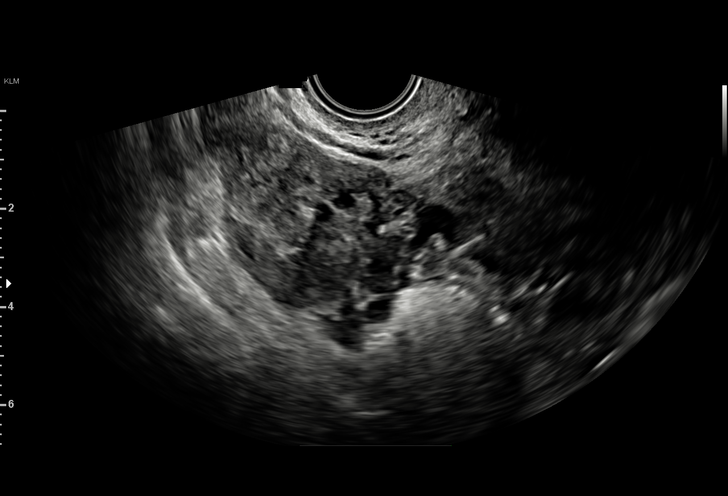
[im 29/37]
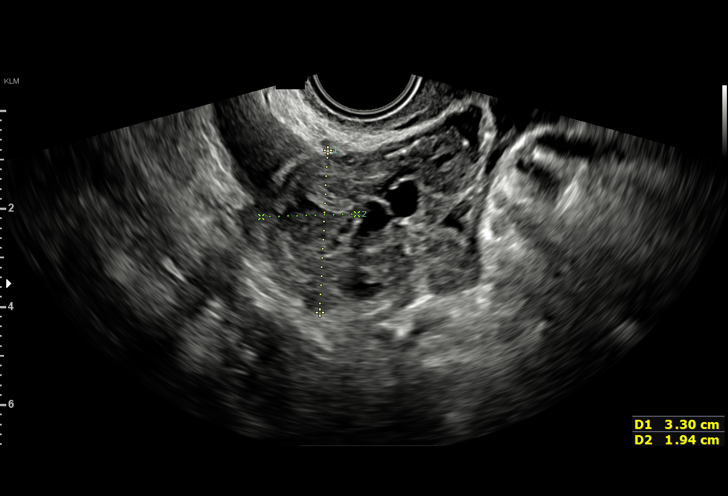
[im 31/37]
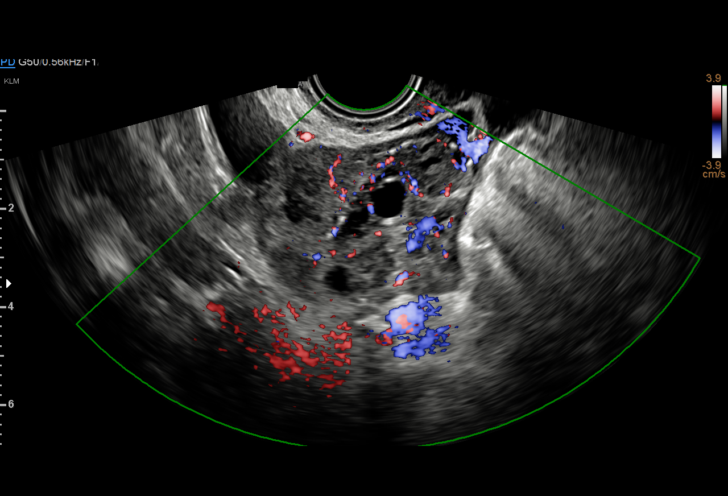
[im 34/37]
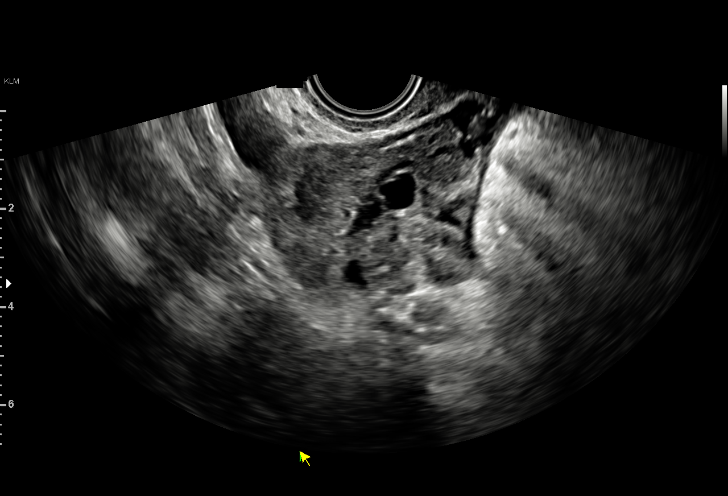
[im 37/37]
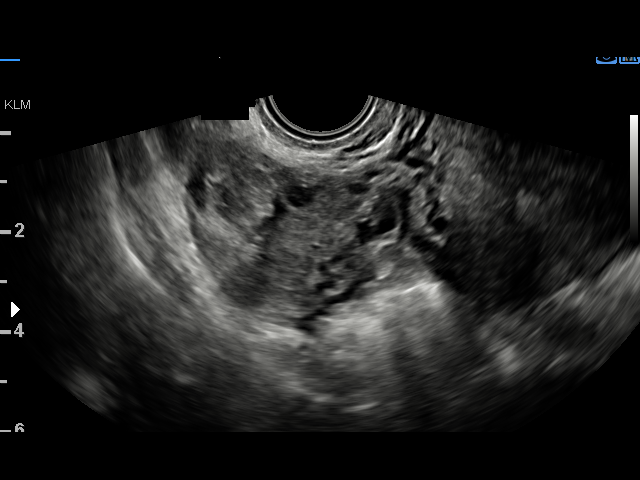

[15 of 28 positions shown; findings below may reference images not displayed]

FINDINGS: Intrauterine gestational sac: Not visualized

Yolk sac:  Not visualized

Embryo:  Not visualized

Maternal uterus/adnexae: Left ovary is normal and measures 1.4 x
x 1 cm. Right ovary measures 3.2 x 2.4 x 2.3 cm. Slightly echogenic
mass adjacent to the right ovary. Right adnexal mass measures 3.3 x
1.9 x 1.9 cm.

Previous measurements:

07/06/2021: 0.8 x 1.3 cm

07/03/2021: 1.4 x 2 x 2.2 cm

Other: Small free fluid in the pelvis, decreased compared to prior
ultrasound.
IMPRESSION: 1. Persistent abnormal soft tissue mass adjacent to the right ovary,
measuring up to 3.3 cm in size, overall increased compared to prior
measurements of 2.2 cm maximum on 07/03/2021.
2. Small free fluid in the pelvis which is decreased compared with
ultrasound on 07/06/2021.

## 2023-10-16 IMAGING — CT CT ABD-PELV W/O CM
2 of 4 series · 17 of 46 positions shown, 19 images · non-contrast
Comparison: None.

CLINICAL DATA: Right lower quadrant pain.

EXAM:
CT ABDOMEN AND PELVIS WITHOUT CONTRAST
TECHNIQUE: Multidetector CT imaging of the abdomen and pelvis was performed
following the standard protocol without IV contrast.

[Series 3: abd/ pelvis 5.0 i30f 2 · axial · 0.83mm/px · z∈[-465,-60]mm · 14 of 89 slices shown, 16 images]
[im 4/89  soft-tissue]
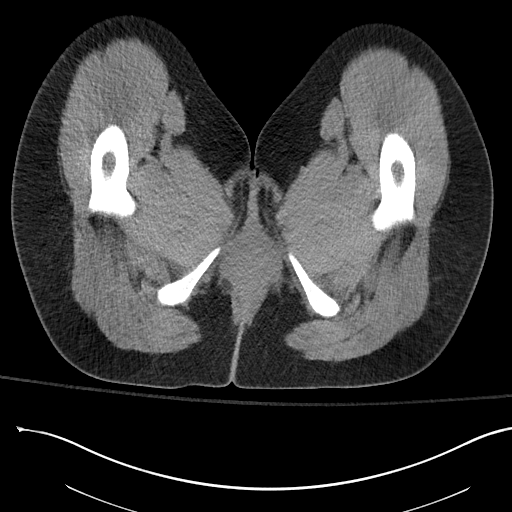
[im 4/89  bone]
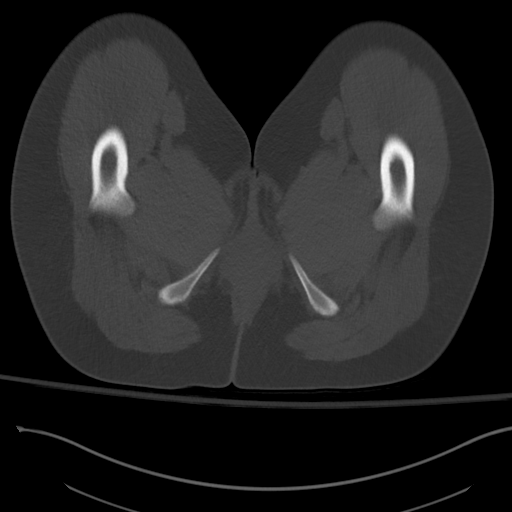
[im 11/89  soft-tissue]
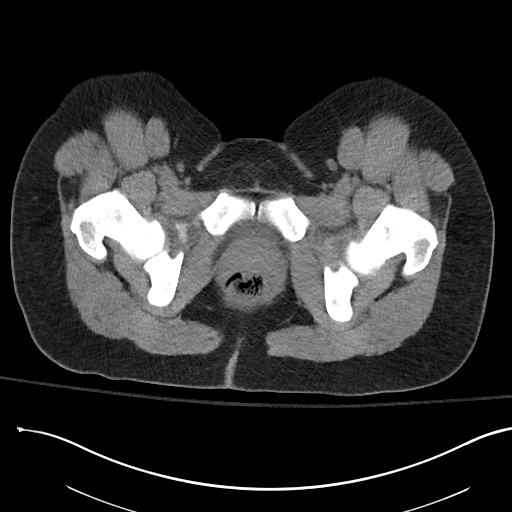
[im 18/89  soft-tissue]
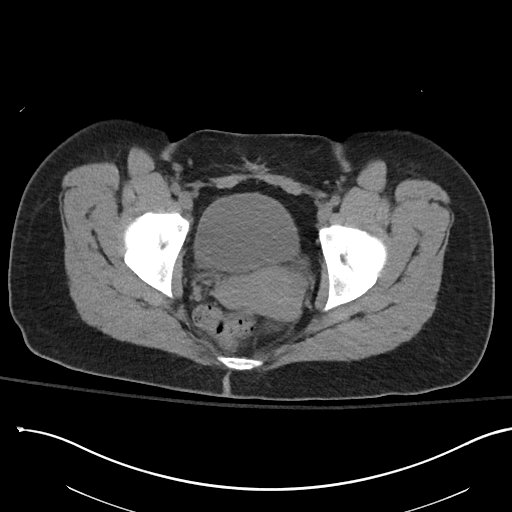
[im 25/89  soft-tissue]
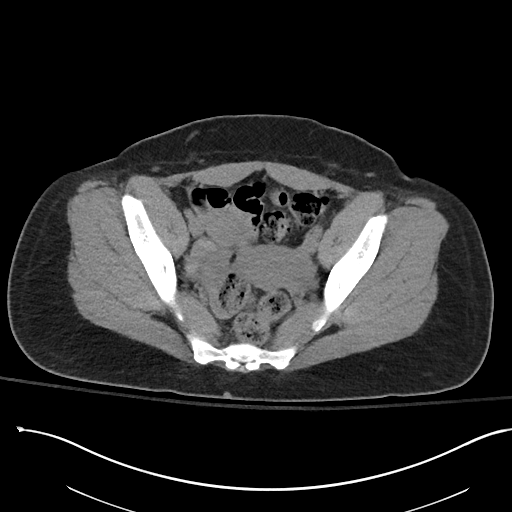
[im 29/89  soft-tissue]
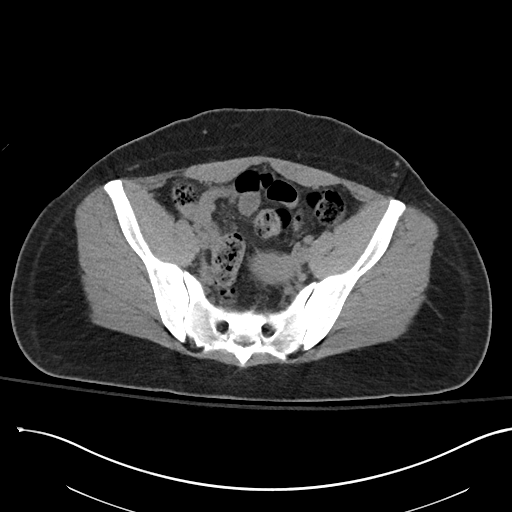
[im 36/89  soft-tissue]
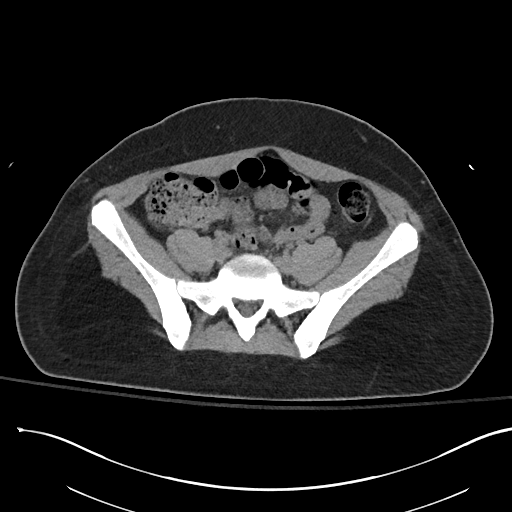
[im 43/89  soft-tissue]
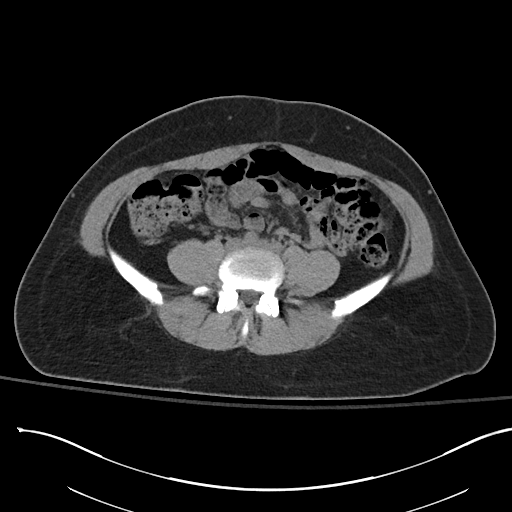
[im 46/89  soft-tissue]
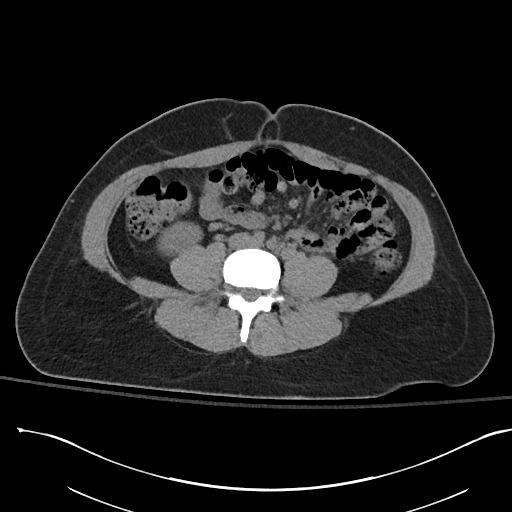
[im 53/89  soft-tissue]
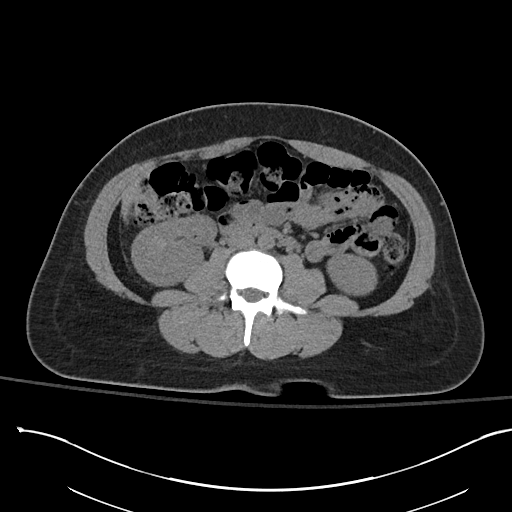
[im 53/89  bone]
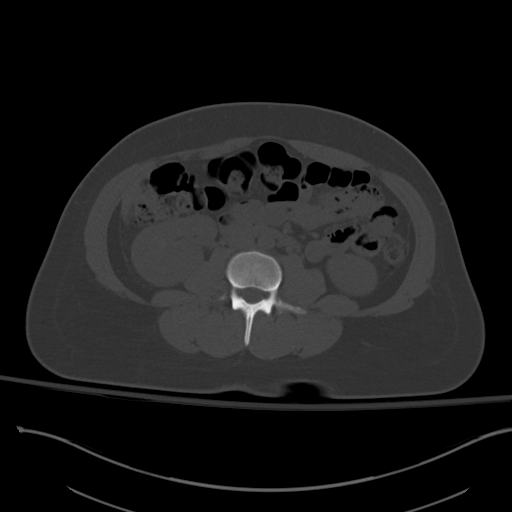
[im 60/89  soft-tissue]
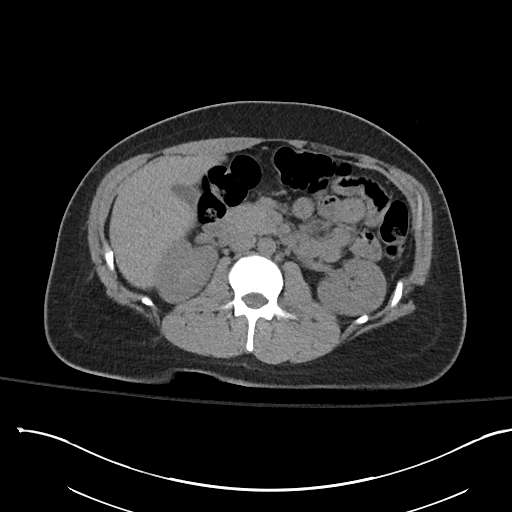
[im 67/89  soft-tissue]
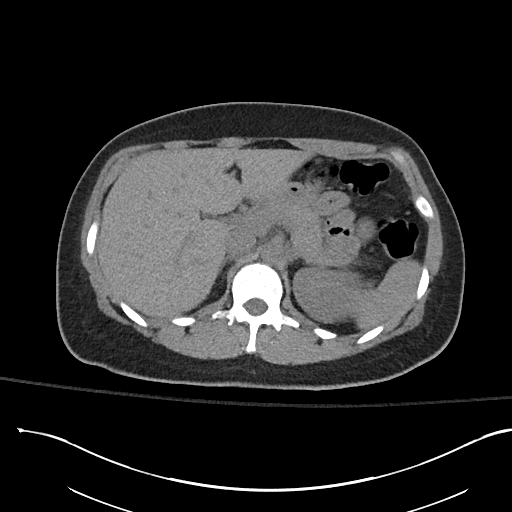
[im 71/89  soft-tissue]
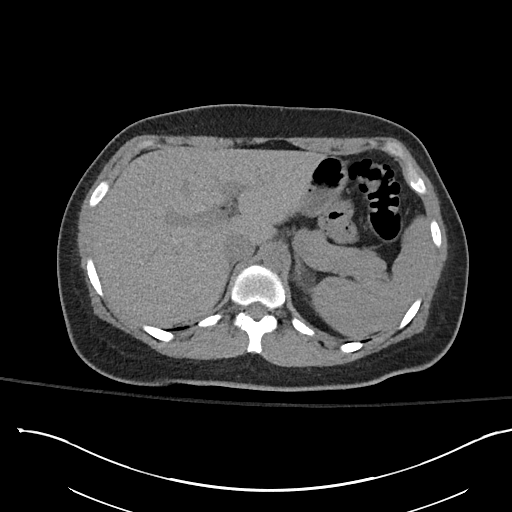
[im 78/89  soft-tissue]
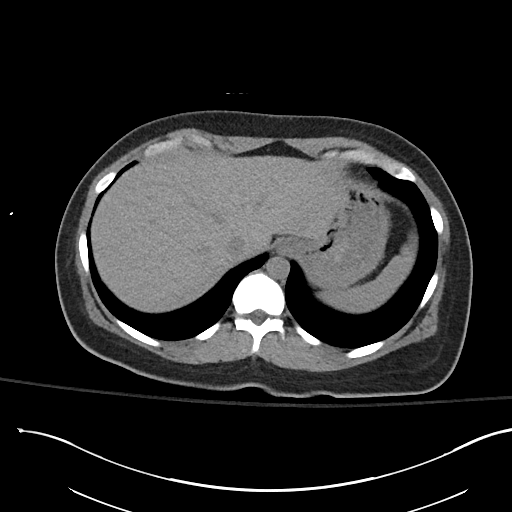
[im 85/89  soft-tissue]
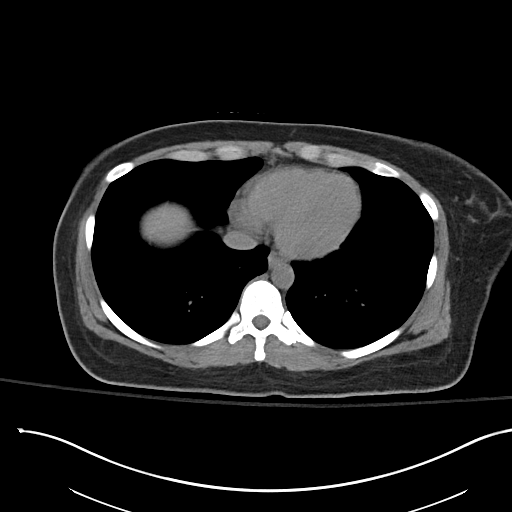

[Series 6: cor st · coronal · 0.85mm/px · 3 of 85 slices shown]
[im 29/85  soft-tissue]
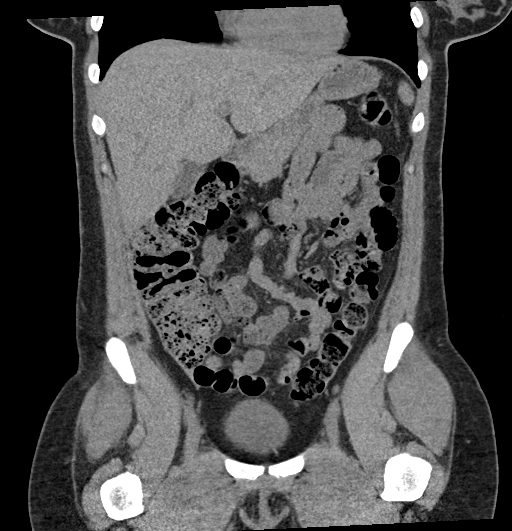
[im 38/85  soft-tissue]
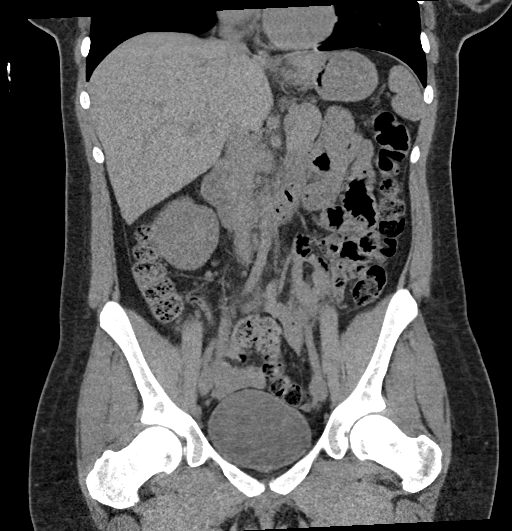
[im 47/85  soft-tissue]
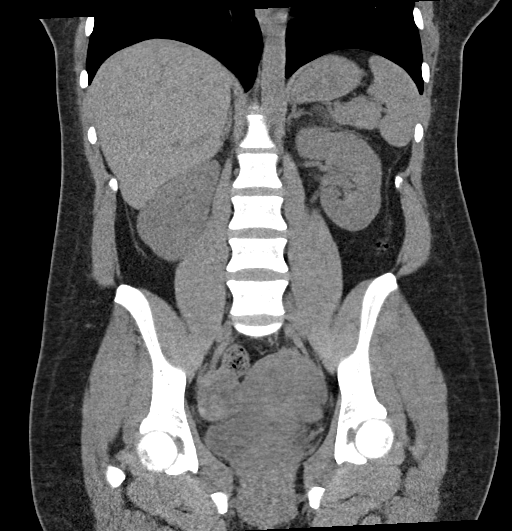

[17 of 46 positions shown; findings below may reference images not displayed]

FINDINGS: Lower chest: No acute abnormality.

Hepatobiliary: No focal liver abnormality is seen. No gallstones,
gallbladder wall thickening, or biliary dilatation.

Pancreas: Unremarkable. No pancreatic ductal dilatation or
surrounding inflammatory changes.

Spleen: Normal in size without focal abnormality.

Adrenals/Urinary Tract: Adrenal glands are unremarkable. Kidneys are
normal, without renal calculi, focal lesion, or hydronephrosis.
Bladder is unremarkable.

Stomach/Bowel: Stomach is within normal limits. Appendix appears
normal. No evidence of bowel wall thickening, distention, or
inflammatory changes.

Vascular/Lymphatic: No significant vascular findings are present. No
enlarged abdominal or pelvic lymph nodes.

Reproductive: The uterus is unremarkable. A 4.4 cm x 3.3 cm area of
heterogeneous attenuation is seen along the right adnexa (axial CT
images 63 through 71, CT series 3).

Other: No abdominal wall hernia or abnormality. No abdominopelvic
ascites.

Musculoskeletal: No acute or significant osseous findings.
IMPRESSION: Area of heterogeneous attenuation along the right adnexa which may
represent a hemorrhagic ovarian cyst. Correlation with pelvic
ultrasound is recommended.

## 2023-11-28 ENCOUNTER — Encounter: Payer: Self-pay | Admitting: Obstetrics and Gynecology

## 2023-11-28 ENCOUNTER — Ambulatory Visit

## 2023-11-28 DIAGNOSIS — Z3200 Encounter for pregnancy test, result unknown: Secondary | ICD-10-CM

## 2023-11-28 NOTE — Progress Notes (Signed)
 BHCG per Dr.Duncan

## 2023-11-29 LAB — BETA HCG QUANT (REF LAB): hCG Quant: 313 m[IU]/mL

## 2023-11-30 ENCOUNTER — Other Ambulatory Visit (INDEPENDENT_AMBULATORY_CARE_PROVIDER_SITE_OTHER)

## 2023-11-30 DIAGNOSIS — Z3200 Encounter for pregnancy test, result unknown: Secondary | ICD-10-CM

## 2023-11-30 NOTE — Progress Notes (Signed)
 Pt here for repeat BHCG.  Will schedule NOB accordingly

## 2023-12-01 ENCOUNTER — Encounter: Payer: Self-pay | Admitting: Obstetrics and Gynecology

## 2023-12-01 LAB — BETA HCG QUANT (REF LAB): hCG Quant: 750 m[IU]/mL

## 2023-12-02 ENCOUNTER — Ambulatory Visit

## 2023-12-02 DIAGNOSIS — Z3200 Encounter for pregnancy test, result unknown: Secondary | ICD-10-CM

## 2023-12-02 DIAGNOSIS — O3680X0 Pregnancy with inconclusive fetal viability, not applicable or unspecified: Secondary | ICD-10-CM

## 2023-12-02 NOTE — Progress Notes (Signed)
 BHCG per Dr.Duncan

## 2023-12-03 ENCOUNTER — Encounter: Payer: Self-pay | Admitting: Obstetrics and Gynecology

## 2023-12-03 LAB — BETA HCG QUANT (REF LAB): hCG Quant: 1830 m[IU]/mL

## 2023-12-03 NOTE — Addendum Note (Signed)
 Addended by: Milas Hock A on: 12/03/2023 11:29 AM   Modules accepted: Orders

## 2023-12-05 ENCOUNTER — Ambulatory Visit

## 2023-12-05 DIAGNOSIS — Z3A01 Less than 8 weeks gestation of pregnancy: Secondary | ICD-10-CM | POA: Diagnosis not present

## 2023-12-05 DIAGNOSIS — O3680X0 Pregnancy with inconclusive fetal viability, not applicable or unspecified: Secondary | ICD-10-CM | POA: Diagnosis not present

## 2023-12-06 ENCOUNTER — Other Ambulatory Visit: Payer: Self-pay | Admitting: *Deleted

## 2023-12-06 ENCOUNTER — Encounter: Payer: Self-pay | Admitting: Obstetrics and Gynecology

## 2023-12-06 ENCOUNTER — Other Ambulatory Visit

## 2023-12-06 DIAGNOSIS — O3680X0 Pregnancy with inconclusive fetal viability, not applicable or unspecified: Secondary | ICD-10-CM

## 2023-12-13 ENCOUNTER — Encounter: Payer: Self-pay | Admitting: Obstetrics and Gynecology

## 2023-12-16 ENCOUNTER — Ambulatory Visit

## 2023-12-16 DIAGNOSIS — O3680X0 Pregnancy with inconclusive fetal viability, not applicable or unspecified: Secondary | ICD-10-CM | POA: Diagnosis not present

## 2023-12-16 DIAGNOSIS — Z3A01 Less than 8 weeks gestation of pregnancy: Secondary | ICD-10-CM | POA: Diagnosis not present

## 2023-12-20 ENCOUNTER — Encounter: Payer: Self-pay | Admitting: Obstetrics and Gynecology

## 2023-12-27 ENCOUNTER — Other Ambulatory Visit: Payer: Self-pay | Admitting: Obstetrics and Gynecology

## 2023-12-27 DIAGNOSIS — O0991 Supervision of high risk pregnancy, unspecified, first trimester: Secondary | ICD-10-CM

## 2024-01-01 ENCOUNTER — Encounter: Payer: Self-pay | Admitting: Obstetrics and Gynecology
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
## 2024-01-10 ENCOUNTER — Ambulatory Visit

## 2024-01-10 DIAGNOSIS — Z3A11 11 weeks gestation of pregnancy: Secondary | ICD-10-CM

## 2024-01-10 DIAGNOSIS — O0991 Supervision of high risk pregnancy, unspecified, first trimester: Secondary | ICD-10-CM

## 2024-01-12 ENCOUNTER — Encounter: Payer: Self-pay | Admitting: Obstetrics and Gynecology

## 2024-01-12 ENCOUNTER — Ambulatory Visit: Admitting: Obstetrics and Gynecology

## 2024-01-12 ENCOUNTER — Other Ambulatory Visit (HOSPITAL_COMMUNITY)
Admission: RE | Admit: 2024-01-12 | Discharge: 2024-01-12 | Disposition: A | Discharge status: Home / Self Care | Source: Ambulatory Visit | Attending: Obstetrics and Gynecology | Admitting: Obstetrics and Gynecology

## 2024-01-12 VITALS — BP 133/89 | HR 90 | Wt 208.0 lb

## 2024-01-12 DIAGNOSIS — Z3A11 11 weeks gestation of pregnancy: Secondary | ICD-10-CM

## 2024-01-12 DIAGNOSIS — O99341 Other mental disorders complicating pregnancy, first trimester: Secondary | ICD-10-CM

## 2024-01-12 DIAGNOSIS — Z113 Encounter for screening for infections with a predominantly sexual mode of transmission: Secondary | ICD-10-CM | POA: Diagnosis not present

## 2024-01-12 DIAGNOSIS — O0991 Supervision of high risk pregnancy, unspecified, first trimester: Secondary | ICD-10-CM | POA: Diagnosis present

## 2024-01-12 DIAGNOSIS — Z98891 History of uterine scar from previous surgery: Secondary | ICD-10-CM

## 2024-01-12 DIAGNOSIS — Z1151 Encounter for screening for human papillomavirus (HPV): Secondary | ICD-10-CM | POA: Diagnosis present

## 2024-01-12 DIAGNOSIS — Z3481 Encounter for supervision of other normal pregnancy, first trimester: Secondary | ICD-10-CM

## 2024-01-12 DIAGNOSIS — O09291 Supervision of pregnancy with other poor reproductive or obstetric history, first trimester: Secondary | ICD-10-CM

## 2024-01-12 DIAGNOSIS — Z124 Encounter for screening for malignant neoplasm of cervix: Secondary | ICD-10-CM | POA: Insufficient documentation

## 2024-01-12 DIAGNOSIS — F419 Anxiety disorder, unspecified: Secondary | ICD-10-CM | POA: Insufficient documentation

## 2024-01-12 DIAGNOSIS — O09299 Supervision of pregnancy with other poor reproductive or obstetric history, unspecified trimester: Secondary | ICD-10-CM

## 2024-01-12 DIAGNOSIS — Z3A10 10 weeks gestation of pregnancy: Secondary | ICD-10-CM

## 2024-01-12 DIAGNOSIS — O0993 Supervision of high risk pregnancy, unspecified, third trimester: Secondary | ICD-10-CM

## 2024-01-12 MED ORDER — ASPIRIN 81 MG PO TBEC: 162.0000 mg | DELAYED_RELEASE_TABLET | Freq: Every day | ORAL | 12 refills | Status: DC

## 2024-01-13 LAB — PROTEIN / CREATININE RATIO, URINE
Creatinine, Urine: 75.2 mg/dL
Protein, Ur: 7.6 mg/dL
Protein/Creat Ratio: 101 mg/g{creat} (ref 0–200)

## 2024-01-13 LAB — COMPREHENSIVE METABOLIC PANEL WITH GFR
ALT: 17 IU/L (ref 0–32)
AST: 17 IU/L (ref 0–40)
Albumin: 4.2 g/dL (ref 4.0–5.0)
Alkaline Phosphatase: 85 IU/L (ref 44–121)
BUN/Creatinine Ratio: 13 (ref 9–23)
BUN: 6 mg/dL (ref 6–20)
Bilirubin Total: <0.2 mg/dL (ref 0.0–1.2)
CO2: 20 mmol/L (ref 20–29)
Calcium: 9.9 mg/dL (ref 8.7–10.2)
Chloride: 99 mmol/L (ref 96–106)
Creatinine, Ser: 0.48 mg/dL — ABNORMAL LOW (ref 0.57–1.00)
Globulin, Total: 2.7 g/dL (ref 1.5–4.5)
Glucose: 85 mg/dL (ref 70–99)
Potassium: 4 mmol/L (ref 3.5–5.2)
Sodium: 135 mmol/L (ref 134–144)
Total Protein: 6.9 g/dL (ref 6.0–8.5)
eGFR: 131 mL/min/{1.73_m2} (ref >59)

## 2024-01-13 LAB — CBC/D/PLT+RPR+RH+ABO+RUBIGG...
Antibody Screen: NEGATIVE
Basophils Absolute: 0 10*3/uL (ref 0.0–0.2)
Basos: 0 %
EOS (ABSOLUTE): 0.1 10*3/uL (ref 0.0–0.4)
Eos: 1 %
HCV Ab: NONREACTIVE
HIV Screen 4th Generation wRfx: NONREACTIVE
Hematocrit: 38.1 % (ref 34.0–46.6)
Hemoglobin: 12.8 g/dL (ref 11.1–15.9)
Hepatitis B Surface Ag: NEGATIVE
Immature Grans (Abs): 0 10*3/uL (ref 0.0–0.1)
Immature Granulocytes: 0 %
Lymphocytes Absolute: 2.3 10*3/uL (ref 0.7–3.1)
Lymphs: 20 %
MCH: 29.2 pg (ref 26.6–33.0)
MCHC: 33.6 g/dL (ref 31.5–35.7)
MCV: 87 fL (ref 79–97)
Monocytes Absolute: 0.6 10*3/uL (ref 0.1–0.9)
Monocytes: 5 %
Neutrophils Absolute: 8.5 10*3/uL — ABNORMAL HIGH (ref 1.4–7.0)
Neutrophils: 74 %
Platelets: 348 10*3/uL (ref 150–450)
RBC: 4.39 x10E6/uL (ref 3.77–5.28)
RDW: 12.6 % (ref 11.7–15.4)
RPR Ser Ql: NONREACTIVE
Rh Factor: POSITIVE
Rubella Antibodies, IGG: 1 {index} (ref >0.99)
WBC: 11.6 10*3/uL — ABNORMAL HIGH (ref 3.4–10.8)

## 2024-01-13 LAB — HEMOGLOBIN A1C
Est. average glucose Bld gHb Est-mCnc: 94 mg/dL
Hgb A1c MFr Bld: 4.9 % (ref 4.8–5.6)

## 2024-01-13 LAB — HCV INTERPRETATION

## 2024-01-13 LAB — TSH RFX ON ABNORMAL TO FREE T4: TSH: 1.09 u[IU]/mL (ref 0.450–4.500)

## 2024-01-14 LAB — CULTURE, OB URINE

## 2024-01-14 LAB — URINE CULTURE, OB REFLEX

## 2024-01-16 LAB — CYTOLOGY - PAP
Adequacy: ABSENT
Chlamydia: NEGATIVE
Comment: NEGATIVE
Comment: NEGATIVE
Comment: NORMAL
Diagnosis: NEGATIVE
High risk HPV: NEGATIVE
Neisseria Gonorrhea: NEGATIVE

## 2024-01-17 ENCOUNTER — Encounter: Payer: Self-pay | Admitting: Obstetrics and Gynecology

## 2024-01-18 ENCOUNTER — Encounter: Payer: Self-pay | Admitting: Obstetrics and Gynecology

## 2024-01-18 LAB — PANORAMA PRENATAL TEST FULL PANEL:PANORAMA TEST PLUS 5 ADDITIONAL MICRODELETIONS: FETAL FRACTION: 6.9

## 2024-01-31 ENCOUNTER — Ambulatory Visit: Admitting: Obstetrics and Gynecology

## 2024-01-31 ENCOUNTER — Telehealth: Payer: Self-pay

## 2024-01-31 VITALS — BP 127/85 | HR 91 | Wt 210.1 lb

## 2024-01-31 DIAGNOSIS — O0993 Supervision of high risk pregnancy, unspecified, third trimester: Secondary | ICD-10-CM | POA: Diagnosis not present

## 2024-01-31 DIAGNOSIS — G43109 Migraine with aura, not intractable, without status migrainosus: Secondary | ICD-10-CM | POA: Diagnosis not present

## 2024-01-31 MED ORDER — PROMETHAZINE HCL 12.5 MG PO TABS: 12.5000 mg | ORAL_TABLET | Freq: Four times a day (QID) | ORAL | 1 refills | Status: DC | PRN

## 2024-01-31 MED ORDER — PROPRANOLOL HCL 20 MG PO TABS: 20.0000 mg | ORAL_TABLET | Freq: Two times a day (BID) | ORAL | 1 refills | Status: DC
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
## 2024-01-31 NOTE — Telephone Encounter (Signed)
 Left message for pt to call back about headache appt.

## 2024-02-07 ENCOUNTER — Ambulatory Visit: Admitting: Obstetrics and Gynecology

## 2024-02-07 ENCOUNTER — Ambulatory Visit

## 2024-02-07 ENCOUNTER — Encounter: Payer: Self-pay | Admitting: Obstetrics and Gynecology

## 2024-02-07 VITALS — BP 124/84 | HR 86 | Ht 62.0 in | Wt 211.0 lb

## 2024-02-07 DIAGNOSIS — O10919 Unspecified pre-existing hypertension complicating pregnancy, unspecified trimester: Secondary | ICD-10-CM

## 2024-02-07 DIAGNOSIS — O0990 Supervision of high risk pregnancy, unspecified, unspecified trimester: Secondary | ICD-10-CM

## 2024-02-07 MED ORDER — LABETALOL HCL 100 MG PO TABS
100.0000 mg | ORAL_TABLET | Freq: Two times a day (BID) | ORAL | 2 refills | Status: DC
Start: 2024-02-07 — End: 2024-07-26

## 2024-02-07 NOTE — Progress Notes (Signed)
 Subjective:  Susan Krause is a 33 y.o. female here for BP check.   Hypertension ROS: Patient denies any visual symptoms, RUQ/epigastric pain or other concerning symptoms. Headaches unchanged, still present.   Objective:  LMP 10/25/2023 (Exact Date)   Appearance alert, well appearing, and in no distress. General exam BP noted to be 135/87 today in office, home BP cuff and monitor reading 138/94. 2nd reading at end of nurse visit 124/84.   Assessment:   Blood Pressure slightly elevated. Reviewed BP instructions and how to check at home with blood pressure values and symptoms to report.  Plan: Reviewed with Almond Jaffe, NP,  Per Almond Jaffe, NP, CBC, CMP and protein/creatinine with Labetalol  100mg  to twice daily. BP re-check scheduled for 02/10/24.  Evelia Hipp, RN

## 2024-02-08 LAB — CBC
Hematocrit: 38.9 % (ref 34.0–46.6)
Hemoglobin: 13.1 g/dL (ref 11.1–15.9)
MCH: 29.4 pg (ref 26.6–33.0)
MCHC: 33.7 g/dL (ref 31.5–35.7)
MCV: 87 fL (ref 79–97)
Platelets: 301 10*3/uL (ref 150–450)
RBC: 4.45 x10E6/uL (ref 3.77–5.28)
RDW: 12.7 % (ref 11.7–15.4)
WBC: 10.6 10*3/uL (ref 3.4–10.8)

## 2024-02-08 LAB — COMPREHENSIVE METABOLIC PANEL WITH GFR
ALT: 12 IU/L (ref 0–32)
AST: 15 IU/L (ref 0–40)
Albumin: 4 g/dL (ref 4.0–5.0)
Alkaline Phosphatase: 72 IU/L (ref 44–121)
BUN/Creatinine Ratio: 12 (ref 9–23)
BUN: 5 mg/dL — ABNORMAL LOW (ref 6–20)
Bilirubin Total: 0.3 mg/dL (ref 0.0–1.2)
CO2: 20 mmol/L (ref 20–29)
Calcium: 9.6 mg/dL (ref 8.7–10.2)
Chloride: 101 mmol/L (ref 96–106)
Creatinine, Ser: 0.43 mg/dL — ABNORMAL LOW (ref 0.57–1.00)
Globulin, Total: 2.5 g/dL (ref 1.5–4.5)
Glucose: 80 mg/dL (ref 70–99)
Potassium: 4 mmol/L (ref 3.5–5.2)
Sodium: 137 mmol/L (ref 134–144)
Total Protein: 6.5 g/dL (ref 6.0–8.5)
eGFR: 134 mL/min/{1.73_m2} (ref >59)

## 2024-02-08 LAB — PROTEIN / CREATININE RATIO, URINE
Creatinine, Urine: 87.1 mg/dL
Protein, Ur: 6.6 mg/dL
Protein/Creat Ratio: 76 mg/g{creat} (ref 0–200)

## 2024-02-10 ENCOUNTER — Ambulatory Visit

## 2024-02-10 ENCOUNTER — Ambulatory Visit: Payer: Self-pay | Admitting: Obstetrics and Gynecology

## 2024-02-10 NOTE — Progress Notes (Signed)
 Taking meds as ordered. No problems today. Has headaches everyday but not BP related.Will f/u with [facility] for these.

## 2024-02-10 NOTE — Progress Notes (Signed)
 Susan Krause is a 33 y.o. female [phone] @ [redacted]w[redacted]d with a history of preeclampsia with previous pregnancy, here in the office for a BP check.   She is on baby scripts and reports several elevated BP readings today at home.  She reports BP has been running high 130's/80's-140's-90's for the past few weeks.   She is currently on labetalol  100 mg qPM for headaches only. We discussed changing to propranolol last week for HA management, however [name] did not feel comfortable making this switch due to the pharmacist warning her against propanol as it "crosses the placenta".  Currently she has no HA, no vision changes or swelling.   She brought her BP cuff into the office today which was calibrated by the RN and proven to be reliable.   BPs in the office consistent with home BP readings high 130's/90's. BP not entered in the chart by RN.   A:  cHTN at 15 weeks History of preeclampsia   P:  Increase labetalol  to 100 mg BID Baseline Pre E labs today Continue BASA Preeclampsia precautions reviewed Return to the office Friday 5/30 for BP check.  [name], NP 02/10/2024 8:41 AM

## 2024-02-28 ENCOUNTER — Ambulatory Visit (INDEPENDENT_AMBULATORY_CARE_PROVIDER_SITE_OTHER): Admitting: Obstetrics and Gynecology

## 2024-02-28 ENCOUNTER — Encounter: Payer: Self-pay | Admitting: Obstetrics and Gynecology

## 2024-02-28 VITALS — BP 124/81 | HR 94 | Wt 212.0 lb

## 2024-02-28 DIAGNOSIS — Z3A18 18 weeks gestation of pregnancy: Secondary | ICD-10-CM

## 2024-02-28 DIAGNOSIS — O10919 Unspecified pre-existing hypertension complicating pregnancy, unspecified trimester: Secondary | ICD-10-CM

## 2024-02-28 DIAGNOSIS — G43109 Migraine with aura, not intractable, without status migrainosus: Secondary | ICD-10-CM

## 2024-02-28 DIAGNOSIS — O09299 Supervision of pregnancy with other poor reproductive or obstetric history, unspecified trimester: Secondary | ICD-10-CM

## 2024-02-28 DIAGNOSIS — O0992 Supervision of high risk pregnancy, unspecified, second trimester: Secondary | ICD-10-CM

## 2024-02-28 DIAGNOSIS — Z98891 History of uterine scar from previous surgery: Secondary | ICD-10-CM

## 2024-02-28 DIAGNOSIS — F419 Anxiety disorder, unspecified: Secondary | ICD-10-CM
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
## 2024-03-01 DIAGNOSIS — O99210 Obesity complicating pregnancy, unspecified trimester: Secondary | ICD-10-CM | POA: Insufficient documentation

## 2024-03-08 ENCOUNTER — Telehealth: Payer: Self-pay

## 2024-03-08 ENCOUNTER — Encounter: Payer: Self-pay | Admitting: Obstetrics and Gynecology

## 2024-03-08 NOTE — Telephone Encounter (Signed)
 RN received mychart messages from patient regarding patient feeling lightheaded and dizzy. RN attempted telephone call to check in with patient, left HIPAA compliant voicemail to return RN call.  Silvano LELON Piano, RN

## 2024-03-09 ENCOUNTER — Ambulatory Visit: Admitting: *Deleted

## 2024-03-09 IMAGING — US US OB TRANSVAGINAL
1 series · 15 of 28 positions shown · non-contrast
Comparison: 11/19/2021

CLINICAL DATA: First trimester pregnancy with inconclusive fetal
viability.

EXAM:
TRANSVAGINAL OB ULTRASOUND
TECHNIQUE: Transvaginal ultrasound was performed for complete evaluation of the
gestation as well as the maternal uterus, adnexal regions, and
pelvic cul-de-sac.

[Series 1: us ob transvaginal · 52 acquisitions, 15 frames shown]
[im 1/52]
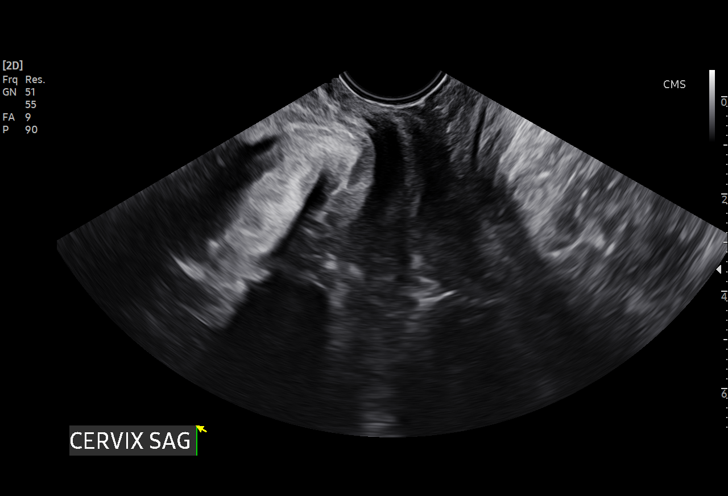
[im 4/52]
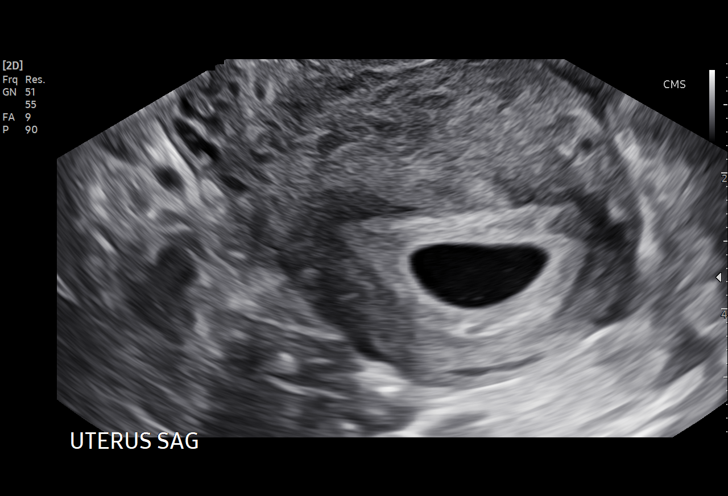
[im 8/52]
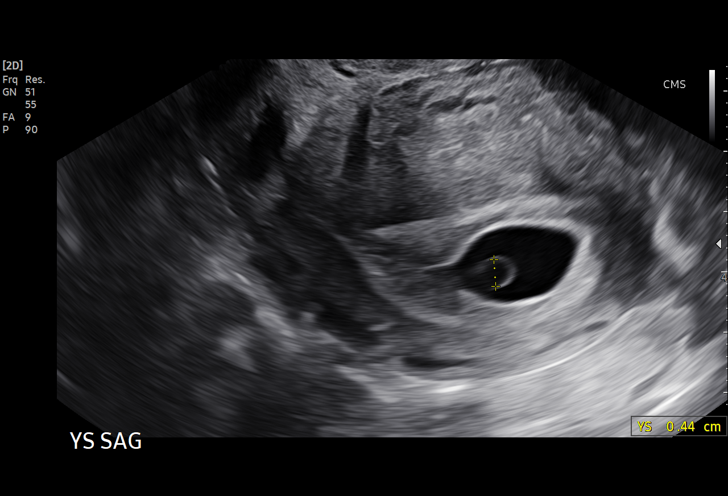
[im 12/52]
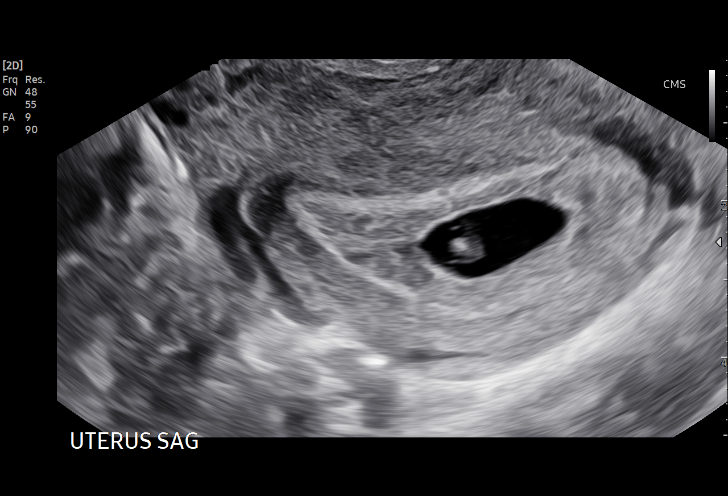
[im 16/52]
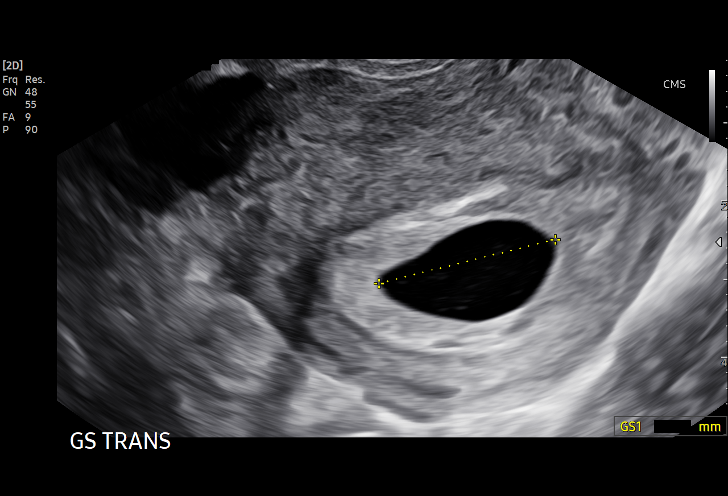
[im 19/52]
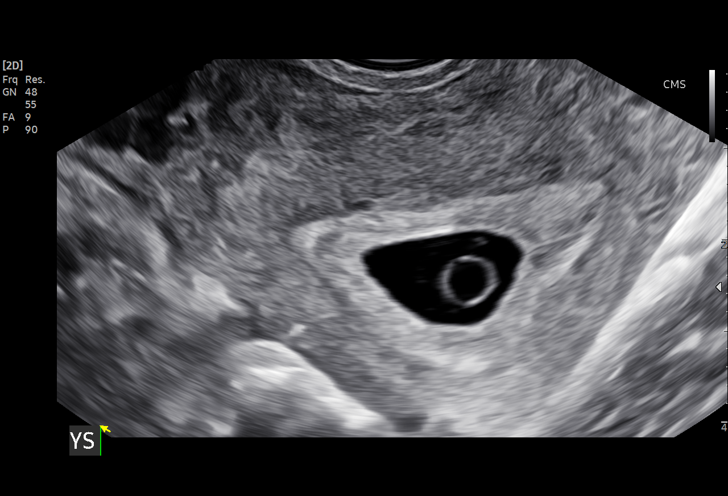
[im 23/52]
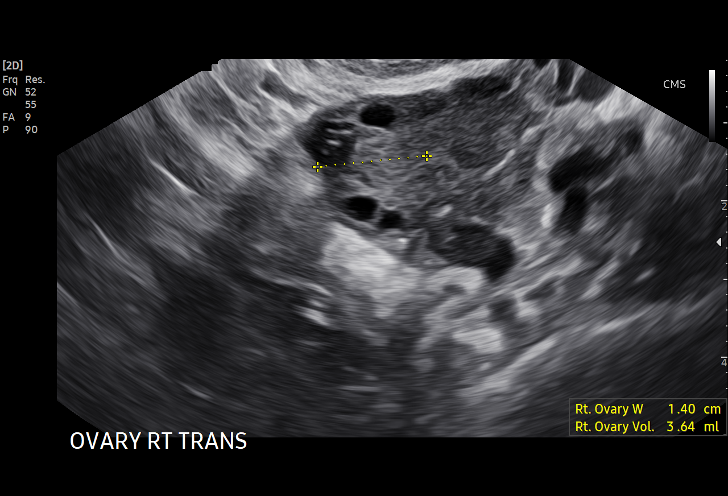
[im 27/52]
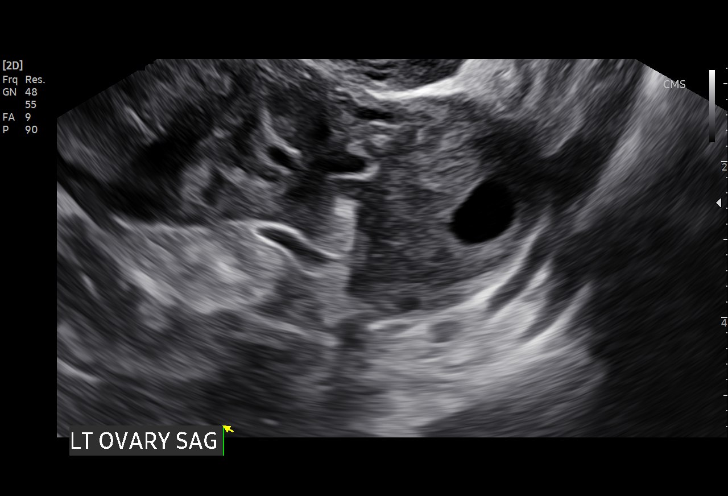
[im 29/52]
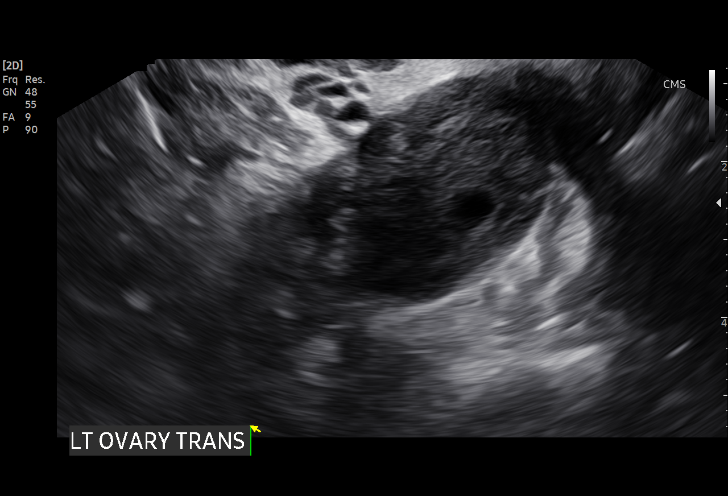
[im 33/52]
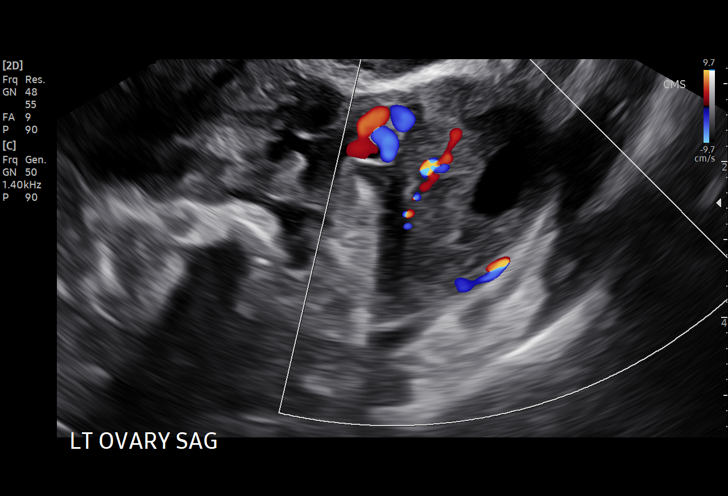
[im 36/52]
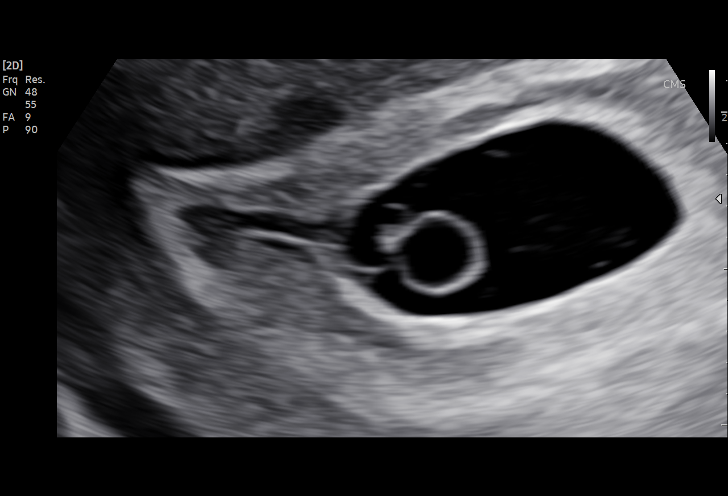
[im 40/52]
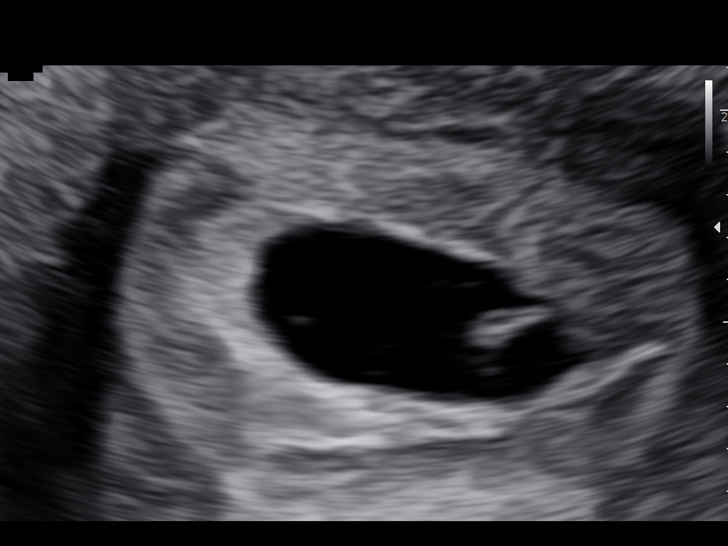
[im 44/52]
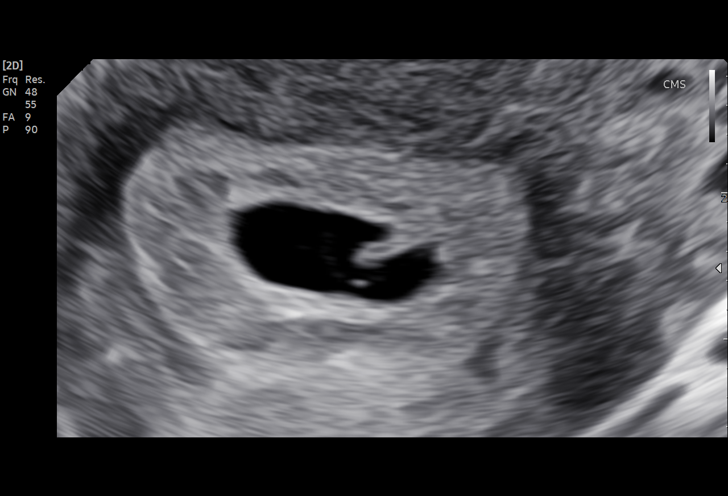
[im 48/52]
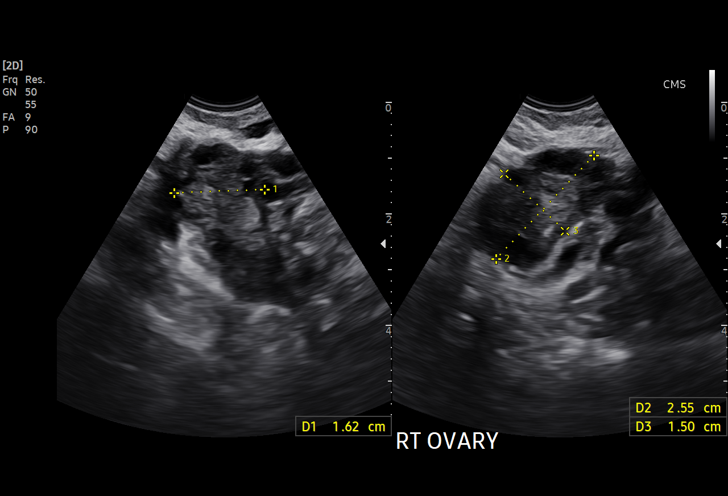
[im 52/52]
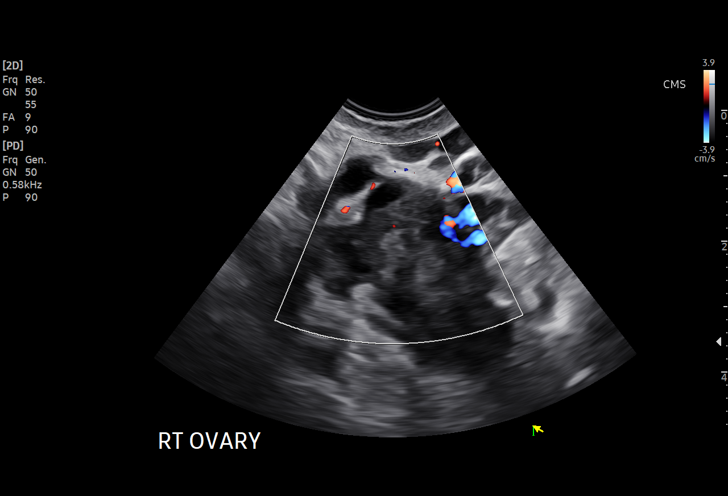

[15 of 28 positions shown; findings below may reference images not displayed]

FINDINGS: Intrauterine gestational sac: Single

Yolk sac:  Visualized.

Embryo:  Visualized.

Cardiac Activity: Not Visualized.

CRL:   4 mm   6 w 1 d                  US EDC: 07/27/2022

Subchorionic hemorrhage:  None visualized.

Maternal uterus/adnexae: Both ovaries are normal in appearance. No
mass or abnormal free fluid identified.
IMPRESSION: Single IUP shows less than expected progression since prior exam.
This is suspicious but not yet definitive for failed pregnancy.
Recommend further follow-up US in 1 week for definitive diagnosis.
This recommendation follows SRU consensus guidelines: Diagnostic
Criteria for Nonviable Pregnancy Early in the First Trimester. N
Engl J Med 1469; [DATE].

These results were called by telephone at the time of interpretation
on 12/02/2021 at [DATE] to provider Dr. Amardeep, who verbally
acknowledged these results.

## 2024-03-09 NOTE — Progress Notes (Signed)
 Pt came in the office for orthostatic B/p check. Sitting- 119/78, standing  -120/73,  supine-120/73.

## 2024-03-13 ENCOUNTER — Ambulatory Visit (HOSPITAL_BASED_OUTPATIENT_CLINIC_OR_DEPARTMENT_OTHER): Admitting: Maternal & Fetal Medicine

## 2024-03-13 ENCOUNTER — Ambulatory Visit: Attending: Obstetrics and Gynecology

## 2024-03-13 VITALS — BP 126/81 | HR 98

## 2024-03-13 DIAGNOSIS — Z3A20 20 weeks gestation of pregnancy: Secondary | ICD-10-CM

## 2024-03-13 DIAGNOSIS — O09292 Supervision of pregnancy with other poor reproductive or obstetric history, second trimester: Secondary | ICD-10-CM | POA: Diagnosis not present

## 2024-03-13 DIAGNOSIS — O99212 Obesity complicating pregnancy, second trimester: Secondary | ICD-10-CM | POA: Diagnosis not present

## 2024-03-13 DIAGNOSIS — O99210 Obesity complicating pregnancy, unspecified trimester: Secondary | ICD-10-CM

## 2024-03-13 DIAGNOSIS — E669 Obesity, unspecified: Secondary | ICD-10-CM | POA: Diagnosis not present

## 2024-03-13 DIAGNOSIS — Z98891 History of uterine scar from previous surgery: Secondary | ICD-10-CM | POA: Diagnosis present

## 2024-03-13 DIAGNOSIS — O34219 Maternal care for unspecified type scar from previous cesarean delivery: Secondary | ICD-10-CM | POA: Diagnosis not present

## 2024-03-13 DIAGNOSIS — O0991 Supervision of high risk pregnancy, unspecified, first trimester: Secondary | ICD-10-CM | POA: Diagnosis present
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
## 2024-03-14 ENCOUNTER — Other Ambulatory Visit: Payer: Self-pay | Admitting: *Deleted

## 2024-03-14 DIAGNOSIS — O99210 Obesity complicating pregnancy, unspecified trimester: Secondary | ICD-10-CM

## 2024-03-14 DIAGNOSIS — O10919 Unspecified pre-existing hypertension complicating pregnancy, unspecified trimester: Secondary | ICD-10-CM

## 2024-03-16 IMAGING — US US OB TRANSVAGINAL
1 series · 15 of 28 positions shown · non-contrast
Comparison: [DATE] [DATE], [DATE].  [DATE] [DATE], [DATE].

CLINICAL DATA: Fetal viability.

EXAM:
TRANSVAGINAL OB ULTRASOUND
TECHNIQUE: Transvaginal ultrasound was performed for complete evaluation of the
gestation as well as the maternal uterus, adnexal regions, and
pelvic cul-de-sac.

[Series 1: us ob transvaginal · 30 acquisitions, 15 frames shown]
[im 1/30]
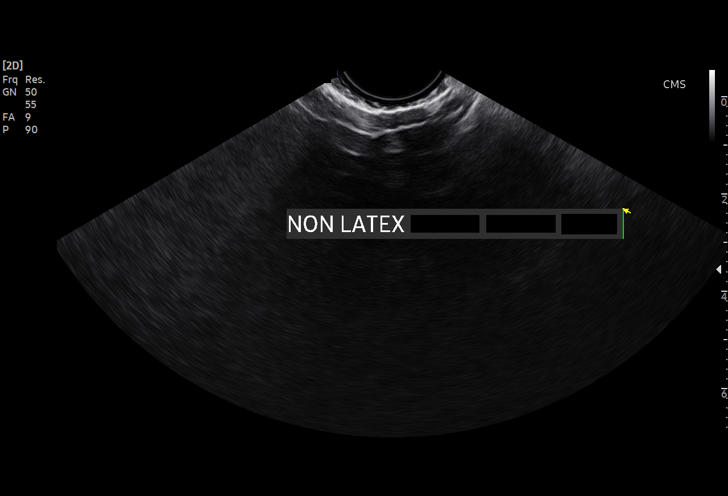
[im 3/30]
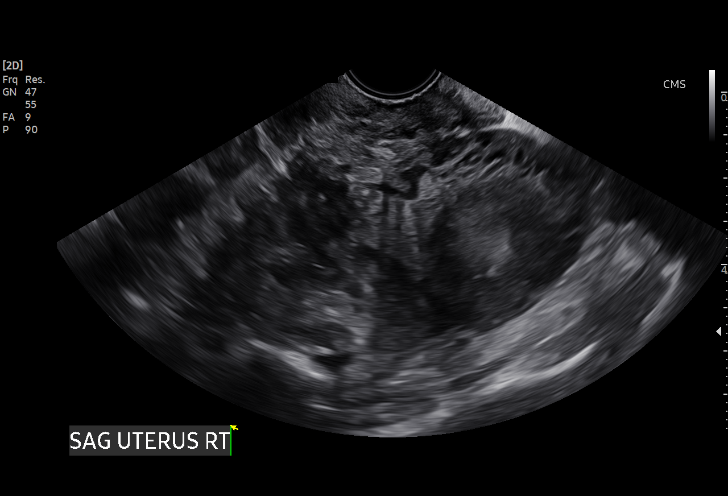
[im 5/30]
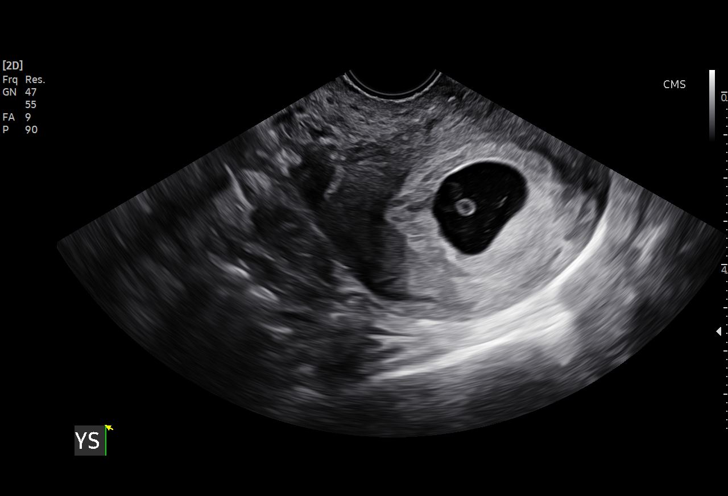
[im 7/30]
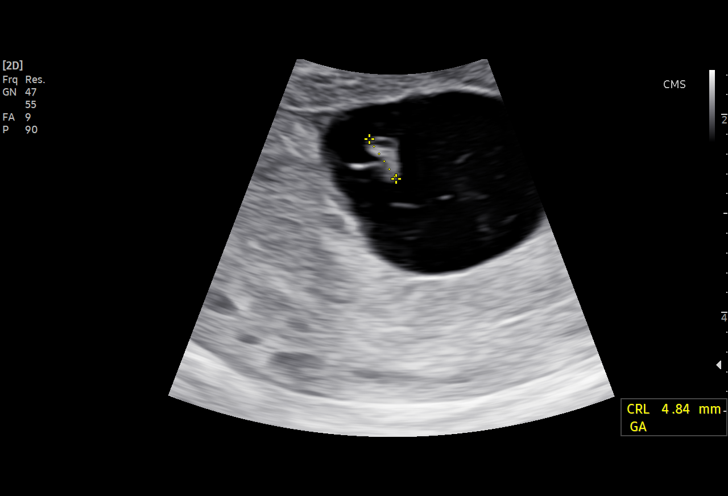
[im 9/30]
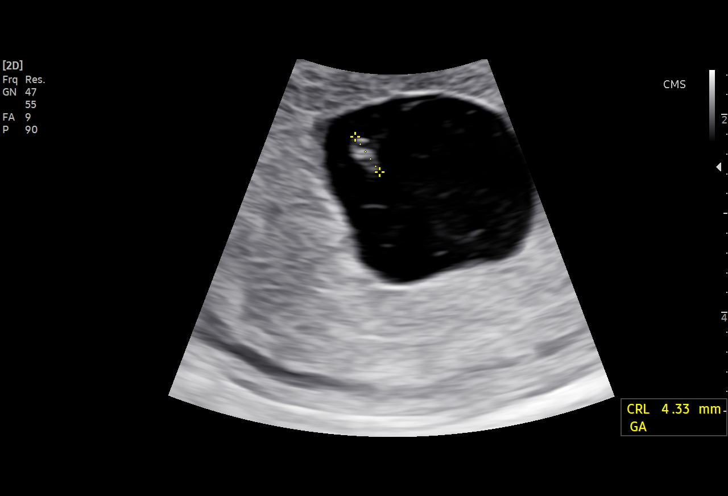
[im 11/30]
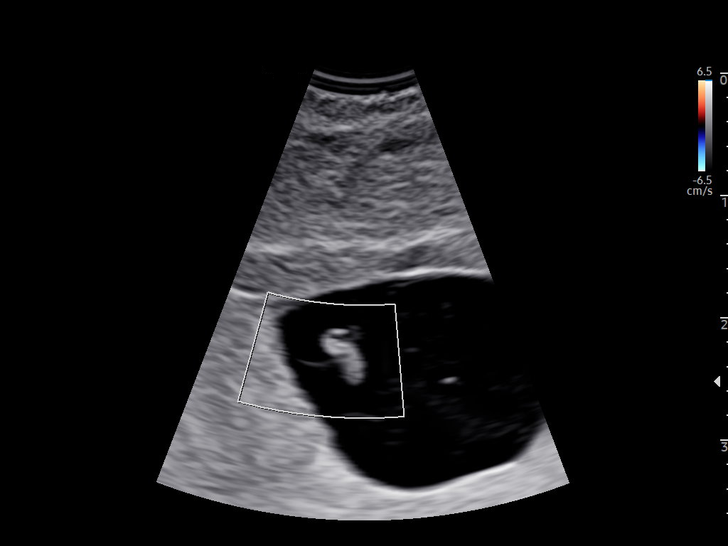
[im 13/30]
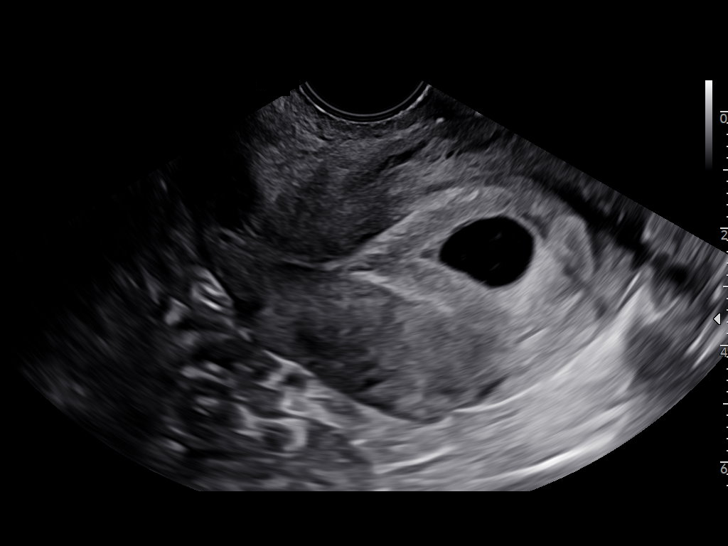
[im 16/30]
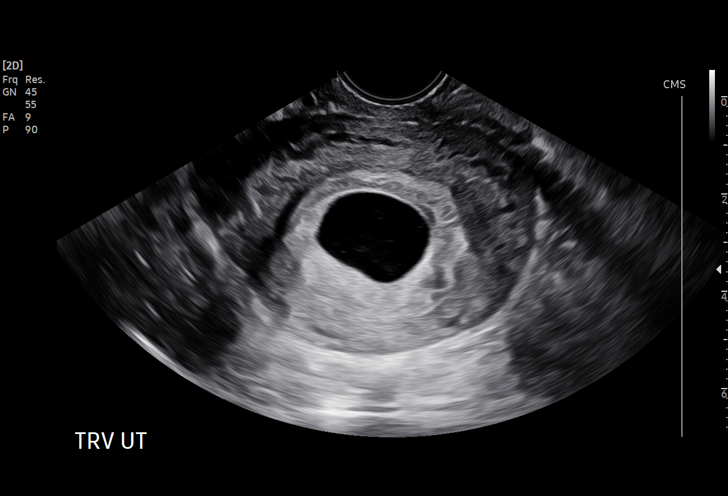
[im 17/30]
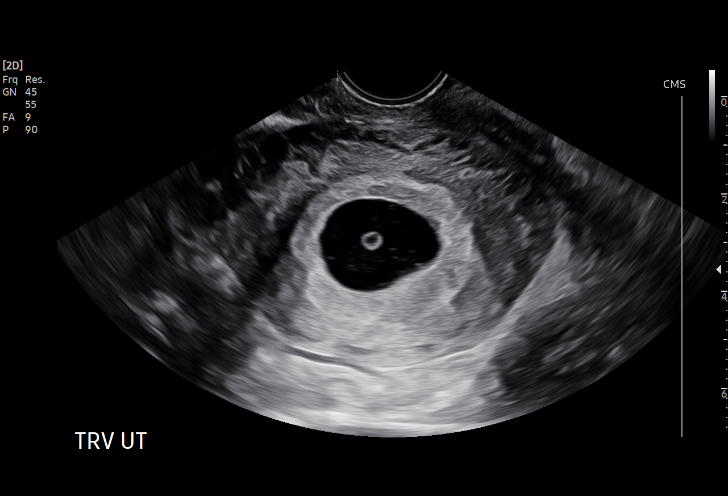
[im 19/30]
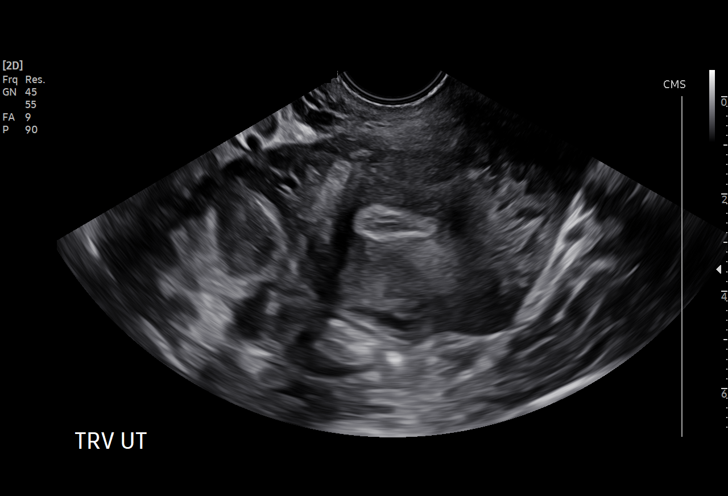
[im 21/30]
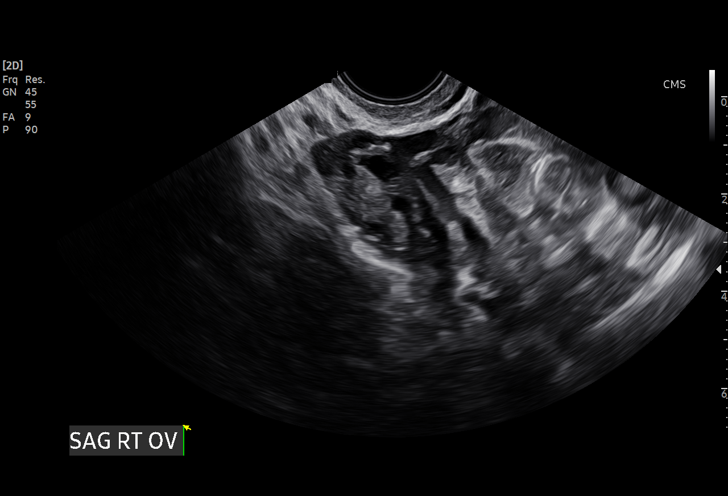
[im 23/30]
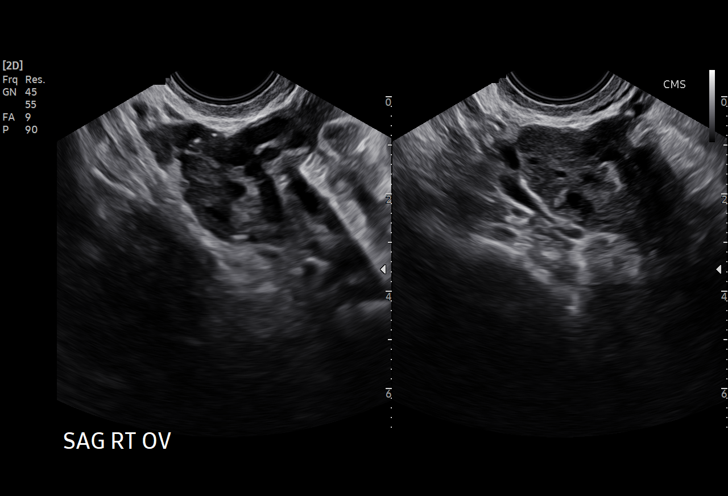
[im 25/30]
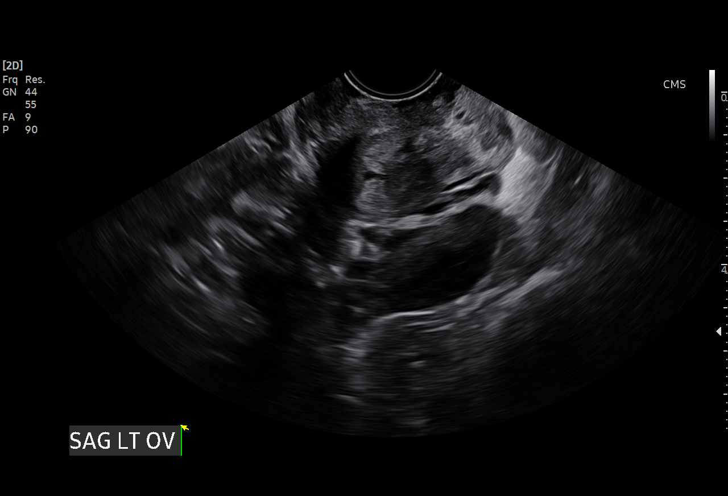
[im 27/30]
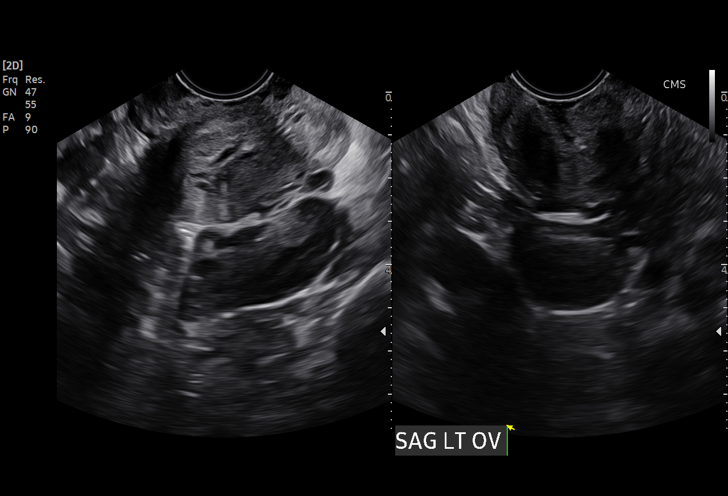
[im 30/30]
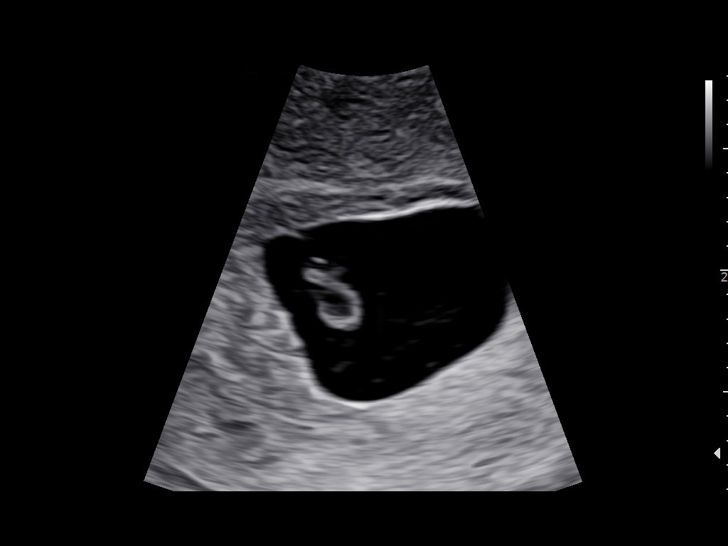

[15 of 28 positions shown; findings below may reference images not displayed]

FINDINGS: Intrauterine gestational sac: Single

Yolk sac:  Visualized.

Embryo:  Visualized.

Cardiac Activity: Not Visualized.

CRL:   4.36 mm   6 w 1 d                  US EDC: August 03, 2022.

Subchorionic hemorrhage:  None visualized.

Maternal uterus/adnexae: Probable corpus luteum seen in left ovary.
Right ovary is unremarkable. No free fluid is noted.
IMPRESSION: Findings are suspicious but not yet definitive for failed pregnancy.
Recommend follow-up US in 10-14 days for definitive diagnosis. This
recommendation follows SRU consensus guidelines: Diagnostic Criteria
for Nonviable Pregnancy Early in the First Trimester. N Engl J Med

## 2024-03-21 ENCOUNTER — Other Ambulatory Visit: Payer: Self-pay | Admitting: Obstetrics and Gynecology

## 2024-03-22 ENCOUNTER — Ambulatory Visit

## 2024-03-22 DIAGNOSIS — Z0130 Encounter for examination of blood pressure without abnormal findings: Secondary | ICD-10-CM | POA: Diagnosis not present

## 2024-03-22 NOTE — Progress Notes (Signed)
 Subjective:  Susan Krause is a 33 y.o. female here for BP check.   Hypertension ROS: Patient denies any headaches, visual symptoms, RUQ/epigastric pain or other concerning symptoms.  Objective:  BP 128/82   Pulse 92   Wt 219 lb 1.3 oz (99.4 kg)   LMP 10/25/2023 (Exact Date)   BMI 40.07 kg/m   Appearance alert, well appearing, and in no distress and oriented to person, place, and time. General exam BP noted to be good today in office. Pt reported only taking BP Medication once a day at night.   Assessment:   Blood Pressure stable.   Plan:  Current treatment plan is effective and reviewed with Dr.Duncan and advised pt to take BP Medication as prescribed.   Delon Maiden, CMA

## 2024-03-23 ENCOUNTER — Encounter: Payer: Self-pay | Admitting: Physician Assistant

## 2024-03-23 ENCOUNTER — Ambulatory Visit (INDEPENDENT_AMBULATORY_CARE_PROVIDER_SITE_OTHER): Admitting: Physician Assistant

## 2024-03-23 VITALS — BP 122/79 | HR 91 | Wt 225.0 lb

## 2024-03-23 DIAGNOSIS — G43109 Migraine with aura, not intractable, without status migrainosus: Secondary | ICD-10-CM | POA: Diagnosis not present

## 2024-03-23 DIAGNOSIS — R519 Headache, unspecified: Secondary | ICD-10-CM

## 2024-03-23 DIAGNOSIS — O26892 Other specified pregnancy related conditions, second trimester: Secondary | ICD-10-CM | POA: Diagnosis not present

## 2024-03-23 DIAGNOSIS — Z3A21 21 weeks gestation of pregnancy: Secondary | ICD-10-CM

## 2024-03-23 MED ORDER — CYCLOBENZAPRINE HCL 10 MG PO TABS
10.0000 mg | ORAL_TABLET | Freq: Three times a day (TID) | ORAL | 1 refills | Status: DC | PRN
Start: 2024-03-23 — End: 2024-06-06

## 2024-03-23 MED ORDER — BUTALBITAL-APAP-CAFFEINE 50-325-40 MG PO CAPS: 1.0000 | ORAL_CAPSULE | Freq: Four times a day (QID) | ORAL | 3 refills | Status: DC | PRN

## 2024-03-23 NOTE — Progress Notes (Signed)
 CC: New Headache    History:  Susan Krause is a 33 y.o. H2E7957 who presents to clinic today for new headache evaluation.  She started getting them when she was 33 years old.  She has extensive family history of migraine. The can be severe and last several days.  There is nausea and occasional vomiting.  The pain is located in the front on the left mostly.  It is pulsating.  There is pain with moving eyes, light, noise, smells and all movement in general.  There is an aura of flashes about 1 hour prior to HA.  She sometimes wakes with the severe HA.   She has worked to reduce triggers.  She has used propanolol and labetalol  for blood pressure (during pregnancy) and migraine management.  She has used aleve, ibuprofen , excedrin, tylenol , caffeine .   She is using PNV, prozac, asa, labetalol .  Number of days in the last 4 weeks with:  Severe headache: 7 Moderate headache:7 Mild headache: 0 No headache: 14  Past Medical History:  Diagnosis Date   ADD (attention deficit disorder) 07/30/2009   Overview:   Attention-deficit Hyperactivity Disorder     ICD-10 cut over      ADHD (attention deficit hyperactivity disorder)    No meds   Cervical dysplasia    Chlamydia    Complication of anesthesia    woke up during wisdom teeth and had PVCs   Endometriosis 06/19/2020   Head injury with loss of consciousness (HCC) 03/2020   Hypertension    Hypoglycemia    Migraine    Propanolol 10 mg   Migraine with aura and without status migrainosus, not intractable 12/09/2017   Takes labetalol  daily for this, previously on propranolol      OCD (obsessive compulsive disorder)    Ovarian cyst    PTSD (post-traumatic stress disorder)    no meds   PVC's (premature ventricular contractions) 2018   has palpations when she has PV's.   Sinus tachycardia    UTI (urinary tract infection)     Social History   Socioeconomic History   Marital status: Married    Spouse name: Not on file   Number of children: Not on  file   Years of education: Not on file   Highest education level: Not on file  Occupational History   Not on file  Tobacco Use   Smoking status: Never   Smokeless tobacco: Never  Vaping Use   Vaping status: Never Used  Substance and Sexual Activity   Alcohol use: No   Drug use: No   Sexual activity: Yes    Birth control/protection: None  Other Topics Concern   Not on file  Social History Narrative   Not on file   Social Drivers of Health   Financial Resource Strain: Medium Risk (10/25/2023)   Received from [facility]   Overall Financial Resource Strain (CARDIA)    Difficulty of Paying Living Expenses: Somewhat hard  Food Insecurity: Food Insecurity Present (10/25/2023)   Received from [facility]   Hunger Vital Sign    Within the past 12 months, you worried that your food would run out before you got the money to buy more.: Sometimes true    Within the past 12 months, the food you bought just didn't last and you didn't have money to get more.: Sometimes true  Transportation Needs: No Transportation Needs (10/25/2023)   Received from [facility] - Transportation    Lack of Transportation (Medical): No  Lack of Transportation (Non-Medical): No  Physical Activity: Insufficiently Active (10/25/2023)   Received from [facility]   Exercise Vital Sign    On average, how many days per week do you engage in moderate to strenuous exercise (like a brisk walk)?: 3 days    On average, how many minutes do you engage in exercise at this level?: 30 min  Stress: Stress Concern Present (10/25/2023)   Received from [facility] - Occupational Stress Questionnaire    Feeling of Stress : To some extent  Social Connections: Moderately Integrated (10/25/2023)   Received from [facility]   Social Network    How would you rate your social network (family, work, friends)?: Adequate participation with social networks  Intimate Partner  Violence: Not At Risk (10/25/2023)   Received from [facility]   HITS    Over the last 12 months how often did your partner physically hurt you?: Never    Over the last 12 months how often did your partner insult you or talk down to you?: Never    Over the last 12 months how often did your partner threaten you with physical harm?: Never    Over the last 12 months how often did your partner scream or curse at you?: Never    Family History  Problem Relation Age of Onset   Diabetes Mother    Cancer Mother        stage 2 breast   ADD / ADHD Mother    Anxiety disorder Mother    Depression Mother    Hyperlipidemia Mother    Obesity Mother    Depression Father    ADD / ADHD Father    Anxiety disorder Sister    Asthma Sister    Depression Sister    ADD / ADHD Sister    Intellectual disability Brother    Learning disabilities Brother    Anxiety disorder Maternal Aunt    Diabetes Maternal Aunt    Alcohol abuse Maternal Uncle    Hyperlipidemia Maternal Uncle    Anxiety disorder Paternal Uncle    Diabetes Paternal Uncle    Anxiety disorder Maternal Grandmother    Diabetes Maternal Grandmother    Hyperlipidemia Maternal Grandmother    Varicose Veins Maternal Grandmother    Cancer Maternal Grandfather    Hyperlipidemia Maternal Grandfather    Obesity Maternal Grandfather    Vision loss Maternal Grandfather    Cancer Paternal Grandmother     Allergies  Allergen Reactions   Banana Swelling   Latex Swelling   Peanut-Containing Drug Products Anaphylaxis   Hpv Bival (Type 16,18) Recomb Vaccine  [Human Papillomavirus 2-Valent Recombinant Vaccine] Hives   Iodinated Contrast Media Diarrhea, Palpitations and Other (See Comments)   Pumpkin Flavoring Agent (Non-Screening) Swelling   Lactose Intolerance (Gi) Other (See Comments)    Gi upset    Current Outpatient Medications on File Prior to Visit  Medication Sig Dispense Refill   acetaminophen  (TYLENOL ) 500 MG tablet Take 2 tablets  (1,000 mg total) by mouth every 6 (six) hours as needed. 100 tablet 0   aspirin  EC 81 MG tablet Take 2 tablets (162 mg total) by mouth daily. Swallow whole. Start at 12-14 weeks 60 tablet 12   FLUoxetine HCl (PROZAC PO) Take 1 tablet by mouth daily.     labetalol  (NORMODYNE ) 100 MG tablet Take 1 tablet (100 mg total) by mouth 2 (two) times daily. (Patient taking differently: Take 100 mg by mouth  2 (two) times daily. Pt reported she is only taking once a day at night.) 60 tablet 2   Prenatal Multivit-Min-Fe-FA (PRENATAL 1 + IRON PO)      No current facility-administered medications on file prior to visit.     Review of Systems:  All pertinent positive/negative included in HPI, all other review of systems are negative   Objective:  Physical Exam BP 122/79   Pulse 91   Wt 225 lb (102.1 kg)   LMP 10/25/2023 (Exact Date)   BMI 41.15 kg/m  CONSTITUTIONAL: Well-developed, well-nourished female in no acute distress.  EYES: EOM intact ENT: Normocephalic CARDIOVASCULAR: Regular rate  RESPIRATORY: Normal rate.   MUSCULOSKELETAL: Normal ROM SKIN: Warm, dry without erythema  NEUROLOGICAL: Alert, oriented, CN II-XII grossly intact, Appropriate balance  PSYCH: Normal behavior, mood   Assessment & Plan:  Assessment: 1. Migraine with aura and without status migrainosus, not intractable   2. Headache in pregnancy, antepartum, second trimester      Plan: Flexeril  - use 1/2 to 1 tab in the evening regularly to decrease baseline muscle tension - sedation precautions discussed - can use up to 3 times daily as tolerated to abort a headache.  Fioricet  1-2 tabs every 4-6 hours as needed for more significant migraine pain - limit use to 2 days per week - do not use additional acetaminophen  or caffeine  Magnesium  400units daily for migraine prevention in pregnancy - okay to use Calm brand supplement Use ice/heat liberally Can consider phenergan  to help with horrible migraine and/or nausea.  Can also  consider metoclopramide .   Follow-up  PRN  43 minutes of face to face time direct with patient this encounter Nance Gretta Darice FORBES, PA-C 03/23/2024 9:04 AM

## 2024-03-23 NOTE — Patient Instructions (Signed)
 Migraine Headache A migraine headache is a very strong throbbing pain on one or both sides of your head. This type of headache can also cause other symptoms. It can last from 4 hours to 3 days. Talk with your doctor about what things may bring on (trigger) this condition. What are the causes? The exact cause of a migraine is not known. This condition may be brought on or caused by: Smoking. Medicines, such as: Medicine used to treat chest pain (nitroglycerin). Birth control pills. Estrogen. Some blood pressure medicines. Certain substances in some foods or drinks. Foods and drinks, such as: Cheese. Chocolate. Alcohol. Caffeine. Doing physical activity that is very hard. Other things that may trigger a migraine headache include: Periods. Pregnancy. Hunger. Stress. Getting too much or too little sleep. Weather changes. Feeling tired (fatigue). What increases the risk? Being 18-65 years old. Being female. Having a family history of migraine headaches. Being Caucasian. Having a mental health condition, such as being sad (depressed) or feeling worried or nervous (anxious). Being very overweight (obese). What are the signs or symptoms? A throbbing pain. This pain may: Happen in any area of the head, such as on one or both sides. Make it hard to do daily activities. Get worse with physical activity. Get worse around bright lights, loud noises, or smells. Other symptoms may include: Feeling like you may vomit (nauseous). Vomiting. Dizziness. Before a migraine headache starts, you may get warning signs (an aura). An aura may include: Seeing flashing lights or having blind spots. Seeing bright spots, halos, or zigzag lines. Having tunnel vision or blurred vision. Having numbness or a tingling feeling. Having trouble talking. Having weak muscles. After a migraine ends, you may have symptoms. These may include: Tiredness. Trouble thinking (concentrating). How is this  treated? Taking medicines that: Relieve pain. Relieve the feeling like you may vomit. Prevent migraine headaches. Treatment may also include: Acupuncture. Lifestyle changes like avoiding foods that bring on migraine headaches. Learning ways to control your body functions (biofeedback). Therapy to help you know and deal with negative thoughts (cognitive behavioral therapy). Follow these instructions at home: Medicines Take over-the-counter and prescription medicines only as told by your doctor. If told, take steps to prevent problems with pooping (constipation). You may need to: Drink enough fluid to keep your pee (urine) pale yellow. Take medicines. You will be told what medicines to take. Eat foods that are high in fiber. These include beans, whole grains, and fresh fruits and vegetables. Limit foods that are high in fat and sugar. These include fried or sweet foods. Ask your doctor if you should avoid driving or using machines while you are taking your medicine. Lifestyle  Do not drink alcohol. Do not smoke or use any products that contain nicotine or tobacco. If you need help quitting, ask your doctor. Get 7-9 hours of sleep each night, or the amount recommended by your doctor. Find ways to deal with stress, such as meditation, deep breathing, or yoga. Try to exercise often. This can help lessen how bad and how often your migraines happen. General instructions Keep a journal to find out what may bring on your migraine headaches. This can help you avoid those things. For example, write down: What you eat and drink. How much sleep you get. Any change to your medicines or diet. If you have a migraine headache: Avoid things that make your symptoms worse, such as bright lights. Lie down in a dark, quiet room. Do not drive or use machinery. Ask your  doctor what activities are safe for you. Where to find more information Coalition for Headache and Migraine Patients (CHAMP):  headachemigraine.org American Migraine Foundation: americanmigrainefoundation.org National Headache Foundation: headaches.org Contact a doctor if: You get a migraine headache that is different or worse than others you have had. You have more than 15 days of headaches in one month. Get help right away if: Your migraine headache gets very bad. Your migraine headache lasts more than 72 hours. You have a fever or stiff neck. You have trouble seeing. Your muscles feel weak or like you cannot control them. You lose your balance a lot. You have trouble walking. You faint. You have a seizure. This information is not intended to replace advice given to you by your health care provider. Make sure you discuss any questions you have with your health care provider. Document Revised: 04/26/2022 Document Reviewed: 04/26/2022 Elsevier Patient Education  2024 [employer].

## 2024-03-27 ENCOUNTER — Ambulatory Visit (INDEPENDENT_AMBULATORY_CARE_PROVIDER_SITE_OTHER): Admitting: Obstetrics and Gynecology

## 2024-03-27 VITALS — BP 133/82 | HR 97 | Wt 222.0 lb

## 2024-03-27 DIAGNOSIS — O0993 Supervision of high risk pregnancy, unspecified, third trimester: Secondary | ICD-10-CM

## 2024-03-27 DIAGNOSIS — O10919 Unspecified pre-existing hypertension complicating pregnancy, unspecified trimester: Secondary | ICD-10-CM | POA: Diagnosis not present
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
## 2024-04-10 ENCOUNTER — Ambulatory Visit

## 2024-04-11 ENCOUNTER — Ambulatory Visit: Attending: Maternal & Fetal Medicine

## 2024-04-11 ENCOUNTER — Ambulatory Visit: Admitting: Obstetrics and Gynecology

## 2024-04-11 VITALS — BP 124/77 | HR 81

## 2024-04-11 DIAGNOSIS — O99212 Obesity complicating pregnancy, second trimester: Secondary | ICD-10-CM

## 2024-04-11 DIAGNOSIS — O10919 Unspecified pre-existing hypertension complicating pregnancy, unspecified trimester: Secondary | ICD-10-CM | POA: Insufficient documentation

## 2024-04-11 DIAGNOSIS — O34219 Maternal care for unspecified type scar from previous cesarean delivery: Secondary | ICD-10-CM | POA: Diagnosis not present

## 2024-04-11 DIAGNOSIS — O99210 Obesity complicating pregnancy, unspecified trimester: Secondary | ICD-10-CM | POA: Insufficient documentation

## 2024-04-11 DIAGNOSIS — O09292 Supervision of pregnancy with other poor reproductive or obstetric history, second trimester: Secondary | ICD-10-CM | POA: Diagnosis not present

## 2024-04-11 DIAGNOSIS — E669 Obesity, unspecified: Secondary | ICD-10-CM

## 2024-04-11 DIAGNOSIS — O10012 Pre-existing essential hypertension complicating pregnancy, second trimester: Secondary | ICD-10-CM

## 2024-04-11 DIAGNOSIS — Z3A24 24 weeks gestation of pregnancy: Secondary | ICD-10-CM | POA: Diagnosis present

## 2024-04-11 DIAGNOSIS — O09299 Supervision of pregnancy with other poor reproductive or obstetric history, unspecified trimester: Secondary | ICD-10-CM | POA: Diagnosis present

## 2024-04-11 NOTE — Progress Notes (Signed)
 Maternal-Fetal Medicine Consultation Name: Susan Krause MRN: [id number]  G7 E7957 at 24-weeks' gestation.  Patient is here for fetal growth assessment and completion of fetal anatomy. - Chronic hypertension.  Patient takes labetalol  and her blood pressure today at our office is 124/77 mmHg. - Previous 2 cesarean deliveries. - Short interval between pregnancies.  Ultrasound Fetal growth is appropriate for gestational age.  Amniotic fluid is normal good fetal activity seen.  Fetal anatomical survey appears normal.  Placenta is posterior and there is no evidence of previa or placenta accreta spectrum.  Previous cesarean deliveries Patient desires 1 more pregnancy.  I counseled the patient that repeat cesarean deliveries increase the risk of placenta previa and/or placenta accreta spectrum.  She is planning to have repeat cesarean delivery.  Short Interpregnancy Interval It is defined as the time interval (18 to 24 months) between the end of previous pregnancy and the beginning of next pregnancy. The impact of short pregnancy interval on the outcome of subsequent pregnancy is uncertain. Some studies have shown that congenital anomalies, preterm delivery, and fetal growth restriction rates are increased in pregnancies with short interpregnancy interval.  However, it is not supported by other reports. Overall, we should expect good pregnancy outcomes if there are no other high-risk factors.  I discussed the safety profile of labetalol  and that it can be associated with a slight increase in fetal growth restriction.  Patient takes low-dose aspirin  prophylaxis.   Recommendations - Fetal growth assessment in 4 weeks. - Weekly antenatal testing from [redacted] weeks gestation until delivery.    Consultation including face-to-face (more than 50%) counseling 20 minutes.

## 2024-04-24 ENCOUNTER — Ambulatory Visit: Admitting: Obstetrics and Gynecology

## 2024-04-24 ENCOUNTER — Other Ambulatory Visit: Payer: Self-pay | Admitting: Obstetrics and Gynecology

## 2024-04-24 ENCOUNTER — Other Ambulatory Visit (HOSPITAL_COMMUNITY)
Admission: RE | Admit: 2024-04-24 | Discharge: 2024-04-24 | Disposition: A | Discharge status: Home / Self Care | Source: Ambulatory Visit | Attending: Obstetrics and Gynecology | Admitting: Obstetrics and Gynecology

## 2024-04-24 VITALS — BP 118/80 | HR 92 | Wt 227.0 lb

## 2024-04-24 DIAGNOSIS — O0992 Supervision of high risk pregnancy, unspecified, second trimester: Secondary | ICD-10-CM | POA: Insufficient documentation

## 2024-04-24 DIAGNOSIS — N898 Other specified noninflammatory disorders of vagina: Secondary | ICD-10-CM | POA: Diagnosis present

## 2024-04-24 MED ORDER — FAMOTIDINE 20 MG PO TABS: 20.0000 mg | ORAL_TABLET | Freq: Two times a day (BID) | ORAL | 3 refills | Status: DC
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
## 2024-04-25 LAB — CBC WITH DIFFERENTIAL/PLATELET
Basophils Absolute: 0 x10E3/uL (ref 0.0–0.2)
Basos: 0 %
EOS (ABSOLUTE): 0.1 x10E3/uL (ref 0.0–0.4)
Eos: 1 %
Hematocrit: 35.1 % (ref 34.0–46.6)
Hemoglobin: 11.5 g/dL (ref 11.1–15.9)
Immature Grans (Abs): 0.1 x10E3/uL (ref 0.0–0.1)
Immature Granulocytes: 1 %
Lymphocytes Absolute: 2 x10E3/uL (ref 0.7–3.1)
Lymphs: 21 %
MCH: 29.9 pg (ref 26.6–33.0)
MCHC: 32.8 g/dL (ref 31.5–35.7)
MCV: 91 fL (ref 79–97)
Monocytes Absolute: 0.4 x10E3/uL (ref 0.1–0.9)
Monocytes: 5 %
Neutrophils Absolute: 6.8 x10E3/uL (ref 1.4–7.0)
Neutrophils: 72 %
Platelets: 245 x10E3/uL (ref 150–450)
RBC: 3.84 x10E6/uL (ref 3.77–5.28)
RDW: 12.7 % (ref 11.7–15.4)
WBC: 9.4 x10E3/uL (ref 3.4–10.8)

## 2024-04-25 LAB — CERVICOVAGINAL ANCILLARY ONLY
Bacterial Vaginitis (gardnerella): NEGATIVE
Candida Glabrata: NEGATIVE
Candida Vaginitis: POSITIVE — AB
Comment: NEGATIVE
Comment: NEGATIVE
Comment: NEGATIVE

## 2024-04-26 ENCOUNTER — Other Ambulatory Visit: Payer: Self-pay

## 2024-04-26 ENCOUNTER — Ambulatory Visit: Payer: Self-pay | Admitting: Obstetrics and Gynecology

## 2024-05-08 ENCOUNTER — Ambulatory Visit (HOSPITAL_BASED_OUTPATIENT_CLINIC_OR_DEPARTMENT_OTHER): Attending: Obstetrics and Gynecology | Admitting: Obstetrics

## 2024-05-08 ENCOUNTER — Ambulatory Visit (HOSPITAL_BASED_OUTPATIENT_CLINIC_OR_DEPARTMENT_OTHER)

## 2024-05-08 ENCOUNTER — Ambulatory Visit (INDEPENDENT_AMBULATORY_CARE_PROVIDER_SITE_OTHER): Admitting: Obstetrics and Gynecology

## 2024-05-08 ENCOUNTER — Other Ambulatory Visit: Payer: Self-pay | Admitting: *Deleted

## 2024-05-08 ENCOUNTER — Other Ambulatory Visit

## 2024-05-08 VITALS — BP 120/79 | HR 101 | Wt 230.0 lb

## 2024-05-08 VITALS — BP 120/74

## 2024-05-08 DIAGNOSIS — Z3A28 28 weeks gestation of pregnancy: Secondary | ICD-10-CM | POA: Diagnosis not present

## 2024-05-08 DIAGNOSIS — O09292 Supervision of pregnancy with other poor reproductive or obstetric history, second trimester: Secondary | ICD-10-CM | POA: Diagnosis not present

## 2024-05-08 DIAGNOSIS — O99212 Obesity complicating pregnancy, second trimester: Secondary | ICD-10-CM | POA: Diagnosis not present

## 2024-05-08 DIAGNOSIS — O10012 Pre-existing essential hypertension complicating pregnancy, second trimester: Secondary | ICD-10-CM

## 2024-05-08 DIAGNOSIS — O99210 Obesity complicating pregnancy, unspecified trimester: Secondary | ICD-10-CM

## 2024-05-08 DIAGNOSIS — O10919 Unspecified pre-existing hypertension complicating pregnancy, unspecified trimester: Secondary | ICD-10-CM

## 2024-05-08 DIAGNOSIS — Z1331 Encounter for screening for depression: Secondary | ICD-10-CM | POA: Diagnosis not present

## 2024-05-08 DIAGNOSIS — O10013 Pre-existing essential hypertension complicating pregnancy, third trimester: Secondary | ICD-10-CM | POA: Insufficient documentation

## 2024-05-08 DIAGNOSIS — O10913 Unspecified pre-existing hypertension complicating pregnancy, third trimester: Secondary | ICD-10-CM | POA: Diagnosis not present

## 2024-05-08 DIAGNOSIS — O99213 Obesity complicating pregnancy, third trimester: Secondary | ICD-10-CM | POA: Insufficient documentation

## 2024-05-08 DIAGNOSIS — E669 Obesity, unspecified: Secondary | ICD-10-CM

## 2024-05-08 DIAGNOSIS — O34219 Maternal care for unspecified type scar from previous cesarean delivery: Secondary | ICD-10-CM

## 2024-05-08 DIAGNOSIS — Z98891 History of uterine scar from previous surgery: Secondary | ICD-10-CM

## 2024-05-08 DIAGNOSIS — O09293 Supervision of pregnancy with other poor reproductive or obstetric history, third trimester: Secondary | ICD-10-CM | POA: Insufficient documentation

## 2024-05-08 DIAGNOSIS — O0993 Supervision of high risk pregnancy, unspecified, third trimester: Secondary | ICD-10-CM
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
## 2024-05-08 NOTE — Progress Notes (Signed)
 MFM Consult Note  Susan Krause is currently at 28 weeks and 0 days.  She has been followed due to maternal obesity and chronic hypertension treated with labetalol .    She denies any problems since her last exam.  Her blood pressure today was 120/74.  On today's exam, the overall EFW of 2 pounds 9 ounces measures at the 35th percentile for her gestational age.    There was normal amniotic fluid noted with a total AFI of 11.  Due to chronic hypertension treated with labetalol , we will start weekly fetal testing at 32 weeks.    She will return in 4 weeks for a growth scan and BPP.  As her blood pressures were difficult to control during her last pregnancy late in the third trimester, she will have a repeat cesarean delivery scheduled at around 37 weeks.  The patient stated that all of her questions were answered today.  A total of 20 minutes was spent counseling and coordinating the care for this patient.  Greater than 50% of the time was spent in direct face-to-face contact.

## 2024-05-09 LAB — GLUCOSE TOLERANCE, 2 HOURS W/ 1HR
Glucose, 1 hour: 115 mg/dL (ref 70–179)
Glucose, 2 hour: 91 mg/dL (ref 70–152)
Glucose, Fasting: 73 mg/dL (ref 70–91)

## 2024-05-09 LAB — CBC
Hematocrit: 34.2 % (ref 34.0–46.6)
Hemoglobin: 11.3 g/dL (ref 11.1–15.9)
MCH: 29.4 pg (ref 26.6–33.0)
MCHC: 33 g/dL (ref 31.5–35.7)
MCV: 89 fL (ref 79–97)
Platelets: 256 x10E3/uL (ref 150–450)
RBC: 3.85 x10E6/uL (ref 3.77–5.28)
RDW: 13 % (ref 11.7–15.4)
WBC: 11.2 x10E3/uL — ABNORMAL HIGH (ref 3.4–10.8)

## 2024-05-09 LAB — RPR: RPR Ser Ql: NONREACTIVE

## 2024-05-09 LAB — HIV ANTIBODY (ROUTINE TESTING W REFLEX): HIV Screen 4th Generation wRfx: NONREACTIVE

## 2024-05-13 ENCOUNTER — Ambulatory Visit: Payer: Self-pay | Admitting: Obstetrics and Gynecology

## 2024-05-14 ENCOUNTER — Encounter: Payer: Self-pay | Admitting: Obstetrics & Gynecology

## 2024-05-15 ENCOUNTER — Telehealth: Payer: Self-pay

## 2024-05-15 NOTE — Telephone Encounter (Signed)
 RN received babyscripts notifications of elevated blood pressures. RN attempted follow up with patient, left HIPAA compliant voicemail to return RN call.  Silvano LELON Piano, RN

## 2024-05-17 ENCOUNTER — Encounter (HOSPITAL_COMMUNITY): Payer: Self-pay | Admitting: Obstetrics & Gynecology

## 2024-05-17 ENCOUNTER — Telehealth: Payer: Self-pay

## 2024-05-17 ENCOUNTER — Other Ambulatory Visit: Payer: Self-pay

## 2024-05-17 ENCOUNTER — Inpatient Hospital Stay (HOSPITAL_COMMUNITY)
Admission: AD | Admit: 2024-05-17 | Discharge: 2024-05-17 | Disposition: A | Discharge status: Home / Self Care | Attending: Obstetrics & Gynecology | Admitting: Obstetrics & Gynecology

## 2024-05-17 DIAGNOSIS — M549 Dorsalgia, unspecified: Secondary | ICD-10-CM | POA: Diagnosis not present

## 2024-05-17 DIAGNOSIS — M79605 Pain in left leg: Secondary | ICD-10-CM | POA: Insufficient documentation

## 2024-05-17 DIAGNOSIS — O09293 Supervision of pregnancy with other poor reproductive or obstetric history, third trimester: Secondary | ICD-10-CM | POA: Insufficient documentation

## 2024-05-17 DIAGNOSIS — R109 Unspecified abdominal pain: Secondary | ICD-10-CM | POA: Insufficient documentation

## 2024-05-17 DIAGNOSIS — Z3A29 29 weeks gestation of pregnancy: Secondary | ICD-10-CM | POA: Diagnosis not present

## 2024-05-17 DIAGNOSIS — O10913 Unspecified pre-existing hypertension complicating pregnancy, third trimester: Secondary | ICD-10-CM | POA: Insufficient documentation

## 2024-05-17 DIAGNOSIS — W19XXXA Unspecified fall, initial encounter: Secondary | ICD-10-CM | POA: Diagnosis not present

## 2024-05-17 DIAGNOSIS — O9A213 Injury, poisoning and certain other consequences of external causes complicating pregnancy, third trimester: Secondary | ICD-10-CM

## 2024-05-17 DIAGNOSIS — M79604 Pain in right leg: Secondary | ICD-10-CM | POA: Insufficient documentation

## 2024-05-17 DIAGNOSIS — W138XXA Fall from, out of or through other building or structure, initial encounter: Secondary | ICD-10-CM | POA: Diagnosis not present

## 2024-05-17 NOTE — MAU Note (Signed)
.  Susan Krause is a 33 y.o. at [redacted]w[redacted]d here in MAU reporting: fell through her porch while holding her 20 month old daughter around 76 today. She is unsure if she fell directly on her belly or hit it but states she has been cramping irregularly since the fall. Also having pain on her back and legs. Denies vaginal bleeding or LOF. Endorses FM.  Onset of complaint: 05/17/24 Pain score: 6/10 Vitals:   05/17/24 1831  BP: 124/71  Pulse: 99  Resp: 18  Temp: 99.2 F (37.3 C)  SpO2: 100%     FHT: 140  Lab orders placed from triage: UA

## 2024-05-17 NOTE — MAU Provider Note (Signed)
 Chief Complaint:  Abdominal Pain, Back Pain, and Fall   HPI   None     Susan Krause is a 33 y.o. H2E7957 at [redacted]w[redacted]d who presents to maternity admissions reporting fall earlier today with irregular cramping. She reports falling through the porch while holding her 60 month old daughter today at 13. Everything happened so fast that she is not sure if she hit her abdomen directly in the fall but does not believe she did, she hit the back of her legs and sacrum as she fell backward and caught herself. She endorses pain in her back and legs, and she has been experiencing irregular intermittent cramps since the fall. The cramps were initially every 3 minutes or so but now are every 10 minutes or more. Denies vaginal bleeding, leaking of fluid. Endorses good fetal movement.   Pregnancy Course: Receives care at [facility] for [facility] . Prenatal records reviewed. Pregnancy complicated by cHTN, hx preeclampsia.  Past Medical History:  Diagnosis Date   ADD (attention deficit disorder) 07/30/2009   Overview:   Attention-deficit Hyperactivity Disorder     ICD-10 cut over      ADHD (attention deficit hyperactivity disorder)    No meds   Cervical dysplasia    Chlamydia    Complication of anesthesia    woke up during wisdom teeth and had PVCs   Endometriosis 06/19/2020   Head injury with loss of consciousness (HCC) 03/2020   Hypertension    Hypoglycemia    Migraine    Propanolol 10 mg   Migraine with aura and without status migrainosus, not intractable 12/09/2017   Takes labetalol  daily for this, previously on propranolol      OCD (obsessive compulsive disorder)    Ovarian cyst    PTSD (post-traumatic stress disorder)    no meds   PVC's (premature ventricular contractions) 2018   has palpations when she has PV's.   Sinus tachycardia    UTI (urinary tract infection)    OB History  Gravida Para Term Preterm AB Living  7 2 2  4 2   SAB IAB Ectopic Multiple Live Births  2  2 0 2     # Outcome Date GA Lbr Len/2nd Weight Sex Type Anes PTL Lv  7 Current           6 Term 03/07/23 [redacted]w[redacted]d  3440 g F CS-LTranv Spinal  LIV  5 SAB 12/2021          4 Ectopic 06/2021          3 SAB 05/2021          2 Ectopic 12/20/20 [redacted]w[redacted]d         1 Term 07/18/18 [redacted]w[redacted]d / 04:17 3715 g M CS-Vac EPI  LIV    Obstetric Comments  C/s for fail to descent, pushed for 3 hrs   Past Surgical History:  Procedure Laterality Date   CESAREAN SECTION N/A 07/18/2018   Procedure: CESAREAN SECTION;  Surgeon: Bettina Muskrat, MD;  Location: [facility] BIRTHING SUITES;  Service: Obstetrics;  Laterality: N/A;   CESAREAN SECTION N/A 03/07/2023   Procedure: CESAREAN SECTION;  Surgeon: Lola Donnice HERO, MD;  Location: [facility] LD ORS;  Service: Obstetrics;  Laterality: N/A;   DIAGNOSTIC LAPAROSCOPY WITH REMOVAL OF ECTOPIC PREGNANCY Left 12/20/2020   Procedure: LAPAROSCOPIC LEFT SALPINGECTOMY WITH REMOVAL OF ECTOPIC PREGNANCY;  Surgeon: Fredirick Glenys RAMAN, MD;  Location: [facility] OR;  Service: Gynecology;  Laterality: Left;   DILATION AND CURETTAGE OF UTERUS N/A 07/01/2021  Procedure: DILATATION AND CURETTAGE WITH PATHOLOGY;  Surgeon: Cleatus Moccasin, MD;  Location: [facility] OR;  Service: Gynecology;  Laterality: N/A;   SALPINGECTOMY Left 12/2020   WISDOM TOOTH EXTRACTION     Family History  Problem Relation Age of Onset   Diabetes Mother    Cancer Mother        stage 2 breast   ADD / ADHD Mother    Anxiety disorder Mother    Depression Mother    Hyperlipidemia Mother    Obesity Mother    Depression Father    ADD / ADHD Father    Anxiety disorder Sister    Asthma Sister    Depression Sister    ADD / ADHD Sister    Intellectual disability Brother    Learning disabilities Brother    Anxiety disorder Maternal Aunt    Diabetes Maternal Aunt    Alcohol abuse Maternal Uncle    Hyperlipidemia Maternal Uncle    Anxiety disorder Paternal Uncle    Diabetes Paternal Uncle    Anxiety disorder Maternal Grandmother    Diabetes Maternal  Grandmother    Hyperlipidemia Maternal Grandmother    Varicose Veins Maternal Grandmother    Cancer Maternal Grandfather    Hyperlipidemia Maternal Grandfather    Obesity Maternal Grandfather    Vision loss Maternal Grandfather    Cancer Paternal Grandmother    Social History   Tobacco Use   Smoking status: Never   Smokeless tobacco: Never  Vaping Use   Vaping status: Never Used  Substance Use Topics   Alcohol use: No   Drug use: No   Allergies  Allergen Reactions   Banana Swelling   Latex Swelling   Peanut-Containing Drug Products Anaphylaxis   Hpv Bival (Type 16,18) Recomb Vaccine  [Human Papillomavirus 2-Valent Recombinant Vaccine] Hives   Iodinated Contrast Media Diarrhea, Palpitations and Other (See Comments)   Pumpkin Flavoring Agent (Non-Screening) Swelling   Lactose Intolerance (Gi) Other (See Comments)    Gi upset   Medications Prior to Admission  Medication Sig Dispense Refill Last Dose/Taking   aspirin  EC 81 MG tablet Take 2 tablets (162 mg total) by mouth daily. Swallow whole. Start at 12-14 weeks 60 tablet 12 05/17/2024   FLUoxetine HCl (PROZAC PO) Take 1 tablet by mouth daily.   05/17/2024   labetalol  (NORMODYNE ) 100 MG tablet Take 1 tablet (100 mg total) by mouth 2 (two) times daily. 60 tablet 2 05/17/2024   Prenatal Multivit-Min-Fe-FA (PRENATAL 1 + IRON PO)    05/17/2024   acetaminophen  (TYLENOL ) 500 MG tablet Take 2 tablets (1,000 mg total) by mouth every 6 (six) hours as needed. 100 tablet 0    Butalbital -APAP-Caffeine  50-325-40 MG capsule Take 1-2 capsules by mouth every 6 (six) hours as needed for headache. (Patient not taking: Reported on 05/08/2024) 30 capsule 3    cyclobenzaprine  (FLEXERIL ) 10 MG tablet Take 1 tablet (10 mg total) by mouth every 8 (eight) hours as needed for muscle spasms (headache). (Patient not taking: Reported on 05/08/2024) 30 tablet 1    famotidine  (PEPCID ) 20 MG tablet Take 1 tablet (20 mg total) by mouth 2 (two) times daily. 180 tablet 3      I have reviewed patient's Past Medical Hx, Surgical Hx, Family Hx, Social Hx, medications and allergies.   ROS  Pertinent items noted in HPI and remainder of comprehensive ROS otherwise negative.   PHYSICAL EXAM  Patient Vitals for the past 24 hrs:  BP Temp Temp src Pulse Resp SpO2  05/17/24 1831 124/71 99.2 F (37.3 C) Oral 99 18 100 %    Constitutional: Well-developed, well-nourished female in no acute distress.  HEENT: atraumatic, normocephalic. Neck has normal ROM. EOM intact. Cardiovascular: normal rate & rhythm, warm and well-perfused Respiratory: normal effort, no problems with respiration noted GI: Abd soft, non-tender, non-distended MSK: Extremities nontender, no edema, normal ROM Skin: warm and dry. Acyanotic, no jaundice or pallor. Neurologic: Alert and oriented x 4. No abnormal coordination. Psychiatric: Normal mood. Speech not slurred, not rapid/pressured. Patient is cooperative. GU: no CVA tenderness Pelvic exam: exam chaperoned by Lorice Montenegro RN.  Dilation: Fingertip Effacement (%): Thick Station: Ballotable Presentation: Vertex Exam by:: Abbegayle Denault PA  Fetal Tracing: Baseline FHR: 130 per minute Fetal heart variability: moderate Fetal Heart Rate accelerations: yes Fetal Heart Rate decelerations: none Fetal Non-stress Test: Category I (reactive) Toco: uterine irritability, no regular uterine contractions   Labs: No results found for this or any previous visit (from the past 24 hours).  Imaging:  No results found.  MDM & MAU COURSE  MDM: Moderate  MAU Course: -Vital signs within normal limits. -Category I NST.  -No uterine contractions on toco and patient is not feeling regular contractions. -No cervical dilation on exam. -No direct abdominal trauma and fall was not high impact. No preterm labor.   Orders Placed This Encounter  Procedures   Discharge patient   No orders of the defined types were placed in this encounter.   ASSESSMENT    1. Fall, initial encounter   2. [redacted] weeks gestation of pregnancy     PLAN  Discharge home in stable condition with preterm labor precautions.  Tylenol  and ice for back and legs where she fell.     Allergies as of 05/17/2024       Reactions   Banana Swelling   Latex Swelling   Peanut-containing Drug Products Anaphylaxis   Hpv Bival (type 16,18) Recomb Vaccine  [human Papillomavirus 2-valent Recombinant Vaccine] Hives   Iodinated Contrast Media Diarrhea, Palpitations, Other (See Comments)   Pumpkin Flavoring Agent (non-screening) Swelling   Lactose Intolerance (gi) Other (See Comments)   Gi upset        Medication List     TAKE these medications    Acetaminophen  Extra Strength 500 MG Tabs Take 2 tablets (1,000 mg total) by mouth every 6 (six) hours as needed.   aspirin  EC 81 MG tablet Take 2 tablets (162 mg total) by mouth daily. Swallow whole. Start at 12-14 weeks   Butalbital -APAP-Caffeine  50-325-40 MG capsule Take 1-2 capsules by mouth every 6 (six) hours as needed for headache.   cyclobenzaprine  10 MG tablet Commonly known as: FLEXERIL  Take 1 tablet (10 mg total) by mouth every 8 (eight) hours as needed for muscle spasms (headache).   famotidine  20 MG tablet Commonly known as: PEPCID  Take 1 tablet (20 mg total) by mouth 2 (two) times daily.   labetalol  100 MG tablet Commonly known as: NORMODYNE  Take 1 tablet (100 mg total) by mouth 2 (two) times daily.   PRENATAL 1 + IRON PO   PROZAC PO Take 1 tablet by mouth daily.        Joesph DELENA Sear, PA

## 2024-05-17 NOTE — Discharge Instructions (Signed)

## 2024-05-17 NOTE — Telephone Encounter (Signed)
 Pt called into office reported fell through porch 2 hours ago, has been very crampy since, was holding baby while falling, unsure if hit belly. Pt reported leg bruised as well, resting BP 149/91 and 150/92. Pt reported good fetal movement. Pt reported abdomen tightening and relaxing since fall sometimes intense, then pain radiates into lower back. Pt denied any leaking or bleeding. RN advised for patient to be evaluated at MAU. Pt verbalized understanding.   Silvano LELON Piano, RN

## 2024-05-22 ENCOUNTER — Ambulatory Visit: Admitting: Obstetrics and Gynecology

## 2024-05-22 VITALS — BP 117/75 | HR 88 | Wt 230.1 lb

## 2024-05-22 DIAGNOSIS — O0993 Supervision of high risk pregnancy, unspecified, third trimester: Secondary | ICD-10-CM

## 2024-05-22 DIAGNOSIS — Z23 Encounter for immunization: Secondary | ICD-10-CM | POA: Diagnosis not present

## 2024-05-22 DIAGNOSIS — Z3A30 30 weeks gestation of pregnancy: Secondary | ICD-10-CM

## 2024-05-22 DIAGNOSIS — O10913 Unspecified pre-existing hypertension complicating pregnancy, third trimester: Secondary | ICD-10-CM

## 2024-05-22 DIAGNOSIS — O10919 Unspecified pre-existing hypertension complicating pregnancy, unspecified trimester: Secondary | ICD-10-CM

## 2024-05-22 MED ORDER — FLUOXETINE HCL 20 MG PO CAPS
20.0000 mg | ORAL_CAPSULE | Freq: Every day | ORAL | 1 refills | Status: AC
Start: 2024-05-22 — End: ?
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
## 2024-06-01 ENCOUNTER — Encounter: Payer: Self-pay | Admitting: Family Medicine

## 2024-06-04 ENCOUNTER — Ambulatory Visit (INDEPENDENT_AMBULATORY_CARE_PROVIDER_SITE_OTHER)

## 2024-06-04 ENCOUNTER — Telehealth: Payer: Self-pay

## 2024-06-04 DIAGNOSIS — Z3A31 31 weeks gestation of pregnancy: Secondary | ICD-10-CM

## 2024-06-04 DIAGNOSIS — O10013 Pre-existing essential hypertension complicating pregnancy, third trimester: Secondary | ICD-10-CM

## 2024-06-04 NOTE — Telephone Encounter (Signed)
 RN received babyscripts elevated readings for BP from 9/19-9/21. RN called patient. Pt reported having contractions Friday night very regular, did not go to MAU, drank water , helped, did have elevated blood pressure readings over the weekend. Pt reported having increased pain over the weekend as well thinking that might be the reason it was elevated. Pt reported most recent blood pressure last night was 124/76 at 9:06 pm. RN scheduled patient for a nurse BP check today and requested patient bring cuff and monitor with her to the office, pt agreed.  Silvano LELON Piano, RN

## 2024-06-04 NOTE — Progress Notes (Signed)
 Subjective:  Susan Krause is a 33 y.o. female here for BP check. Pt is 31 weeks 6 days pregnant, had elevated blood pressures over the weekend and a constant headache. Pt reported good fetal movement.   Hypertension ROS: Patient denies any headaches, visual symptoms, RUQ/epigastric pain or other concerning symptoms. Pt reported had previous headache this morning, however drank caffeine  this morning, headache now resolved.   Objective:  Ht 5' 2 (1.575 m)   Wt 238 lb (108 kg)   LMP 10/25/2023 (Exact Date)   BMI 43.53 kg/m   Appearance alert, well appearing, and in no distress. General exam BP noted to be 136/89 today in office. Home office BP cuff and monitor 132/89.   Assessment:   Blood Pressure well controlled and stable.   Plan:  Current treatment plan is effective, no change in therapy. Continue checking blood pressures at home. Reviewed when to notify provider and or go to MAU.   Silvano LELON Piano, RN

## 2024-06-05 ENCOUNTER — Ambulatory Visit: Attending: Maternal & Fetal Medicine | Admitting: Obstetrics

## 2024-06-05 ENCOUNTER — Ambulatory Visit (HOSPITAL_BASED_OUTPATIENT_CLINIC_OR_DEPARTMENT_OTHER)

## 2024-06-05 VITALS — BP 121/83 | HR 91

## 2024-06-05 DIAGNOSIS — O99213 Obesity complicating pregnancy, third trimester: Secondary | ICD-10-CM | POA: Insufficient documentation

## 2024-06-05 DIAGNOSIS — O10919 Unspecified pre-existing hypertension complicating pregnancy, unspecified trimester: Secondary | ICD-10-CM

## 2024-06-05 DIAGNOSIS — Z3A32 32 weeks gestation of pregnancy: Secondary | ICD-10-CM | POA: Diagnosis not present

## 2024-06-05 DIAGNOSIS — O10913 Unspecified pre-existing hypertension complicating pregnancy, third trimester: Secondary | ICD-10-CM | POA: Insufficient documentation

## 2024-06-05 DIAGNOSIS — O99210 Obesity complicating pregnancy, unspecified trimester: Secondary | ICD-10-CM

## 2024-06-05 DIAGNOSIS — Z362 Encounter for other antenatal screening follow-up: Secondary | ICD-10-CM | POA: Insufficient documentation

## 2024-06-05 DIAGNOSIS — O09293 Supervision of pregnancy with other poor reproductive or obstetric history, third trimester: Secondary | ICD-10-CM | POA: Insufficient documentation

## 2024-06-05 DIAGNOSIS — E669 Obesity, unspecified: Secondary | ICD-10-CM | POA: Diagnosis not present

## 2024-06-05 DIAGNOSIS — O10013 Pre-existing essential hypertension complicating pregnancy, third trimester: Secondary | ICD-10-CM | POA: Diagnosis not present

## 2024-06-05 DIAGNOSIS — O34219 Maternal care for unspecified type scar from previous cesarean delivery: Secondary | ICD-10-CM | POA: Diagnosis not present

## 2024-06-05 NOTE — Progress Notes (Signed)
 MFM Consult Note  Susan Krause is currently at 32 weeks and 0 days.  Susan Krause has been followed due to maternal obesity and chronic hypertension treated with labetalol .    Susan Krause denies any problems since her last exam.  Her blood pressure today was 121/83.  Sonographic findings Single intrauterine pregnancy. Fetal cardiac activity: Observed. Presentation: . Interval fetal anatomy appears normal. Fetal biometry shows the estimated fetal weight of 4 pounds 1 ounces which measures at the 34th percentile. Amniotic fluid: Within normal limits.  MVP: 5.1 cm. Placenta: Posterior. BPP: 8/8.   There are limitations of prenatal ultrasound such as the inability to detect certain abnormalities due to poor visualization. Various factors such as fetal position, gestational age and maternal body habitus may increase the difficulty in visualizing the fetal anatomy.    As her blood pressures were difficult to control during her last pregnancy late in the third trimester, Susan Krause already has a repeat C-section scheduled on July 10, 2024 (at 37 weeks).  Preeclampsia precautions were reviewed today.    We will continue to follow her with weekly fetal testing until delivery.    Susan Krause will return in 1 week for a BPP.  The patient stated that all of her questions were answered today.  A total of 20 minutes was spent counseling and coordinating the care for this patient.  Greater than 50% of the time was spent in direct face-to-face contact.
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
## 2024-06-07 ENCOUNTER — Other Ambulatory Visit (HOSPITAL_COMMUNITY)
Admission: RE | Admit: 2024-06-07 | Discharge: 2024-06-07 | Disposition: A | Discharge status: Home / Self Care | Source: Ambulatory Visit | Attending: Obstetrics and Gynecology | Admitting: Obstetrics and Gynecology

## 2024-06-07 ENCOUNTER — Encounter: Payer: Self-pay | Admitting: Obstetrics and Gynecology

## 2024-06-07 ENCOUNTER — Ambulatory Visit (INDEPENDENT_AMBULATORY_CARE_PROVIDER_SITE_OTHER): Admitting: Obstetrics and Gynecology

## 2024-06-07 VITALS — BP 118/81 | HR 98 | Wt 236.0 lb

## 2024-06-07 DIAGNOSIS — N898 Other specified noninflammatory disorders of vagina: Secondary | ICD-10-CM

## 2024-06-07 DIAGNOSIS — F419 Anxiety disorder, unspecified: Secondary | ICD-10-CM

## 2024-06-07 DIAGNOSIS — O0993 Supervision of high risk pregnancy, unspecified, third trimester: Secondary | ICD-10-CM | POA: Insufficient documentation

## 2024-06-07 DIAGNOSIS — Z3A32 32 weeks gestation of pregnancy: Secondary | ICD-10-CM

## 2024-06-07 DIAGNOSIS — Z98891 History of uterine scar from previous surgery: Secondary | ICD-10-CM

## 2024-06-07 DIAGNOSIS — O99210 Obesity complicating pregnancy, unspecified trimester: Secondary | ICD-10-CM

## 2024-06-07 DIAGNOSIS — O09299 Supervision of pregnancy with other poor reproductive or obstetric history, unspecified trimester: Secondary | ICD-10-CM

## 2024-06-07 DIAGNOSIS — O26893 Other specified pregnancy related conditions, third trimester: Secondary | ICD-10-CM | POA: Diagnosis not present

## 2024-06-07 DIAGNOSIS — O10919 Unspecified pre-existing hypertension complicating pregnancy, unspecified trimester: Secondary | ICD-10-CM

## 2024-06-08 LAB — CERVICOVAGINAL ANCILLARY ONLY
Bacterial Vaginitis (gardnerella): NEGATIVE
Candida Glabrata: NEGATIVE
Candida Vaginitis: POSITIVE — AB
Comment: NEGATIVE
Comment: NEGATIVE
Comment: NEGATIVE

## 2024-06-09 ENCOUNTER — Encounter (HOSPITAL_COMMUNITY): Payer: Self-pay | Admitting: Obstetrics and Gynecology

## 2024-06-09 ENCOUNTER — Inpatient Hospital Stay (HOSPITAL_COMMUNITY)
Admission: AD | Admit: 2024-06-09 | Discharge: 2024-06-09 | Disposition: A | Discharge status: Home / Self Care | Attending: Obstetrics and Gynecology | Admitting: Obstetrics and Gynecology

## 2024-06-09 ENCOUNTER — Other Ambulatory Visit: Payer: Self-pay

## 2024-06-09 DIAGNOSIS — O23593 Infection of other part of genital tract in pregnancy, third trimester: Secondary | ICD-10-CM | POA: Diagnosis not present

## 2024-06-09 DIAGNOSIS — O98813 Other maternal infectious and parasitic diseases complicating pregnancy, third trimester: Secondary | ICD-10-CM | POA: Diagnosis not present

## 2024-06-09 DIAGNOSIS — Z3A32 32 weeks gestation of pregnancy: Secondary | ICD-10-CM | POA: Diagnosis not present

## 2024-06-09 DIAGNOSIS — O26899 Other specified pregnancy related conditions, unspecified trimester: Secondary | ICD-10-CM

## 2024-06-09 DIAGNOSIS — O26893 Other specified pregnancy related conditions, third trimester: Secondary | ICD-10-CM | POA: Diagnosis not present

## 2024-06-09 DIAGNOSIS — B3731 Acute candidiasis of vulva and vagina: Secondary | ICD-10-CM | POA: Insufficient documentation

## 2024-06-09 DIAGNOSIS — R109 Unspecified abdominal pain: Secondary | ICD-10-CM | POA: Diagnosis not present

## 2024-06-09 LAB — URINALYSIS, ROUTINE W REFLEX MICROSCOPIC
Bilirubin Urine: NEGATIVE
Glucose, UA: NEGATIVE mg/dL
Hgb urine dipstick: NEGATIVE
Ketones, ur: NEGATIVE mg/dL
Nitrite: NEGATIVE
Protein, ur: NEGATIVE mg/dL
Specific Gravity, Urine: 1.012 (ref 1.005–1.030)
pH: 7 (ref 5.0–8.0)

## 2024-06-09 MED ORDER — TERCONAZOLE 0.4 % VA CREA: 1.0000 | TOPICAL_CREAM | Freq: Every day | VAGINAL | 0 refills | Status: DC

## 2024-06-09 NOTE — MAU Note (Addendum)
 Shaila Gilchrest is a 33 y.o. at [redacted]w[redacted]d here in MAU reporting: mainly left upper abdominal pain that occasionally radiates to the left lower quadrant. She states the pain comes and goes and started at about 1345 today. She is unsure if this is contractions. She describes the pain as sharp and cramping. Patient reports feeling decreased fetal movement. Patient denies having LOF or vaginal bleeding. Patient also reports testing positive for candida vaginitis on 06/07/24, but has not started treatment for it yet.   EDD: 07/31/2024 Onset of complaint: 1345 today Pain score: 5 Vitals:   06/09/24 1622  BP: 131/86  Pulse: (!) 106  Resp: 20  Temp: 99 F (37.2 C)  SpO2: 99%     FHT: 130 Lab orders placed from triage: urinalysis

## 2024-06-09 NOTE — MAU Provider Note (Signed)
 History     CSN: [id number]  Arrival date and time: 06/09/24 1601   Event Date/Time   First Provider Initiated Contact with Patient 06/09/24 1713      Chief Complaint  Patient presents with   Abdominal Pain   Contractions   Decreased Fetal Movement   HPI [name] is a 33 y.o. year old [id number] female at [redacted]w[redacted]d weeks gestation who presents to MAU reporting LT upper abdominal pain that radiates to the LLQ. She describes the pain as intermittent. The pain start at ~ 1345 today; unsure if it is from UCs. She reports DFM. She denies VB or LOF. She reports she saw (+) yeast on a wet prep done on 9/25, but no treatment has been ordered at this time. She wonders if the pain is from that. She denies any urinary problems. She receives [facility] with [facility]; next appt is 06/19/2024. Her partner is present and contributing to the history taking.     OB History     Gravida  7   Para  2   Term  2   Preterm      AB  4   Living  2      SAB  2   IAB      Ectopic  2   Multiple  0   Live Births  2        Obstetric Comments  C/s for fail to descent, pushed for 3 hrs         Past Medical History:  Diagnosis Date   ADD (attention deficit disorder) 07/30/2009   Overview:   Attention-deficit Hyperactivity Disorder     ICD-10 cut over      ADHD (attention deficit hyperactivity disorder)    No meds   Cervical dysplasia    Chlamydia    Complication of anesthesia    woke up during wisdom teeth and had PVCs   Endometriosis 06/19/2020   Head injury with loss of consciousness (HCC) 03/2020   Hypertension    Hypoglycemia    Migraine    Propanolol 10 mg   Migraine with aura and without status migrainosus, not intractable 12/09/2017   Takes labetalol  daily for this, previously on propranolol      OCD (obsessive compulsive disorder)    Ovarian cyst    PTSD (post-traumatic stress disorder)    no meds   PVC's (premature ventricular contractions) 2018   has palpations  when she has PV's.   Sinus tachycardia    UTI (urinary tract infection)     Past Surgical History:  Procedure Laterality Date   CESAREAN SECTION N/A 07/18/2018   Procedure: CESAREAN SECTION;  Surgeon: Bettina Muskrat, MD;  Location: [facility] BIRTHING SU[facility];  Service: Obstetrics;  Laterality: N/A;   CESAREAN SECTION N/A 03/07/2023   Procedure: CESAREAN SECTION;  Surgeon: Lola Donnice HERO, MD;  Location: [facility] LD ORS;  Service: Obstetrics;  Laterality: N/A;   DIAGNOSTIC LAPAROSCOPY WITH REMOVAL OF ECTOPIC PREGNANCY Left 12/20/2020   Procedure: LAPAROSCOPIC LEFT SALPINGECTOMY WITH REMOVAL OF ECTOPIC PREGNANCY;  Surgeon: Fredirick Glenys RAMAN, MD;  Location: [facility] OR;  Service: Gynecology;  Laterality: Left;   DILATION AND CURETTAGE OF UTERUS N/A 07/01/2021   Procedure: DILATATION AND CURETTAGE WITH PATHOLOGY;  Surgeon: Cleatus Moccasin, MD;  Location: [facility] OR;  Service: Gynecology;  Laterality: N/A;   SALPINGECTOMY Left 12/2020   WISDOM TOOTH EXTRACTION      Family History  Problem Relation Age of Onset   Diabetes Mother    Cancer  Mother        stage 2 breast   ADD / ADHD Mother    Anxiety disorder Mother    Depression Mother    Hyperlipidemia Mother    Obesity Mother    Depression Father    ADD / ADHD Father    Anxiety disorder Sister    Asthma Sister    Depression Sister    ADD / ADHD Sister    Intellectual disability Brother    Learning disabilities Brother    Anxiety disorder Maternal Aunt    Diabetes Maternal Aunt    Alcohol abuse Maternal Uncle    Hyperlipidemia Maternal Uncle    Anxiety disorder Paternal Uncle    Diabetes Paternal Uncle    Anxiety disorder Maternal Grandmother    Diabetes Maternal Grandmother    Hyperlipidemia Maternal Grandmother    Varicose Veins Maternal Grandmother    Cancer Maternal Grandfather    Hyperlipidemia Maternal Grandfather    Obesity Maternal Grandfather    Vision loss Maternal Grandfather    Cancer Paternal Grandmother     Social History   Tobacco  Use   Smoking status: Never   Smokeless tobacco: Never  Vaping Use   Vaping status: Never Used  Substance Use Topics   Alcohol use: No   Drug use: No    Allergies:  Allergies  Allergen Reactions   Banana Swelling   Latex Swelling   Peanut-Containing Drug Products Anaphylaxis   Hpv Bival (Type 16,18) Recomb Vaccine  [Human Papillomavirus 2-Valent Recombinant Vaccine] Hives   Iodinated Contrast Media Diarrhea, Palpitations and Other (See Comments)   Pumpkin Flavoring Agent (Non-Screening) Swelling   Lactose Intolerance (Gi) Other (See Comments)    Gi upset    Medications Prior to Admission  Medication Sig Dispense Refill Last Dose/Taking   aspirin  EC 81 MG tablet Take 2 tablets (162 mg total) by mouth daily. Swallow whole. Start at 12-14 weeks 60 tablet 12 06/08/2024   famotidine  (PEPCID ) 20 MG tablet Take 1 tablet (20 mg total) by mouth 2 (two) times daily. 180 tablet 3 06/08/2024   FLUoxetine  (PROZAC ) 20 MG capsule Take 1 capsule (20 mg total) by mouth daily. 90 capsule 1 06/08/2024   labetalol  (NORMODYNE ) 100 MG tablet Take 1 tablet (100 mg total) by mouth 2 (two) times daily. (Patient taking differently: Take 100 mg by mouth daily.) 60 tablet 2 06/08/2024   Prenatal Multivit-Min-Fe-FA (PRENATAL 1 + IRON PO)    06/08/2024   acetaminophen  (TYLENOL ) 500 MG tablet Take 2 tablets (1,000 mg total) by mouth every 6 (six) hours as needed. 100 tablet 0 Unknown    Review of Systems  Constitutional: Negative.   HENT: Negative.    Eyes: Negative.   Respiratory: Negative.    Cardiovascular: Negative.   Gastrointestinal: Negative.   Endocrine: Negative.   Genitourinary: Negative.   Musculoskeletal: Negative.   Skin: Negative.   Allergic/Immunologic: Negative.   Neurological: Negative.   Hematological: Negative.   Psychiatric/Behavioral: Negative.     Physical Exam   Blood pressure 124/82, pulse (!) 109, temperature 99 F (37.2 C), temperature source Oral, resp. rate 18, height 5'  2 (1.575 m), weight 108.2 kg, last menstrual period 10/25/2023, SpO2 97%, currently breastfeeding.  Physical Exam Constitutional:      Appearance: Normal appearance. She is obese.  Cardiovascular:     Rate and Rhythm: Tachycardia present.  Pulmonary:     Effort: Pulmonary effort is normal.  Abdominal:     Palpations: Abdomen is soft.  Genitourinary:    Comments: Dilation: Closed Effacement (%): Thick Cervical Position: Posterior Station: Ballotable Presentation: Undeterminable Exam by: FABIENE Cart, CNM  Musculoskeletal:        General: Normal range of motion.  Skin:    General: Skin is warm and dry.  Neurological:     Mental Status: She is alert and oriented to person, place, and time.  Psychiatric:        Mood and Affect: Mood normal.        Behavior: Behavior normal.        Thought Content: Thought content normal.        Judgment: Judgment normal.    REACTIVE NST - FHR: 135 bpm / moderate variability / accels present / decels absent / TOCO: UI noted MAU Course  Procedures  MDM [facility] SVE  Results for orders placed or performed during the hospital encounter of 06/09/24 (from the past 24 hours)  Urinalysis, Routine w reflex microscopic -Urine, Clean Catch     Status: Abnormal   Collection Time: 06/09/24  4:30 PM  Result Value Ref Range   Color, Urine YELLOW YELLOW   APPearance HAZY (A) CLEAR   Specific Gravity, Urine 1.012 1.005 - 1.030   pH 7.0 5.0 - 8.0   Glucose, UA NEGATIVE NEGATIVE mg/dL   Hgb urine dipstick NEGATIVE NEGATIVE   Bilirubin Urine NEGATIVE NEGATIVE   Ketones, ur NEGATIVE NEGATIVE mg/dL   Protein, ur NEGATIVE NEGATIVE mg/dL   Nitrite NEGATIVE NEGATIVE   Leukocytes,Ua MODERATE (A) NEGATIVE   RBC / HPF 0-5 0 - 5 RBC/hpf   WBC, UA 6-10 0 - 5 WBC/hpf   Bacteria, UA MANY (A) NONE SEEN   Squamous Epithelial / HPF 6-10 0 - 5 /HPF   Mucus PRESENT     Assessment and Plan  1. Abdominal pain affecting pregnancy (Primary) - Information provided on  abdominal pain in pregnancy   2. Candida vaginitis - Information provided on vaginal yeast infection - Prescription for: Terazol 0.4%  cream pv x 3 days; can use up to 7 days  3. [redacted] weeks gestation of pregnancy   - Discharge home - Keep scheduled appt with [facility] on 9/30 and [facility] on 10/7 - Patient verbalized an understanding of the plan of care and agrees.   Ala Cart, CNM 06/09/2024, 5:14 PM

## 2024-06-10 ENCOUNTER — Ambulatory Visit: Payer: Self-pay | Admitting: Obstetrics and Gynecology

## 2024-06-10 ENCOUNTER — Encounter: Payer: Self-pay | Admitting: Obstetrics and Gynecology

## 2024-06-10 DIAGNOSIS — O2343 Unspecified infection of urinary tract in pregnancy, third trimester: Secondary | ICD-10-CM

## 2024-06-10 MED ORDER — CEFADROXIL 500 MG PO CAPS: 500.0000 mg | ORAL_CAPSULE | Freq: Two times a day (BID) | ORAL | 0 refills | Status: AC

## 2024-06-11 ENCOUNTER — Ambulatory Visit: Payer: Self-pay | Admitting: Obstetrics and Gynecology

## 2024-06-11 LAB — CULTURE, OB URINE: Culture: 20000 — AB

## 2024-06-12 ENCOUNTER — Ambulatory Visit (HOSPITAL_BASED_OUTPATIENT_CLINIC_OR_DEPARTMENT_OTHER)

## 2024-06-12 ENCOUNTER — Ambulatory Visit: Attending: Obstetrics | Admitting: Obstetrics

## 2024-06-12 VITALS — BP 132/77

## 2024-06-12 DIAGNOSIS — O10013 Pre-existing essential hypertension complicating pregnancy, third trimester: Secondary | ICD-10-CM

## 2024-06-12 DIAGNOSIS — Z79899 Other long term (current) drug therapy: Secondary | ICD-10-CM | POA: Diagnosis not present

## 2024-06-12 DIAGNOSIS — Z3A33 33 weeks gestation of pregnancy: Secondary | ICD-10-CM | POA: Diagnosis not present

## 2024-06-12 DIAGNOSIS — O34219 Maternal care for unspecified type scar from previous cesarean delivery: Secondary | ICD-10-CM | POA: Diagnosis not present

## 2024-06-12 DIAGNOSIS — O09293 Supervision of pregnancy with other poor reproductive or obstetric history, third trimester: Secondary | ICD-10-CM

## 2024-06-12 DIAGNOSIS — O99213 Obesity complicating pregnancy, third trimester: Secondary | ICD-10-CM | POA: Insufficient documentation

## 2024-06-12 DIAGNOSIS — Z3483 Encounter for supervision of other normal pregnancy, third trimester: Secondary | ICD-10-CM

## 2024-06-12 DIAGNOSIS — O10913 Unspecified pre-existing hypertension complicating pregnancy, third trimester: Secondary | ICD-10-CM

## 2024-06-12 NOTE — Progress Notes (Signed)
 MFM Note  Susan Krause is currently at 33 weeks and 0 days.  She has been followed due to maternal obesity and chronic hypertension treated with labetalol .    She denies any problems since her last exam.  Her blood pressure today was 132/77.  A biophysical profile performed today was 8/8.   The AFI was 13.63 cm.  The fetus was in the vertex presentation.  As her blood pressures were difficult to control during her last pregnancy late in the third trimester, she already has a repeat C-section scheduled on July 10, 2024 (at 37 weeks).  The patient will have her NSTs performed in the [place] office for the next 2 weeks.    She will return to our office in 3 weeks for another BPP and growth scan.

## 2024-06-18 ENCOUNTER — Telehealth: Payer: Self-pay

## 2024-06-18 ENCOUNTER — Ambulatory Visit

## 2024-06-18 VITALS — BP 126/86 | HR 96 | Wt 240.0 lb

## 2024-06-18 DIAGNOSIS — O368130 Decreased fetal movements, third trimester, not applicable or unspecified: Secondary | ICD-10-CM

## 2024-06-18 DIAGNOSIS — Z3A33 33 weeks gestation of pregnancy: Secondary | ICD-10-CM

## 2024-06-18 DIAGNOSIS — O0993 Supervision of high risk pregnancy, unspecified, third trimester: Secondary | ICD-10-CM

## 2024-06-18 NOTE — Progress Notes (Signed)
 Pt reported decreased fetal movement over the weekend, concerned, feels like movement less prominent, wanted to get evaluated. Pt denied any vaginal bleeding, leaking or contractions. VS WNL. NST reviewed by Dr. Cris, NST reactive and reassuring for gestational age.   Silvano LELON Piano, RN

## 2024-06-18 NOTE — Telephone Encounter (Signed)
 Pt called into office with decreased fetal movement since over the weekend. Pt reported tried kick counts, feels like movement is less prominent. Pt reported felt one very small movement this morning, definitely a change. RN scheduled patient for NST today at 9:30am.  Silvano LELON Piano, RN

## 2024-06-19 ENCOUNTER — Ambulatory Visit: Admitting: Obstetrics and Gynecology

## 2024-06-19 VITALS — BP 139/95 | HR 109 | Wt 238.0 lb

## 2024-06-19 DIAGNOSIS — Z3A34 34 weeks gestation of pregnancy: Secondary | ICD-10-CM

## 2024-06-19 DIAGNOSIS — O10919 Unspecified pre-existing hypertension complicating pregnancy, unspecified trimester: Secondary | ICD-10-CM

## 2024-06-19 DIAGNOSIS — O09299 Supervision of pregnancy with other poor reproductive or obstetric history, unspecified trimester: Secondary | ICD-10-CM | POA: Diagnosis not present

## 2024-06-19 DIAGNOSIS — O0993 Supervision of high risk pregnancy, unspecified, third trimester: Secondary | ICD-10-CM | POA: Diagnosis not present

## 2024-06-19 DIAGNOSIS — Z23 Encounter for immunization: Secondary | ICD-10-CM
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
## 2024-06-20 LAB — CBC WITH DIFFERENTIAL/PLATELET
Basophils Absolute: 0 x10E3/uL (ref 0.0–0.2)
Basos: 0 %
EOS (ABSOLUTE): 0.1 x10E3/uL (ref 0.0–0.4)
Eos: 1 %
Hematocrit: 36.4 % (ref 34.0–46.6)
Hemoglobin: 11.7 g/dL (ref 11.1–15.9)
Immature Grans (Abs): 0.1 x10E3/uL (ref 0.0–0.1)
Immature Granulocytes: 1 %
Lymphocytes Absolute: 1.7 x10E3/uL (ref 0.7–3.1)
Lymphs: 18 %
MCH: 28.5 pg (ref 26.6–33.0)
MCHC: 32.1 g/dL (ref 31.5–35.7)
MCV: 89 fL (ref 79–97)
Monocytes Absolute: 0.5 x10E3/uL (ref 0.1–0.9)
Monocytes: 6 %
Neutrophils Absolute: 7.2 x10E3/uL — ABNORMAL HIGH (ref 1.4–7.0)
Neutrophils: 74 %
Platelets: 271 x10E3/uL (ref 150–450)
RBC: 4.11 x10E6/uL (ref 3.77–5.28)
RDW: 13.2 % (ref 11.7–15.4)
WBC: 9.6 x10E3/uL (ref 3.4–10.8)

## 2024-06-20 LAB — COMPREHENSIVE METABOLIC PANEL WITH GFR
ALT: 16 IU/L (ref 0–32)
AST: 17 IU/L (ref 0–40)
Albumin: 3.7 g/dL — ABNORMAL LOW (ref 4.0–5.0)
Alkaline Phosphatase: 107 IU/L (ref 41–116)
BUN/Creatinine Ratio: 15 (ref 9–23)
BUN: 6 mg/dL (ref 6–20)
Bilirubin Total: 0.2 mg/dL (ref 0.0–1.2)
CO2: 19 mmol/L — ABNORMAL LOW (ref 20–29)
Calcium: 9.1 mg/dL (ref 8.7–10.2)
Chloride: 103 mmol/L (ref 96–106)
Creatinine, Ser: 0.41 mg/dL — ABNORMAL LOW (ref 0.57–1.00)
Globulin, Total: 2.6 g/dL (ref 1.5–4.5)
Glucose: 67 mg/dL — ABNORMAL LOW (ref 70–99)
Potassium: 4.3 mmol/L (ref 3.5–5.2)
Sodium: 134 mmol/L (ref 134–144)
Total Protein: 6.3 g/dL (ref 6.0–8.5)
eGFR: 136 mL/min/1.73 (ref >59)

## 2024-06-20 LAB — PROTEIN / CREATININE RATIO, URINE
Creatinine, Urine: 61.2 mg/dL
Protein, Ur: 26.1 mg/dL
Protein/Creat Ratio: 426 mg/g{creat} — ABNORMAL HIGH (ref 0–200)

## 2024-06-21 ENCOUNTER — Ambulatory Visit (INDEPENDENT_AMBULATORY_CARE_PROVIDER_SITE_OTHER)

## 2024-06-21 ENCOUNTER — Ambulatory Visit

## 2024-06-21 VITALS — BP 129/82 | HR 101 | Wt 241.0 lb

## 2024-06-21 DIAGNOSIS — O10013 Pre-existing essential hypertension complicating pregnancy, third trimester: Secondary | ICD-10-CM

## 2024-06-21 DIAGNOSIS — O99210 Obesity complicating pregnancy, unspecified trimester: Secondary | ICD-10-CM

## 2024-06-21 DIAGNOSIS — Z3A33 33 weeks gestation of pregnancy: Secondary | ICD-10-CM | POA: Diagnosis not present

## 2024-06-21 DIAGNOSIS — O10919 Unspecified pre-existing hypertension complicating pregnancy, unspecified trimester: Secondary | ICD-10-CM

## 2024-06-21 DIAGNOSIS — O0993 Supervision of high risk pregnancy, unspecified, third trimester: Secondary | ICD-10-CM

## 2024-06-21 NOTE — Progress Notes (Signed)
 Pt here for NST, VS WNL. Pt reported good fetal movement and denied any signs of preterm labor. Patient requested to speak with provider regarding recent lab results. NST reviewed by Nidia Daring, FNP, NST reactive and reassuring for gestational age.     Silvano LELON Piano, RN

## 2024-06-22 ENCOUNTER — Telehealth: Payer: Self-pay

## 2024-06-22 ENCOUNTER — Encounter (HOSPITAL_COMMUNITY): Payer: Self-pay | Admitting: Obstetrics and Gynecology

## 2024-06-22 ENCOUNTER — Inpatient Hospital Stay (HOSPITAL_COMMUNITY)
Admission: AD | Admit: 2024-06-22 | Discharge: 2024-06-22 | Disposition: A | Discharge status: Home / Self Care | Attending: Obstetrics and Gynecology | Admitting: Obstetrics and Gynecology

## 2024-06-22 DIAGNOSIS — Z79899 Other long term (current) drug therapy: Secondary | ICD-10-CM | POA: Insufficient documentation

## 2024-06-22 DIAGNOSIS — O119 Pre-existing hypertension with pre-eclampsia, unspecified trimester: Secondary | ICD-10-CM | POA: Insufficient documentation

## 2024-06-22 DIAGNOSIS — O113 Pre-existing hypertension with pre-eclampsia, third trimester: Secondary | ICD-10-CM

## 2024-06-22 DIAGNOSIS — O34219 Maternal care for unspecified type scar from previous cesarean delivery: Secondary | ICD-10-CM | POA: Insufficient documentation

## 2024-06-22 DIAGNOSIS — Z3A34 34 weeks gestation of pregnancy: Secondary | ICD-10-CM | POA: Diagnosis not present

## 2024-06-22 DIAGNOSIS — Z3689 Encounter for other specified antenatal screening: Secondary | ICD-10-CM

## 2024-06-22 HISTORY — DX: Pre-existing hypertension with pre-eclampsia, unspecified trimester: O11.9

## 2024-06-22 LAB — CBC
HCT: 34.5 % — ABNORMAL LOW (ref 36.0–46.0)
Hemoglobin: 11.5 g/dL — ABNORMAL LOW (ref 12.0–15.0)
MCH: 28.3 pg (ref 26.0–34.0)
MCHC: 33.3 g/dL (ref 30.0–36.0)
MCV: 84.8 fL (ref 80.0–100.0)
Platelets: 250 K/uL (ref 150–400)
RBC: 4.07 MIL/uL (ref 3.87–5.11)
RDW: 13.9 % (ref 11.5–15.5)
WBC: 9.2 K/uL (ref 4.0–10.5)
nRBC: 0 % (ref 0.0–0.2)

## 2024-06-22 LAB — COMPREHENSIVE METABOLIC PANEL WITH GFR
ALT: 16 U/L (ref 0–44)
AST: 15 U/L (ref 15–41)
Albumin: 2.6 g/dL — ABNORMAL LOW (ref 3.5–5.0)
Alkaline Phosphatase: 84 U/L (ref 38–126)
Anion gap: 10 (ref 5–15)
BUN: <5 mg/dL — ABNORMAL LOW (ref 6–20)
CO2: 20 mmol/L — ABNORMAL LOW (ref 22–32)
Calcium: 8.7 mg/dL — ABNORMAL LOW (ref 8.9–10.3)
Chloride: 104 mmol/L (ref 98–111)
Creatinine, Ser: 0.4 mg/dL — ABNORMAL LOW (ref 0.44–1.00)
GFR, Estimated: >60 mL/min (ref >60)
Glucose, Bld: 69 mg/dL — ABNORMAL LOW (ref 70–99)
Potassium: 3.8 mmol/L (ref 3.5–5.1)
Sodium: 134 mmol/L — ABNORMAL LOW (ref 135–145)
Total Bilirubin: 0.2 mg/dL (ref 0.0–1.2)
Total Protein: 6.1 g/dL — ABNORMAL LOW (ref 6.5–8.1)

## 2024-06-22 LAB — PROTEIN / CREATININE RATIO, URINE
Creatinine, Urine: 73 mg/dL
Protein Creatinine Ratio: 0.1 mg/mg{creat} (ref 0.00–0.15)
Total Protein, Urine: 7 mg/dL

## 2024-06-22 MED ORDER — METOCLOPRAMIDE HCL 5 MG/ML IJ SOLN
10.0000 mg | Freq: Once | INTRAMUSCULAR | Status: DC
  Filled 2024-06-22: qty 2

## 2024-06-22 MED ORDER — ACETAMINOPHEN-CAFFEINE 500-65 MG PO TABS
2.0000 | ORAL_TABLET | Freq: Once | ORAL | Status: AC
  Administered 2024-06-22: 2 via ORAL
  Filled 2024-06-22: qty 2

## 2024-06-22 MED ORDER — DIPHENHYDRAMINE HCL 50 MG/ML IJ SOLN
25.0000 mg | Freq: Once | INTRAMUSCULAR | Status: DC
  Filled 2024-06-22: qty 1

## 2024-06-22 MED ORDER — LACTATED RINGERS IV BOLUS: 1000.0000 mL | Freq: Once | INTRAVENOUS | Status: DC

## 2024-06-22 NOTE — MAU Provider Note (Addendum)
 Chief Complaint:  Hypertension   None     HPI: Susan Krause is a 33 y.o. [id number] at [redacted]w[redacted]d who presents to maternity admissions reporting severe range BP and HA this AM. Yesterday she was diagnosed w/ pre eclampsia due to elevated BP and an abnormal UPCr test in the office. Patient is a known cHTN on ASA, and labetalol  100 mg BID    She was instructed to monitor her BP closely at home and provided w/ pre eclampsia symptom education. This AM after taking a shower she developed a HA that started as a throbbing in the back of her head and progressed to be diffuse over her whole head. She hasn't tried any medicines to help with the HA so far. Her BP was measured at > 160 systolic while on the phone w/ the RN from her office, and was instructed to come in for evaluation.   She denies RUQ pain, vision change, swelling, dyspnea, chest pain or oliguria. Denies feeling contractions.  She reports good fetal movement, denies LOF, vaginal bleeding, contractions, vaginal itching/burning, urinary symptoms, n/v, or fever/chills.    Both of her prior successful pregnancies were C sections due to severe range pressures. Has a C section scheduled at 37 weeks.   Past Medical History: Past Medical History:  Diagnosis Date   ADD (attention deficit disorder) 07/30/2009   Overview:   Attention-deficit Hyperactivity Disorder     ICD-10 cut over      ADHD (attention deficit hyperactivity disorder)    No meds   Cervical dysplasia    Chlamydia    Complication of anesthesia    woke up during wisdom teeth and had PVCs   Endometriosis 06/19/2020   Head injury with loss of consciousness (HCC) 03/2020   Hypertension    Hypoglycemia    Migraine    Propanolol 10 mg   Migraine with aura and without status migrainosus, not intractable 12/09/2017   Takes labetalol  daily for this, previously on propranolol      OCD (obsessive compulsive disorder)    Ovarian cyst    PTSD (post-traumatic stress disorder)    no meds    PVC's (premature ventricular contractions) 2018   has palpations when she has PV's.   Sinus tachycardia    UTI (urinary tract infection)     Past obstetric history: OB History  Gravida Para Term Preterm AB Living  7 2 2  4 2   SAB IAB Ectopic Multiple Live Births  2  2 0 2    # Outcome Date GA Lbr Len/2nd Weight Sex Type Anes PTL Lv  7 Current           6 Term 03/07/23 [redacted]w[redacted]d  3440 g F CS-LTranv Spinal  LIV  5 SAB 12/2021          4 Ectopic 06/2021          3 SAB 05/2021          2 Ectopic 12/20/20 [redacted]w[redacted]d         1 Term 07/18/18 [redacted]w[redacted]d / 04:17 3715 g M CS-Vac EPI  LIV    Obstetric Comments  C/s for fail to descent, pushed for 3 hrs    Past Surgical History: Past Surgical History:  Procedure Laterality Date   CESAREAN SECTION N/A 07/18/2018   Procedure: CESAREAN SECTION;  Surgeon: Bettina Muskrat, MD;  Location: [facility] BIRTHING SUITES;  Service: Obstetrics;  Laterality: N/A;   CESAREAN SECTION N/A 03/07/2023   Procedure: CESAREAN SECTION;  Surgeon: Lola Donnice HERO,  MD;  Location: [facility] LD ORS;  Service: Obstetrics;  Laterality: N/A;   DIAGNOSTIC LAPAROSCOPY WITH REMOVAL OF ECTOPIC PREGNANCY Left 12/20/2020   Procedure: LAPAROSCOPIC LEFT SALPINGECTOMY WITH REMOVAL OF ECTOPIC PREGNANCY;  Surgeon: Fredirick Glenys RAMAN, MD;  Location: [facility] OR;  Service: Gynecology;  Laterality: Left;   DILATION AND CURETTAGE OF UTERUS N/A 07/01/2021   Procedure: DILATATION AND CURETTAGE WITH PATHOLOGY;  Surgeon: Cleatus Moccasin, MD;  Location: [facility] OR;  Service: Gynecology;  Laterality: N/A;   SALPINGECTOMY Left 12/2020   WISDOM TOOTH EXTRACTION      Family History: Family History  Problem Relation Age of Onset   Diabetes Mother    Cancer Mother        stage 2 breast   ADD / ADHD Mother    Anxiety disorder Mother    Depression Mother    Hyperlipidemia Mother    Obesity Mother    Depression Father    ADD / ADHD Father    Anxiety disorder Sister    Asthma Sister    Depression Sister    ADD / ADHD Sister     Intellectual disability Brother    Learning disabilities Brother    Anxiety disorder Maternal Aunt    Diabetes Maternal Aunt    Alcohol abuse Maternal Uncle    Hyperlipidemia Maternal Uncle    Anxiety disorder Paternal Uncle    Diabetes Paternal Uncle    Anxiety disorder Maternal Grandmother    Diabetes Maternal Grandmother    Hyperlipidemia Maternal Grandmother    Varicose Veins Maternal Grandmother    Cancer Maternal Grandfather    Hyperlipidemia Maternal Grandfather    Obesity Maternal Grandfather    Vision loss Maternal Grandfather    Cancer Paternal Grandmother     Social History: Social History   Tobacco Use   Smoking status: Never   Smokeless tobacco: Never  Vaping Use   Vaping status: Never Used  Substance Use Topics   Alcohol use: No   Drug use: No    Allergies:  Allergies  Allergen Reactions   Banana Swelling   Latex Swelling   Peanut-Containing Drug Products Anaphylaxis   Hpv Bival (Type 16,18) Recomb Vaccine  [Human Papillomavirus 2-Valent Recombinant Vaccine] Hives   Iodinated Contrast Media Diarrhea, Palpitations and Other (See Comments)   Pumpkin Flavoring Agent (Non-Screening) Swelling   Lactose Intolerance (Gi) Other (See Comments)    Gi upset    Meds:  Medications Prior to Admission  Medication Sig Dispense Refill Last Dose/Taking   aspirin  EC 81 MG tablet Take 2 tablets (162 mg total) by mouth daily. Swallow whole. Start at 12-14 weeks 60 tablet 12 06/21/2024   famotidine  (PEPCID ) 20 MG tablet Take 1 tablet (20 mg total) by mouth 2 (two) times daily. 180 tablet 3 06/21/2024   FLUoxetine  (PROZAC ) 20 MG capsule Take 1 capsule (20 mg total) by mouth daily. 90 capsule 1 06/21/2024   labetalol  (NORMODYNE ) 100 MG tablet Take 1 tablet (100 mg total) by mouth 2 (two) times daily. (Patient taking differently: Take 100 mg by mouth 2 (two) times daily. Only takes at night.) 60 tablet 2 06/21/2024   Prenatal Multivit-Min-Fe-FA (PRENATAL 1 + IRON PO)    06/21/2024    acetaminophen  (TYLENOL ) 500 MG tablet Take 2 tablets (1,000 mg total) by mouth every 6 (six) hours as needed. (Patient not taking: Reported on 06/21/2024) 100 tablet 0    terconazole  (TERAZOL 7 ) 0.4 % vaginal cream Place 1 applicator vaginally at bedtime. Use for seven days (Patient  not taking: Reported on 06/21/2024) 45 g 0     ROS:  ROS is negative unless otherwise noted in HPI   I have reviewed patient's Past Medical Hx, Surgical Hx, Family Hx, Social Hx, medications and allergies.   Physical Exam  Patient Vitals for the past 24 hrs:  BP Temp Pulse Resp SpO2 Height Weight  06/22/24 1604 -- -- -- -- 98 % -- --  06/22/24 1600 128/77 -- 90 -- 97 % -- --  06/22/24 1530 134/87 -- 91 -- 99 % -- --  06/22/24 1515 139/87 -- 88 -- 99 % -- --  06/22/24 1500 120/70 -- 96 -- 98 % -- --  06/22/24 1454 -- -- -- -- 97 % -- --  06/22/24 1445 126/78 -- 95 -- 97 % -- --  06/22/24 1439 124/81 -- 97 -- -- -- --  06/22/24 1415 137/88 -- 100 -- 98 % -- --  06/22/24 1410 -- -- -- -- 99 % -- --  06/22/24 1409 132/88 -- 100 -- -- -- --  06/22/24 1343 (!) 141/92 98.4 F (36.9 C) (!) 104 18 -- 5' 2 (1.575 m) 108.9 kg   Constitutional: Well-developed, well-nourished female in no acute distress.  Cardiovascular: normal rate Respiratory: normal effort GI: Abd soft, non-tender, gravid appropriate for gestational age.  MS: Extremities nontender, no edema, normal ROM Neurologic: Alert and oriented x 4.  GU: Neg CVAT.    FHT:  Baseline 130 , moderate variability, accelerations present, no decelerations Contractions: moderate uterine irritability w/o regular contractions   Labs: Results for orders placed or performed during the hospital encounter of 06/22/24 (from the past 24 hours)  Protein / creatinine ratio, urine     Status: None   Collection Time: 06/22/24  2:09 PM  Result Value Ref Range   Creatinine, Urine 73 mg/dL   Total Protein, Urine 7 mg/dL   Protein Creatinine Ratio 0.10 0.00 - 0.15  mg/mg[Cre]  CBC     Status: Abnormal   Collection Time: 06/22/24  2:18 PM  Result Value Ref Range   WBC 9.2 4.0 - 10.5 K/uL   RBC 4.07 3.87 - 5.11 MIL/uL   Hemoglobin 11.5 (L) 12.0 - 15.0 g/dL   HCT 65.4 (L) 63.9 - 53.9 %   MCV 84.8 80.0 - 100.0 fL   MCH 28.3 26.0 - 34.0 pg   MCHC 33.3 30.0 - 36.0 g/dL   RDW 86.0 88.4 - 84.4 %   Platelets 250 150 - 400 K/uL   nRBC 0.0 0.0 - 0.2 %  Comprehensive metabolic panel     Status: Abnormal   Collection Time: 06/22/24  2:18 PM  Result Value Ref Range   Sodium 134 (L) 135 - 145 mmol/L   Potassium 3.8 3.5 - 5.1 mmol/L   Chloride 104 98 - 111 mmol/L   CO2 20 (L) 22 - 32 mmol/L   Glucose, Bld 69 (L) 70 - 99 mg/dL   BUN <5 (L) 6 - 20 mg/dL   Creatinine, Ser 9.59 (L) 0.44 - 1.00 mg/dL   Calcium  8.7 (L) 8.9 - 10.3 mg/dL   Total Protein 6.1 (L) 6.5 - 8.1 g/dL   Albumin 2.6 (L) 3.5 - 5.0 g/dL   AST 15 15 - 41 U/L   ALT 16 0 - 44 U/L   Alkaline Phosphatase 84 38 - 126 U/L   Total Bilirubin 0.2 0.0 - 1.2 mg/dL   GFR, Estimated >39 >39 mL/min   Anion gap 10 5 - 15  O/Positive/-- (05/01 1633)  Imaging:  US  MFM FETAL BPP WO NON STRESS Result Date: 06/12/2024 ----------------------------------------------------------------------  OBSTETRICS REPORT                       (Signed Final 06/12/2024 10:18 pm) ---------------------------------------------------------------------- Patient Info  ID #:       [id number]                          D.O.B.:  09-Jan-1991 (32 yrs)(F)  Name:       Delsy Roehm                       Visit Date: 06/12/2024 01:24 pm ---------------------------------------------------------------------- Performed By  Attending:        Steffan Keys MD         Ref. Address:      [address]                                                              [place], [place]                                                              [address]  Performed By:     Mardy Heller          [facility]  Referred By:      [facility] ---------------------------------------------------------------------- Orders  #  Description                           Code        Ordered By  1  US  MFM FETAL BPP WO NON               [phone]  BABARA KEYS     STRESS ----------------------------------------------------------------------  #  Order #                     Accession #                Episode #  1  [id number]                   [id number]                 [id number] ---------------------------------------------------------------------- Indications  Hypertension - Chronic/Pre-existing             O10.019  (Labetalol )  Obesity complicating pregnancy, third           O99.213  trimester (BMI 36)  [redacted] weeks gestation of pregnancy                 Z3A.33  Poor obstetric history: Previous preeclampsia   O09.299  Previous cesarean delivery, antepartum          O34.219  (failed to descend)  LR female ---------------------------------------------------------------------- Fetal Evaluation  Num Of Fetuses:          1  Fetal Heart Rate(bpm):   138  Cardiac Activity:        Observed  Presentation:            Cephalic  Placenta:                Posterior  P. Cord Insertion:       Visualized  Amniotic Fluid  AFI FV:      Within normal limits  AFI Sum(cm)     %Tile       Largest Pocket(cm)  13.63           45          4.17  RUQ(cm)       RLQ(cm)       LUQ(cm)        LLQ(cm)  3.06          4.17          3.11           3.29 ---------------------------------------------------------------------- Biophysical Evaluation  Amniotic F.V:   Within normal limits       F. Tone:         Observed  F. Movement:    Observed                   Score:           8/8  F. Breathing:   Observed ---------------------------------------------------------------------- OB  History  Blood Type:   O+  Gravidity:    7         Term:   2  Living:       2 ---------------------------------------------------------------------- Gestational Age  LMP:           33w 0d        Date:  10/25/23                  EDD:   07/31/24  Best:          33w 0d     Det. By:  LMP  (10/25/23)          EDD:   07/31/24 ---------------------------------------------------------------------- Anatomy  Diaphragm:             Appears normal         Abdomen:  Appears normal  Stomach:               Appears normal, left   Bladder:                Appears normal                         sided ---------------------------------------------------------------------- Comments  Jeoffrey Garrison is currently at 33 weeks and 0 days.  She has  been followed due to maternal obesity and chronic  hypertension treated with labetalol .  She denies any problems since her last exam.  Her blood  pressure today was 132/77.  A biophysical profile performed today was 8/8  The AFI was 13.63 cm.  The fetus was in the vertex presentation.  As her blood pressures were difficult to control during her last  pregnancy late in the third trimester, she already has a repeat  C-section scheduled on July 10, 2024 (at 37 weeks).  The patient will have her NSTs performed in the [place]  office for the next 2 weeks.  She will return to our office in 3 weeks for another BPP and  growth scan. ----------------------------------------------------------------------                   Steffan Keys, MD Electronically Signed Final Report   06/12/2024 10:18 pm ----------------------------------------------------------------------   US  MFM FETAL BPP WO NON STRESS Result Date: 06/05/2024 ----------------------------------------------------------------------  OBSTETRICS REPORT                       (Signed Final 06/05/2024 12:50 pm) ---------------------------------------------------------------------- Patient Info  ID #:       [id number]                           D.O.B.:  09-12-1991 (32 yrs)(F)  Name:       JEOFFREY Newsome                       Visit Date: 06/05/2024 11:15 am ---------------------------------------------------------------------- Performed By  Attending:        Steffan Keys MD         Ref. Address:     [address]                                                             [place], [place]                                                             [address]  Performed By:     Comer Harrow       Location:         [facility]                                     [facility]  at                                                             [facility]                                                             Women  Referred By:      [facility] ---------------------------------------------------------------------- Orders  #  Description                           Code        Ordered By  1  US  MFM FETAL BPP WO NON               76819.01    BURK SCHAIBLE     STRESS  2  US  MFM OB FOLLOW UP                   [id number]    [facility] ----------------------------------------------------------------------  #  Order #                     Accession #                Episode #  1  [id number]                   [id number]                 [id number]  2  [id number]                   [id number]                 [id number] ---------------------------------------------------------------------- Indications  Obesity complicating pregnancy, third          O99.213  trimester (BMI 36)  Hypertension - Chronic/Pre-existing            O10.019  (Labetalol )  Poor obstetric history: Previous preeclampsia  O09.299  Previous cesarean delivery, antepartum         O34.219  (failed to descend)  Encounter for other antenatal screening        Z36.2  follow-up  [redacted] weeks gestation of pregnancy                Z3A.32  LR female ---------------------------------------------------------------------- Fetal Evaluation  Num Of Fetuses:         1  Fetal  Heart Rate(bpm):  144  Cardiac Activity:       Observed  Placenta:               Posterior  P. Cord Insertion:      Previously seen  Amniotic Fluid  AFI FV:      Within normal limits  AFI Sum(cm)     %Tile       Largest Pocket(cm)  15.4            55  5.1  RUQ(cm)       RLQ(cm)       LUQ(cm)        LLQ(cm)  5.1           3.6           2.61           4.09 ---------------------------------------------------------------------- Biophysical Evaluation  Amniotic F.V:   Pocket => 2 cm             F. Tone:        Observed  F. Movement:    Observed                   Score:          8/8  F. Breathing:   Observed ---------------------------------------------------------------------- Biometry  BPD:      78.7  mm     G. Age:  31w 4d         28  %    CI:        73.66   %    70 - 86                                                          FL/HC:      19.1   %    19.1 - 21.3  HC:      291.3  mm     G. Age:  32w 1d         17  %    HC/AC:      1.00        0.96 - 1.17  AC:      292.5  mm     G. Age:  33w 2d         82  %    FL/BPD:     70.8   %    71 - 87  FL:       55.7  mm     G. Age:  29w 2d        < 1  %    FL/AC:      19.0   %    20 - 24  HUM:      51.6  mm     G. Age:  30w 1d         12  %  LV:        3.5  mm  ULN:      49.8  mm     G. Age:  31w 4d         30  %  TIB:        52  mm     G. Age:  30w 6d         32  %  RAD:      44.5  mm     G. Age:  31w 4d         47  %  FIB:      49.9  mm     G. Age:  30w 3d         50  %  Est. FW:    1849  gm      4 lb 1 oz     34  % ----------------------------------------------------------------------  OB History  Blood Type:   O+  Gravidity:    7         Term:   2  Living:       2 ---------------------------------------------------------------------- Gestational Age  LMP:           32w 0d        Date:  10/25/23                  EDD:   07/31/24  U/S Today:     31w 4d                                        EDD:   08/03/24  Best:          32w 0d     Det. By:  LMP  (10/25/23)          EDD:    07/31/24 ---------------------------------------------------------------------- Anatomy  Cranium:               Previously seen        Aortic Arch:            Previously seen  Cavum:                 Previously seen        Ductal Arch:            Previously seen  Ventricles:            Appears normal         Diaphragm:              Appears normal  Choroid Plexus:        Previously seen        Stomach:                Appears normal, left                                                                        sided  Cerebellum:            Previously seen        Abdomen:                Previously seen  Posterior Fossa:       Previously seen        Abdominal Wall:         Previously seen  Face:                  Orbits and profile     Cord Vessels:           Previously seen                         previously seen  Lips:                  Previously seen        Kidneys:                Appear normal  Thoracic:  Previously seen        Bladder:                Appears normal  Heart:                 Appears normal         Spine:                  Previously seen                         (4CH, axis, and                         situs)  RVOT:                  Previously seen        Upper Extremities:      Previously seen  LVOT:                  Previously seen        Lower Extremities:      Previously seen  Other:  Fetal anatomic survey complete on prior scans. Female gender          previously seen. ---------------------------------------------------------------------- Cervix Uterus Adnexa  Cervix  Not visualized (advanced GA >24wks) ---------------------------------------------------------------------- Comments  Mettie Seaberry is currently at 32 weeks and 0 days.  She has  been followed due to maternal obesity and chronic  hypertension treated with labetalol .  She denies any problems since her last exam.  Her blood  pressure today was 121/83.  Sonographic findings  Single intrauterine pregnancy.  Fetal cardiac activity:  Observed.  Presentation: .  Interval fetal anatomy appears normal.  Fetal biometry shows the estimated fetal weight of 4 pounds  1 ounces which measures at the 34th percentile.  Amniotic fluid: Within normal limits.  MVP: 5.1 cm.  Placenta: Posterior.  BPP: 8/8.  There are limitations of prenatal ultrasound such as the  inability to detect certain abnormalities due to poor  visualization. Various factors such as fetal position,  gestational age and maternal body habitus may increase the  difficulty in visualizing the fetal anatomy.  As her blood pressures were difficult to control during her last  pregnancy late in the third trimester, she already has a repeat  C-section scheduled on July 10, 2024 (at 37 weeks).  Preeclampsia precautions were reviewed today.  We will continue to follow her with weekly fetal testing until  delivery.  She will return in 1 week for a BPP.  The patient stated that all of her questions were answered  today.  A total of 20 minutes was spent counseling and coordinating  the care for this patient.  Greater than 50% of the time was  spent in direct face-to-face contact. ----------------------------------------------------------------------                   Steffan Keys, MD Electronically Signed Final Report   06/05/2024 12:50 pm ----------------------------------------------------------------------   US  MFM OB FOLLOW UP Result Date: 06/05/2024 ----------------------------------------------------------------------  OBSTETRICS REPORT                       (Signed Final 06/05/2024 12:50 pm) ---------------------------------------------------------------------- Patient Info  ID #:       [id number]  D.O.B.:  11/11/1990 (32 yrs)(F)  Name:       Verla Kauth                       Visit Date: 06/05/2024 11:15 am ---------------------------------------------------------------------- Performed By  Attending:        Steffan Keys MD         Ref. Address:     [address]                                                              [place], [place]                                                             [address]  Performed By:     Comer Harrow       [facility]  Referred By:      [facility] ---------------------------------------------------------------------- Orders  #  Description                           Code        Ordered By  1  US  MFM FETAL BPP WO NON               76819.01    BURK SCHAIBLE     STRESS  2  US  MFM OB FOLLOW UP                   23183.98    DELORA SMALLER ----------------------------------------------------------------------  #  Order #                     Accession #                Episode #  1  [id number]  [id number]                 [id number]  2  [id number]                   [id number]                 [id number] ---------------------------------------------------------------------- Indications  Obesity complicating pregnancy, third          O99.213  trimester (BMI 36)  Hypertension - Chronic/Pre-existing            O10.019  (Labetalol )  Poor obstetric history: Previous preeclampsia  O09.299  Previous cesarean delivery, antepartum         O34.219  (failed to descend)  Encounter for other antenatal screening        Z36.2  follow-up  [redacted] weeks gestation of pregnancy                Z3A.32  LR female ---------------------------------------------------------------------- Fetal Evaluation  Num Of Fetuses:         1  Fetal Heart Rate(bpm):  144  Cardiac Activity:       Observed  Placenta:               Posterior  P. Cord Insertion:      Previously seen  Amniotic Fluid  AFI FV:      Within normal limits  AFI Sum(cm)     %Tile       Largest Pocket(cm)  15.4            55          5.1  RUQ(cm)        RLQ(cm)       LUQ(cm)        LLQ(cm)  5.1           3.6           2.61           4.09 ---------------------------------------------------------------------- Biophysical Evaluation  Amniotic F.V:   Pocket => 2 cm             F. Tone:        Observed  F. Movement:    Observed                   Score:          8/8  F. Breathing:   Observed ---------------------------------------------------------------------- Biometry  BPD:      78.7  mm     G. Age:  31w 4d         28  %    CI:        73.66   %    70 - 86                                                          FL/HC:      19.1   %    19.1 - 21.3  HC:      291.3  mm     G. Age:  32w 1d         17  %    HC/AC:      1.00        0.96 - 1.17  AC:      292.5  mm  G. Age:  33w 2d         82  %    FL/BPD:     70.8   %    71 - 87  FL:       55.7  mm     G. Age:  29w 2d        < 1  %    FL/AC:      19.0   %    20 - 24  HUM:      51.6  mm     G. Age:  30w 1d         12  %  LV:        3.5  mm  ULN:      49.8  mm     G. Age:  31w 4d         30  %  TIB:        52  mm     G. Age:  30w 6d         32  %  RAD:      44.5  mm     G. Age:  31w 4d         47  %  FIB:      49.9  mm     G. Age:  30w 3d         50  %  Est. FW:    1849  gm      4 lb 1 oz     34  % ---------------------------------------------------------------------- OB History  Blood Type:   O+  Gravidity:    7         Term:   2  Living:       2 ---------------------------------------------------------------------- Gestational Age  LMP:           32w 0d        Date:  10/25/23                  EDD:   07/31/24  U/S Today:     31w 4d                                        EDD:   08/03/24  Best:          32w 0d     Det. By:  LMP  (10/25/23)          EDD:   07/31/24 ---------------------------------------------------------------------- Anatomy  Cranium:               Previously seen        Aortic Arch:            Previously seen  Cavum:                 Previously seen        Ductal Arch:            Previously seen  Ventricles:             Appears normal         Diaphragm:              Appears normal  Choroid Plexus:        Previously seen        Stomach:  Appears normal, left                                                                        sided  Cerebellum:            Previously seen        Abdomen:                Previously seen  Posterior Fossa:       Previously seen        Abdominal Wall:         Previously seen  Face:                  Orbits and profile     Cord Vessels:           Previously seen                         previously seen  Lips:                  Previously seen        Kidneys:                Appear normal  Thoracic:              Previously seen        Bladder:                Appears normal  Heart:                 Appears normal         Spine:                  Previously seen                         (4CH, axis, and                         situs)  RVOT:                  Previously seen        Upper Extremities:      Previously seen  LVOT:                  Previously seen        Lower Extremities:      Previously seen  Other:  Fetal anatomic survey complete on prior scans. Female gender          previously seen. ---------------------------------------------------------------------- Cervix Uterus Adnexa  Cervix  Not visualized (advanced GA >24wks) ---------------------------------------------------------------------- Comments  Acadia Hedgepeth is currently at 32 weeks and 0 days.  She has  been followed due to maternal obesity and chronic  hypertension treated with labetalol .  She denies any problems since her last exam.  Her blood  pressure today was 121/83.  Sonographic findings  Single intrauterine pregnancy.  Fetal cardiac activity: Observed.  Presentation: .  Interval fetal anatomy appears normal.  Fetal biometry shows the estimated fetal weight of 4 pounds  1 ounces which measures at the 34th percentile.  Amniotic fluid: Within  normal limits.  MVP: 5.1 cm.  Placenta: Posterior.  BPP: 8/8.  There are  limitations of prenatal ultrasound such as the  inability to detect certain abnormalities due to poor  visualization. Various factors such as fetal position,  gestational age and maternal body habitus may increase the  difficulty in visualizing the fetal anatomy.  As her blood pressures were difficult to control during her last  pregnancy late in the third trimester, she already has a repeat  C-section scheduled on July 10, 2024 (at 37 weeks).  Preeclampsia precautions were reviewed today.  We will continue to follow her with weekly fetal testing until  delivery.  She will return in 1 week for a BPP.  The patient stated that all of her questions were answered  today.  A total of 20 minutes was spent counseling and coordinating  the care for this patient.  Greater than 50% of the time was  spent in direct face-to-face contact. ----------------------------------------------------------------------                   Steffan Keys, MD Electronically Signed Final Report   06/05/2024 12:50 pm ----------------------------------------------------------------------    MAU Course/MDM: Orders Placed This Encounter  Procedures   CBC   Comprehensive metabolic panel   Protein / creatinine ratio, urine   Discharge patient    Meds ordered this encounter  Medications   acetaminophen -caffeine  (EXCEDRIN TENSION HEADACHE) 500-65 MG per tablet 2 tablet   lactated ringers  bolus 1,000 mL   diphenhydrAMINE  (BENADRYL ) injection 25 mg   metoCLOPramide  (REGLAN ) injection 10 mg     1420:   Evaluated patient and provided extensive counseling on the risks and management of pre eclampsia.  Pt endorsing continued HA w/o medical treatment to this point but denying other Pre E symptoms.  BP now in normal range CBC WNL w/ mild physiologic anemia CMP, UPCr pending Ordered excedrin tension. Patient declined IVF, reglan  and benadryl   1600: Patient now asymptomatic after excedrin.  BP remains WNL All labs returned  reassuring, no evidence of severe features.   NST reviewed, reactive and reassuring Pt discharged with the following return precautions: vaginal bleeding, loss of fluid, regular contractions lasting 1-2 minutes q 5-10 minutes, multiple BPs >160/110 more than 15 minutes apart, decreased fetal movement. Message sent to schedule her for BP check on Monday 10/13 at her home office in [place].   Assessment: 1. Chronic hypertension with superimposed pre-eclampsia   2. [redacted] weeks gestation of pregnancy     Plan: Discharge home w/ regular BP checks at home over the weekend and 10/13 BP check in the office.   Follow-up Information     [facility] Follow up.   Specialty: Obstetrics and Gynecology Why: For Emergencies Contact information: [address] [place]  [phone]        Center for [facility] Follow up.   Specialty: Obstetrics and Gynecology Why: BP Check appointment 10/13 Contact information: [address] [place]   [id number] [phone]               Allergies as of 06/22/2024       Reactions   Banana Swelling   Latex Swelling   Peanut-containing Drug Products Anaphylaxis   Hpv Bival (type 16,18) Recomb Vaccine  [human Papillomavirus 2-valent Recombinant Vaccine] Hives   Iodinated Contrast Media Diarrhea, Palpitations, Other (See Comments)   Pumpkin Flavoring Agent (non-screening) Swelling   Lactose Intolerance (gi) Other (See  Comments)   Gi upset        Medication List     STOP taking these medications    terconazole  0.4 % vaginal cream Commonly known as: TERAZOL 7        TAKE these medications    Acetaminophen  Extra Strength 500 MG Tabs Take 2 tablets (1,000 mg total) by mouth every 6 (six) hours as needed.   aspirin  EC 81 MG tablet Take 2 tablets (162 mg total) by mouth daily. Swallow whole. Start at 12-14 weeks   famotidine  20  MG tablet Commonly known as: PEPCID  Take 1 tablet (20 mg total) by mouth 2 (two) times daily.   FLUoxetine  20 MG capsule Commonly known as: PROzac  Take 1 capsule (20 mg total) by mouth daily.   labetalol  100 MG tablet Commonly known as: NORMODYNE  Take 1 tablet (100 mg total) by mouth 2 (two) times daily. What changed: additional instructions   PRENATAL 1 + IRON PO       Leafy Scriver, DO 06/22/2024 4:18 PM   Attestation of Supervision of Student:  I confirm that I have verified the information documented in the Resident Physician's  note and that I have also personally reperformed the history, physical exam and all medical decision making activities.  I have verified that all services and findings are accurately documented in this student's note; and I agree with management and plan as outlined in the documentation. I have also made any necessary editorial changes.  I was personally present and confirmed the HPI, physical exam, interpreted the labs and NST and agree with the HPI as written above.   Olam DELENA Dalton, NP Center for [employer], [facility] 06/22/2024 7:24 PM

## 2024-06-22 NOTE — MAU Note (Signed)
 Susan Krause is a 33 y.o. at [redacted]w[redacted]d here in MAU reporting: told she has pre eclampsia yesterday. Today she took a shower and after the shower and her head started to feel like it was pulsating in the back. That went away but does still have and headache. Took b/p at home and it was 167/107.   LMP:  Onset of complaint: yesterday Pain score: 7  Vitals:   06/22/24 1343  BP: (!) 141/92  Pulse: (!) 104  Resp: 18  Temp: 98.4 F (36.9 C)     FHT: 145   Lab orders placed from triage: urine PCR

## 2024-06-22 NOTE — Telephone Encounter (Signed)
 RN received babyscripts notification of elevated BP x 4. Pt reported pounding headache, back of head, felt like could feel heart beat. Pt reported just had shower, not currently doing anything. Pt denied any blurred vision. Pt reported good fetal movement, no bleeding or leaking. Pt took BP at home while on phone with RN=167/107. RN advised patient to be evaluated be evaluated at MAU. Pt verbalized understanding and reported could be there within 45 minutes to an hour. MAU notified.   Silvano LELON Piano, RN

## 2024-06-26 ENCOUNTER — Encounter (HOSPITAL_COMMUNITY): Payer: Self-pay

## 2024-06-26 NOTE — Patient Instructions (Addendum)
 Ciria Bernardini  06/26/2024   Your procedure is scheduled on:  07/10/2024  Arrive at 1015 at Entrance C on [facility] at [facility]  and [employer]. You are invited to use the FREE valet parking or use the Visitor's parking deck.  Pick up the phone at the desk and dial [phone].  Call this number if you have problems the morning of surgery: [phone]  Remember:   Do not eat food:(After Midnight) Desps de medianoche.  You may drink clear liquids until  ___0815__.  Clear liquids means a liquid you can see thru.  It can have color such as Cola or Kool aid.  Tea is OK and coffee as long as no milk or creamer of any kind.  Take these medicines the morning of surgery with A SIP OF WATER :  Labetalol  as prescribed   Do not wear jewelry, make-up or nail polish.  Do not wear lotions, powders, or perfumes. Do not wear deodorant.  Do not shave 48 hours prior to surgery.  Do not bring valuables to the hospital.  [facility] is not   responsible for any belongings or valuables brought to the hospital.  Contacts, dentures or bridgework may not be worn into surgery.  Leave suitcase in the car. After surgery it may be brought to your room.  For patients admitted to the hospital, checkout time is 11:00 AM the day of              discharge.      Please read over the following fact sheets that you were given:     Preparing for Surgery

## 2024-06-27 ENCOUNTER — Telehealth: Payer: Self-pay

## 2024-06-27 NOTE — Telephone Encounter (Signed)
 Babyscript notification of elevated BP with no symptoms, 151/106. RN called patient. Pt reported waited 15 minutes, checked other arm with second reading was 128/88, no symptoms, no headache or blurred vision, no right upper quadrant pain. Pt reported good fetal movement and denied any symptoms of preterm labor. RN advised to continue to monitor blood pressure and symptoms. Pt has scheduled appointment in office tomorrow for NST. RN reviewed when to notify provider and or go to MAU. Pt verbalized understanding.  Silvano LELON Piano, RN

## 2024-06-28 ENCOUNTER — Ambulatory Visit

## 2024-06-28 ENCOUNTER — Ambulatory Visit (INDEPENDENT_AMBULATORY_CARE_PROVIDER_SITE_OTHER)

## 2024-06-28 VITALS — BP 118/81 | HR 91 | Wt 241.0 lb

## 2024-06-28 DIAGNOSIS — O10919 Unspecified pre-existing hypertension complicating pregnancy, unspecified trimester: Secondary | ICD-10-CM

## 2024-06-28 DIAGNOSIS — O0993 Supervision of high risk pregnancy, unspecified, third trimester: Secondary | ICD-10-CM

## 2024-06-28 DIAGNOSIS — O10913 Unspecified pre-existing hypertension complicating pregnancy, third trimester: Secondary | ICD-10-CM | POA: Diagnosis not present

## 2024-06-28 DIAGNOSIS — Z3A35 35 weeks gestation of pregnancy: Secondary | ICD-10-CM

## 2024-06-28 DIAGNOSIS — O119 Pre-existing hypertension with pre-eclampsia, unspecified trimester: Secondary | ICD-10-CM

## 2024-06-28 NOTE — Progress Notes (Signed)
 Pt here for NST. Pt is 35 weeks 2 days. Pt reported good fetal movement with no symptoms of preterm labor. VS WNL. NST reviewed by Dr. Erik, NST reactive and reassuring for gestational age. Follow up as scheduled. Reviewed when to notify provider and or go to MAU.    Silvano LELON Piano, RN

## 2024-07-03 ENCOUNTER — Ambulatory Visit (INDEPENDENT_AMBULATORY_CARE_PROVIDER_SITE_OTHER): Admitting: Obstetrics and Gynecology

## 2024-07-03 ENCOUNTER — Other Ambulatory Visit (HOSPITAL_COMMUNITY)
Admission: RE | Admit: 2024-07-03 | Discharge: 2024-07-03 | Disposition: A | Discharge status: Home / Self Care | Source: Ambulatory Visit

## 2024-07-03 VITALS — BP 130/80 | HR 98 | Wt 243.0 lb

## 2024-07-03 DIAGNOSIS — R350 Frequency of micturition: Secondary | ICD-10-CM

## 2024-07-03 DIAGNOSIS — O0993 Supervision of high risk pregnancy, unspecified, third trimester: Secondary | ICD-10-CM | POA: Diagnosis not present

## 2024-07-03 DIAGNOSIS — Z3A36 36 weeks gestation of pregnancy: Secondary | ICD-10-CM
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
## 2024-07-04 ENCOUNTER — Inpatient Hospital Stay (HOSPITAL_COMMUNITY): Admitting: Anesthesiology

## 2024-07-04 ENCOUNTER — Inpatient Hospital Stay (HOSPITAL_COMMUNITY)
Admission: AD | Admit: 2024-07-04 | Discharge: 2024-07-08 | DRG: 787 | Disposition: A | Discharge status: Home / Self Care | Attending: Obstetrics and Gynecology | Admitting: Obstetrics and Gynecology

## 2024-07-04 ENCOUNTER — Telehealth: Payer: Self-pay

## 2024-07-04 ENCOUNTER — Encounter (HOSPITAL_COMMUNITY): Payer: Self-pay | Admitting: Obstetrics & Gynecology

## 2024-07-04 ENCOUNTER — Ambulatory Visit

## 2024-07-04 ENCOUNTER — Other Ambulatory Visit: Payer: Self-pay

## 2024-07-04 ENCOUNTER — Encounter (HOSPITAL_COMMUNITY)
Admission: AD | Disposition: A | Discharge status: Home / Self Care | Payer: Self-pay | Source: Home / Self Care | Attending: Obstetrics and Gynecology

## 2024-07-04 VITALS — BP 145/93 | HR 109 | Wt 242.0 lb

## 2024-07-04 DIAGNOSIS — O34211 Maternal care for low transverse scar from previous cesarean delivery: Secondary | ICD-10-CM | POA: Diagnosis present

## 2024-07-04 DIAGNOSIS — Z833 Family history of diabetes mellitus: Secondary | ICD-10-CM | POA: Diagnosis not present

## 2024-07-04 DIAGNOSIS — Z79899 Other long term (current) drug therapy: Secondary | ICD-10-CM

## 2024-07-04 DIAGNOSIS — O99354 Diseases of the nervous system complicating childbirth: Secondary | ICD-10-CM | POA: Diagnosis present

## 2024-07-04 DIAGNOSIS — O99214 Obesity complicating childbirth: Secondary | ICD-10-CM | POA: Diagnosis present

## 2024-07-04 DIAGNOSIS — O99344 Other mental disorders complicating childbirth: Secondary | ICD-10-CM | POA: Diagnosis present

## 2024-07-04 DIAGNOSIS — O1092 Unspecified pre-existing hypertension complicating childbirth: Secondary | ICD-10-CM | POA: Diagnosis present

## 2024-07-04 DIAGNOSIS — F419 Anxiety disorder, unspecified: Secondary | ICD-10-CM | POA: Diagnosis present

## 2024-07-04 DIAGNOSIS — O9962 Diseases of the digestive system complicating childbirth: Secondary | ICD-10-CM | POA: Diagnosis present

## 2024-07-04 DIAGNOSIS — Z3A36 36 weeks gestation of pregnancy: Secondary | ICD-10-CM | POA: Diagnosis not present

## 2024-07-04 DIAGNOSIS — O10913 Unspecified pre-existing hypertension complicating pregnancy, third trimester: Secondary | ICD-10-CM

## 2024-07-04 DIAGNOSIS — K219 Gastro-esophageal reflux disease without esophagitis: Secondary | ICD-10-CM | POA: Diagnosis present

## 2024-07-04 DIAGNOSIS — Z23 Encounter for immunization: Secondary | ICD-10-CM

## 2024-07-04 DIAGNOSIS — O34219 Maternal care for unspecified type scar from previous cesarean delivery: Secondary | ICD-10-CM

## 2024-07-04 DIAGNOSIS — E66813 Obesity, class 3: Secondary | ICD-10-CM | POA: Diagnosis present

## 2024-07-04 DIAGNOSIS — O119 Pre-existing hypertension with pre-eclampsia, unspecified trimester: Secondary | ICD-10-CM

## 2024-07-04 DIAGNOSIS — O1413 Severe pre-eclampsia, third trimester: Principal | ICD-10-CM | POA: Diagnosis present

## 2024-07-04 DIAGNOSIS — O10919 Unspecified pre-existing hypertension complicating pregnancy, unspecified trimester: Secondary | ICD-10-CM

## 2024-07-04 DIAGNOSIS — O0993 Supervision of high risk pregnancy, unspecified, third trimester: Secondary | ICD-10-CM

## 2024-07-04 DIAGNOSIS — O368130 Decreased fetal movements, third trimester, not applicable or unspecified: Secondary | ICD-10-CM

## 2024-07-04 DIAGNOSIS — O114 Pre-existing hypertension with pre-eclampsia, complicating childbirth: Principal | ICD-10-CM | POA: Diagnosis present

## 2024-07-04 DIAGNOSIS — G43109 Migraine with aura, not intractable, without status migrainosus: Secondary | ICD-10-CM | POA: Diagnosis present

## 2024-07-04 DIAGNOSIS — O1414 Severe pre-eclampsia complicating childbirth: Secondary | ICD-10-CM | POA: Diagnosis not present

## 2024-07-04 DIAGNOSIS — Z91040 Latex allergy status: Secondary | ICD-10-CM | POA: Diagnosis not present

## 2024-07-04 LAB — CBC WITH DIFFERENTIAL/PLATELET
Abs Immature Granulocytes: 0.08 K/uL — ABNORMAL HIGH (ref 0.00–0.07)
Basophils Absolute: 0 K/uL (ref 0.0–0.1)
Basophils Relative: 0 %
Eosinophils Absolute: 0.1 K/uL (ref 0.0–0.5)
Eosinophils Relative: 1 %
HCT: 39.2 % (ref 36.0–46.0)
Hemoglobin: 12.8 g/dL (ref 12.0–15.0)
Immature Granulocytes: 1 %
Lymphocytes Relative: 17 %
Lymphs Abs: 1.6 K/uL (ref 0.7–4.0)
MCH: 27.9 pg (ref 26.0–34.0)
MCHC: 32.7 g/dL (ref 30.0–36.0)
MCV: 85.4 fL (ref 80.0–100.0)
Monocytes Absolute: 0.6 K/uL (ref 0.1–1.0)
Monocytes Relative: 6 %
Neutro Abs: 7.3 K/uL (ref 1.7–7.7)
Neutrophils Relative %: 75 %
Platelets: 281 K/uL (ref 150–400)
RBC: 4.59 MIL/uL (ref 3.87–5.11)
RDW: 14.1 % (ref 11.5–15.5)
WBC: 9.7 K/uL (ref 4.0–10.5)
nRBC: 0 % (ref 0.0–0.2)

## 2024-07-04 LAB — TYPE AND SCREEN
ABO/RH(D): O POS
Antibody Screen: NEGATIVE

## 2024-07-04 LAB — COMPREHENSIVE METABOLIC PANEL WITH GFR
ALT: 18 U/L (ref 0–44)
AST: 25 U/L (ref 15–41)
Albumin: 2.9 g/dL — ABNORMAL LOW (ref 3.5–5.0)
Alkaline Phosphatase: 104 U/L (ref 38–126)
Anion gap: 8 (ref 5–15)
BUN: 6 mg/dL (ref 6–20)
CO2: 22 mmol/L (ref 22–32)
Calcium: 9 mg/dL (ref 8.9–10.3)
Chloride: 103 mmol/L (ref 98–111)
Creatinine, Ser: 0.5 mg/dL (ref 0.44–1.00)
GFR, Estimated: >60 mL/min (ref >60)
Glucose, Bld: 76 mg/dL (ref 70–99)
Potassium: 4 mmol/L (ref 3.5–5.1)
Sodium: 133 mmol/L — ABNORMAL LOW (ref 135–145)
Total Bilirubin: 0.6 mg/dL (ref 0.0–1.2)
Total Protein: 6.6 g/dL (ref 6.5–8.1)

## 2024-07-04 LAB — GLUCOSE, CAPILLARY: Glucose-Capillary: 86 mg/dL (ref 70–99)

## 2024-07-04 LAB — PROTEIN / CREATININE RATIO, URINE
Creatinine, Urine: 80 mg/dL
Protein Creatinine Ratio: 0.11 mg/mg{creat} (ref 0.00–0.15)
Total Protein, Urine: 9 mg/dL

## 2024-07-04 SURGERY — Surgical Case
Anesthesia: Spinal

## 2024-07-04 MED ORDER — GABAPENTIN 100 MG PO CAPS
300.0000 mg | ORAL_CAPSULE | Freq: Two times a day (BID) | ORAL | Status: DC
  Administered 2024-07-05 – 2024-07-07 (×7): 300 mg via ORAL
  Filled 2024-07-04 (×7): qty 3

## 2024-07-04 MED ORDER — BUTALBITAL-APAP-CAFFEINE 50-325-40 MG PO TABS
2.0000 | ORAL_TABLET | Freq: Once | ORAL | Status: AC
  Administered 2024-07-04: 2 via ORAL
  Filled 2024-07-04: qty 2

## 2024-07-04 MED ORDER — DIBUCAINE (PERIANAL) 1 % EX OINT: 1.0000 | TOPICAL_OINTMENT | CUTANEOUS | Status: DC | PRN

## 2024-07-04 MED ORDER — SIMETHICONE 80 MG PO CHEW
80.0000 mg | CHEWABLE_TABLET | Freq: Three times a day (TID) | ORAL | Status: DC
  Administered 2024-07-05 – 2024-07-08 (×10): 80 mg via ORAL
  Filled 2024-07-04 (×10): qty 1

## 2024-07-04 MED ORDER — SODIUM CHLORIDE 0.9 % IR SOLN
Status: DC | PRN
  Administered 2024-07-04: 1

## 2024-07-04 MED ORDER — MORPHINE SULFATE (PF) 0.5 MG/ML IJ SOLN
INTRAMUSCULAR | Status: AC
  Filled 2024-07-04: qty 10

## 2024-07-04 MED ORDER — LABETALOL HCL 100 MG PO TABS
100.0000 mg | ORAL_TABLET | Freq: Every day | ORAL | Status: DC
  Administered 2024-07-05: 100 mg via ORAL
  Filled 2024-07-04 (×2): qty 1

## 2024-07-04 MED ORDER — POTASSIUM CHLORIDE CRYS ER 20 MEQ PO TBCR
20.0000 meq | EXTENDED_RELEASE_TABLET | Freq: Every day | ORAL | Status: DC
Start: 2024-07-05 — End: 2024-07-08
  Administered 2024-07-05 – 2024-07-08 (×4): 20 meq via ORAL
  Filled 2024-07-04 (×4): qty 1

## 2024-07-04 MED ORDER — ENOXAPARIN SODIUM 60 MG/0.6ML IJ SOSY
55.0000 mg | PREFILLED_SYRINGE | INTRAMUSCULAR | Status: DC
  Administered 2024-07-05 – 2024-07-08 (×4): 55 mg via SUBCUTANEOUS
  Filled 2024-07-04 (×4): qty 0.6

## 2024-07-04 MED ORDER — OXYTOCIN-SODIUM CHLORIDE 30-0.9 UT/500ML-% IV SOLN
INTRAVENOUS | Status: DC | PRN
  Administered 2024-07-04: 300 mL via INTRAVENOUS

## 2024-07-04 MED ORDER — STERILE WATER FOR IRRIGATION IR SOLN
Status: DC | PRN
  Administered 2024-07-04: 1000 mL

## 2024-07-04 MED ORDER — ONDANSETRON HCL 4 MG/2ML IJ SOLN
INTRAMUSCULAR | Status: DC | PRN
  Administered 2024-07-04: 4 mg via INTRAVENOUS

## 2024-07-04 MED ORDER — SOD CITRATE-CITRIC ACID 500-334 MG/5ML PO SOLN
30.0000 mL | ORAL | Status: AC
  Administered 2024-07-04: 30 mL via ORAL
  Filled 2024-07-04: qty 30

## 2024-07-04 MED ORDER — MEPERIDINE HCL 25 MG/ML IJ SOLN: 6.2500 mg | INTRAMUSCULAR | Status: DC | PRN

## 2024-07-04 MED ORDER — KETOROLAC TROMETHAMINE 30 MG/ML IJ SOLN
INTRAMUSCULAR | Status: AC
  Filled 2024-07-04: qty 1

## 2024-07-04 MED ORDER — KETOROLAC TROMETHAMINE 30 MG/ML IJ SOLN: 30.0000 mg | Freq: Four times a day (QID) | INTRAMUSCULAR | Status: DC | PRN

## 2024-07-04 MED ORDER — KETOROLAC TROMETHAMINE 30 MG/ML IJ SOLN
30.0000 mg | Freq: Four times a day (QID) | INTRAMUSCULAR | Status: AC
  Administered 2024-07-05 – 2024-07-06 (×4): 30 mg via INTRAVENOUS
  Filled 2024-07-04 (×4): qty 1

## 2024-07-04 MED ORDER — AMISULPRIDE (ANTIEMETIC) 5 MG/2ML IV SOLN: 10.0000 mg | Freq: Once | INTRAVENOUS | Status: DC | PRN

## 2024-07-04 MED ORDER — SCOPOLAMINE 1 MG/3DAYS TD PT72
1.0000 | MEDICATED_PATCH | Freq: Once | TRANSDERMAL | Status: AC
  Administered 2024-07-04: 1 mg via TRANSDERMAL

## 2024-07-04 MED ORDER — BUPIVACAINE IN DEXTROSE 0.75-8.25 % IT SOLN
INTRATHECAL | Status: DC | PRN
  Administered 2024-07-04: 1.6 mL via INTRATHECAL

## 2024-07-04 MED ORDER — ONDANSETRON HCL 4 MG/2ML IJ SOLN: 4.0000 mg | Freq: Three times a day (TID) | INTRAMUSCULAR | Status: DC | PRN

## 2024-07-04 MED ORDER — DEXMEDETOMIDINE HCL IN NACL 80 MCG/20ML IV SOLN
INTRAVENOUS | Status: DC | PRN
  Administered 2024-07-04: 4 ug via INTRAVENOUS

## 2024-07-04 MED ORDER — SODIUM CHLORIDE 0.9 % IV SOLN: 12.5000 mg | INTRAVENOUS | Status: DC | PRN

## 2024-07-04 MED ORDER — PHENYLEPHRINE 80 MCG/ML (10ML) SYRINGE FOR IV PUSH (FOR BLOOD PRESSURE SUPPORT)
PREFILLED_SYRINGE | INTRAVENOUS | Status: DC | PRN
  Administered 2024-07-04: 160 ug via INTRAVENOUS
  Administered 2024-07-04 (×2): 80 ug via INTRAVENOUS

## 2024-07-04 MED ORDER — FAMOTIDINE 20 MG PO TABS
20.0000 mg | ORAL_TABLET | Freq: Two times a day (BID) | ORAL | Status: DC
  Administered 2024-07-05: 20 mg via ORAL
  Filled 2024-07-04 (×5): qty 1

## 2024-07-04 MED ORDER — PRENATAL MULTIVITAMIN CH
1.0000 | ORAL_TABLET | Freq: Every day | ORAL | Status: DC
  Administered 2024-07-05 – 2024-07-07 (×3): 1 via ORAL
  Filled 2024-07-04 (×3): qty 1

## 2024-07-04 MED ORDER — DIPHENHYDRAMINE HCL 50 MG/ML IJ SOLN
INTRAMUSCULAR | Status: DC | PRN
  Administered 2024-07-04: 12.5 mg via INTRAVENOUS

## 2024-07-04 MED ORDER — MORPHINE SULFATE (PF) 0.5 MG/ML IJ SOLN
INTRAMUSCULAR | Status: DC | PRN
  Administered 2024-07-04: 150 ug via INTRATHECAL

## 2024-07-04 MED ORDER — DIPHENHYDRAMINE HCL 25 MG PO CAPS: 25.0000 mg | ORAL_CAPSULE | Freq: Four times a day (QID) | ORAL | Status: DC | PRN

## 2024-07-04 MED ORDER — OXYTOCIN-SODIUM CHLORIDE 30-0.9 UT/500ML-% IV SOLN
2.5000 [IU]/h | INTRAVENOUS | Status: AC
  Administered 2024-07-05: 2.5 [IU]/h via INTRAVENOUS

## 2024-07-04 MED ORDER — NALOXONE HCL 4 MG/10ML IJ SOLN: 1.0000 ug/kg/h | INTRAVENOUS | Status: DC | PRN

## 2024-07-04 MED ORDER — FLUOXETINE HCL 20 MG PO CAPS
20.0000 mg | ORAL_CAPSULE | Freq: Every day | ORAL | Status: DC
Start: 2024-07-05 — End: 2024-07-08
  Administered 2024-07-05 – 2024-07-07 (×4): 20 mg via ORAL
  Filled 2024-07-04 (×4): qty 1

## 2024-07-04 MED ORDER — LACTATED RINGERS IV SOLN: INTRAVENOUS | Status: DC | PRN

## 2024-07-04 MED ORDER — EPHEDRINE 5 MG/ML INJ
INTRAVENOUS | Status: AC
  Filled 2024-07-04: qty 5

## 2024-07-04 MED ORDER — MENTHOL 3 MG MT LOZG: 1.0000 | LOZENGE | OROMUCOSAL | Status: DC | PRN

## 2024-07-04 MED ORDER — OXYCODONE HCL 5 MG PO TABS: 5.0000 mg | ORAL_TABLET | Freq: Once | ORAL | Status: DC | PRN

## 2024-07-04 MED ORDER — PHENYLEPHRINE HCL-NACL 20-0.9 MG/250ML-% IV SOLN
INTRAVENOUS | Status: DC | PRN
  Administered 2024-07-04: 60 ug/min via INTRAVENOUS

## 2024-07-04 MED ORDER — COCONUT OIL OIL: 1.0000 | TOPICAL_OIL | Status: DC | PRN

## 2024-07-04 MED ORDER — WITCH HAZEL-GLYCERIN EX PADS: 1.0000 | MEDICATED_PAD | CUTANEOUS | Status: DC | PRN

## 2024-07-04 MED ORDER — KETOROLAC TROMETHAMINE 30 MG/ML IJ SOLN
INTRAMUSCULAR | Status: DC | PRN
  Administered 2024-07-04: 30 mg via INTRAVENOUS

## 2024-07-04 MED ORDER — TRANEXAMIC ACID-NACL 1000-0.7 MG/100ML-% IV SOLN
1000.0000 mg | Freq: Once | INTRAVENOUS | Status: AC
  Administered 2024-07-04: 1000 mg via INTRAVENOUS
  Filled 2024-07-04: qty 100

## 2024-07-04 MED ORDER — FUROSEMIDE 20 MG PO TABS
20.0000 mg | ORAL_TABLET | Freq: Every day | ORAL | Status: DC
Start: 2024-07-05 — End: 2024-07-08
  Administered 2024-07-05 – 2024-07-08 (×4): 20 mg via ORAL
  Filled 2024-07-04 (×4): qty 1

## 2024-07-04 MED ORDER — IBUPROFEN 600 MG PO TABS
600.0000 mg | ORAL_TABLET | Freq: Four times a day (QID) | ORAL | Status: DC
  Administered 2024-07-06 – 2024-07-08 (×9): 600 mg via ORAL
  Filled 2024-07-04 (×9): qty 1

## 2024-07-04 MED ORDER — DIPHENHYDRAMINE HCL 25 MG PO CAPS: 25.0000 mg | ORAL_CAPSULE | ORAL | Status: DC | PRN

## 2024-07-04 MED ORDER — DIPHENHYDRAMINE HCL 50 MG/ML IJ SOLN: INTRAMUSCULAR | Status: DC | PRN

## 2024-07-04 MED ORDER — SCOPOLAMINE 1 MG/3DAYS TD PT72
MEDICATED_PATCH | TRANSDERMAL | Status: AC
  Filled 2024-07-04: qty 1

## 2024-07-04 MED ORDER — ONDANSETRON 4 MG PO TBDP
8.0000 mg | ORAL_TABLET | Freq: Once | ORAL | Status: AC
  Administered 2024-07-04: 8 mg via ORAL
  Filled 2024-07-04: qty 2

## 2024-07-04 MED ORDER — FENTANYL CITRATE (PF) 100 MCG/2ML IJ SOLN: 25.0000 ug | INTRAMUSCULAR | Status: DC | PRN

## 2024-07-04 MED ORDER — LABETALOL HCL 100 MG PO TABS
100.0000 mg | ORAL_TABLET | Freq: Once | ORAL | Status: AC
  Administered 2024-07-04: 100 mg via ORAL
  Filled 2024-07-04: qty 1

## 2024-07-04 MED ORDER — SODIUM CHLORIDE 0.9% FLUSH: 3.0000 mL | INTRAVENOUS | Status: DC | PRN

## 2024-07-04 MED ORDER — FENTANYL CITRATE (PF) 100 MCG/2ML IJ SOLN
INTRAMUSCULAR | Status: AC
  Filled 2024-07-04: qty 2

## 2024-07-04 MED ORDER — OXYCODONE HCL 5 MG PO TABS
5.0000 mg | ORAL_TABLET | ORAL | Status: DC | PRN
  Administered 2024-07-05 – 2024-07-06 (×2): 5 mg via ORAL
  Filled 2024-07-04 (×2): qty 1

## 2024-07-04 MED ORDER — DEXAMETHASONE SOD PHOSPHATE PF 10 MG/ML IJ SOLN
INTRAMUSCULAR | Status: DC | PRN
  Administered 2024-07-04: 10 mg via INTRAVENOUS

## 2024-07-04 MED ORDER — CEFAZOLIN SODIUM-DEXTROSE 2-4 GM/100ML-% IV SOLN
2.0000 g | INTRAVENOUS | Status: AC
  Administered 2024-07-04: 2 g via INTRAVENOUS
  Filled 2024-07-04: qty 100

## 2024-07-04 MED ORDER — SIMETHICONE 80 MG PO CHEW
80.0000 mg | CHEWABLE_TABLET | ORAL | Status: DC | PRN
Start: 2024-07-04 — End: 2024-07-08

## 2024-07-04 MED ORDER — NALOXONE HCL 0.4 MG/ML IJ SOLN: 0.4000 mg | INTRAMUSCULAR | Status: DC | PRN

## 2024-07-04 MED ORDER — DIPHENHYDRAMINE HCL 50 MG/ML IJ SOLN: 12.5000 mg | INTRAMUSCULAR | Status: DC | PRN

## 2024-07-04 MED ORDER — SENNOSIDES-DOCUSATE SODIUM 8.6-50 MG PO TABS
2.0000 | ORAL_TABLET | Freq: Every evening | ORAL | Status: DC | PRN
  Administered 2024-07-06 – 2024-07-07 (×2): 2 via ORAL
  Filled 2024-07-04 (×2): qty 2

## 2024-07-04 MED ORDER — FENTANYL CITRATE (PF) 100 MCG/2ML IJ SOLN
INTRAMUSCULAR | Status: DC | PRN
  Administered 2024-07-04: 15 ug via INTRATHECAL

## 2024-07-04 MED ORDER — OXYCODONE HCL 5 MG/5ML PO SOLN: 5.0000 mg | Freq: Once | ORAL | Status: DC | PRN

## 2024-07-04 SURGICAL SUPPLY — 33 items
BENZOIN TINCTURE PRP APPL 2/3 (GAUZE/BANDAGES/DRESSINGS) IMPLANT
CHLORAPREP W/TINT 26 (MISCELLANEOUS) ×2 IMPLANT
CLAMP UMBILICAL CORD (MISCELLANEOUS) ×1 IMPLANT
CLOTH BEACON ORANGE TIMEOUT ST (SAFETY) ×1 IMPLANT
DRSG OPSITE POSTOP 4X10 (GAUZE/BANDAGES/DRESSINGS) ×1 IMPLANT
ELECTRODE REM PT RTRN 9FT ADLT (ELECTROSURGICAL) ×1 IMPLANT
EXTRACTOR VACUUM M CUP 4 TUBE (SUCTIONS) IMPLANT
GAUZE PAD ABD 7.5X8 STRL (GAUZE/BANDAGES/DRESSINGS) IMPLANT
GAUZE SPONGE 4X4 12PLY STRL LF (GAUZE/BANDAGES/DRESSINGS) IMPLANT
GLOVE BIO SURGEON STRL SZ7.5 (GLOVE) ×1 IMPLANT
GLOVE BIOGEL PI IND STRL 7.0 (GLOVE) ×2 IMPLANT
GLOVE BIOGEL PI IND STRL 8 (GLOVE) ×1 IMPLANT
GOWN STRL REUS W/TWL LRG LVL3 (GOWN DISPOSABLE) ×2 IMPLANT
GOWN STRL REUS W/TWL XL LVL3 (GOWN DISPOSABLE) ×1 IMPLANT
KIT ABG SYR 3ML LUER SLIP (SYRINGE) IMPLANT
MAT PREVALON FULL STRYKER (MISCELLANEOUS) IMPLANT
NDL HYPO 25X5/8 SAFETYGLIDE (NEEDLE) IMPLANT
NEEDLE HYPO 25X5/8 SAFETYGLIDE (NEEDLE) IMPLANT
NS IRRIG 1000ML POUR BTL (IV SOLUTION) ×1 IMPLANT
PACK C SECTION WH (CUSTOM PROCEDURE TRAY) ×1 IMPLANT
PAD OB MATERNITY 4.3X12.25 (PERSONAL CARE ITEMS) ×1 IMPLANT
RETRACTOR TRAXI PANNICULUS (MISCELLANEOUS) IMPLANT
RTRCTR C-SECT PINK 25CM LRG (MISCELLANEOUS) ×1 IMPLANT
STRIP CLOSURE SKIN 1/2X4 (GAUZE/BANDAGES/DRESSINGS) IMPLANT
SUT MNCRL 0 VIOLET CTX 36 (SUTURE) ×2 IMPLANT
SUT VIC AB 0 CT1 36 (SUTURE) IMPLANT
SUT VIC AB 0 CTX36XBRD ANBCTRL (SUTURE) ×1 IMPLANT
SUT VIC AB 2-0 CT1 TAPERPNT 27 (SUTURE) ×1 IMPLANT
SUT VIC AB 4-0 KS 27 (SUTURE) ×1 IMPLANT
TAPE CLOTH SURG 4X10 WHT LF (GAUZE/BANDAGES/DRESSINGS) IMPLANT
TOWEL OR 17X24 6PK STRL BLUE (TOWEL DISPOSABLE) ×1 IMPLANT
TRAY FOLEY W/BAG SLVR 14FR LF (SET/KITS/TRAYS/PACK) ×1 IMPLANT
WATER STERILE IRR 1000ML POUR (IV SOLUTION) ×1 IMPLANT

## 2024-07-04 NOTE — Discharge Summary (Signed)
 Postpartum Discharge Summary      Patient Name: Susan Krause DOB: September 22, 1990 MRN: [id number]  Date of admission: 07/04/2024 Delivery date:07/04/2024 Delivering provider: IZELL HARARI Date of discharge: 07/08/2024  Admitting diagnosis: Pregnancy at 36/1. Severe pre-eclampsia in third trimester [O14.13] based on HA, superimposed on CHTN. H/o c-sections Secondary diagnosis:  declines seizure prophylaxis  Additional problems: SIPE with severe features    Discharge diagnosis: Term Pregnancy Delivered, Preeclampsia (severe), and CHTN                                              Post partum procedures:none Augmentation: N/A Complications: declines seizure prophylaxis  Hospital course: Patient presented to MAU with two days of worsening HA. Labs normal and BPs mild range but given persistent HA, despite MAU interventions, decision made to proceed with delivery due to severe pre-eclampsia diagnosis. She had a RLTCS which was uncomplicated; see op note for details. She did decline seizure prophylaxis of mg or keppra in the MAU, during surgery and postpartum. She developed vision changes and eventually agreed to Keppra pp. She had an uncomplicated PP course. For her severe pre-eclampsia, she was kept on her labetalol  100 at bedtime and Amlodipine and started on lasix  and kdur and had repeat pre-e labs on POD#1, to ensure stability.   She is ambulating, tolerating a regular diet, passing flatus, and urinating well. Patient is discharged home in stable condition on  07/08/24        Newborn Data: Birth date:07/04/2024 Birth time:9:22 PM Gender:Female Living status:  Apgars:8 ,9  Weight:2770 g    Magnesium  Sulfate and/or Keppra received: declined BMZ received: No Rhophylac:N/A MMR:No T-DaP:Given prenatally Flu: N/A RSV Vaccine received: Yes Transfusion:No Immunizations administered: Immunization History  Administered Date(s) Administered    sv, Bivalent, Protein Subunit Rsvpref,pf  [place]) 06/19/2024   Tdap 09/13/2017, 01/25/2023, 05/22/2024    Physical exam  Vitals:   07/07/24 0513 07/07/24 1027 07/07/24 2240 07/08/24 0612  BP: 122/81 129/88 (!) 133/90 120/81  Pulse: 83 99  80  Resp: 20  18 18   Temp: 98.5 F (36.9 C)  98.5 F (36.9 C) 98.6 F (37 C)  TempSrc: Oral  Oral Oral  SpO2: 98%     Weight:      Height:       General: alert, cooperative, and no distress Lochia: appropriate Uterine Fundus: firm Incision: Healing well with no significant drainage DVT Evaluation: No evidence of DVT seen on physical exam. Labs: Lab Results  Component Value Date   WBC 18.6 (H) 07/05/2024   HGB 11.9 (L) 07/05/2024   HCT 35.3 (L) 07/05/2024   MCV 85.9 07/05/2024   PLT 256 07/05/2024      Latest Ref Rng & Units 07/05/2024    2:18 AM  CMP  Glucose 70 - 99 mg/dL 862   BUN 6 - 20 mg/dL 7   Creatinine 9.55 - 8.99 mg/dL 9.27   Sodium 864 - 854 mmol/L 134   Potassium 3.5 - 5.1 mmol/L 4.0   Chloride 98 - 111 mmol/L 102   CO2 22 - 32 mmol/L 21   Calcium  8.9 - 10.3 mg/dL 8.8   Total Protein 6.5 - 8.1 g/dL 5.9   Total Bilirubin 0.0 - 1.2 mg/dL 0.6   Alkaline Phos 38 - 126 U/L 92   AST 15 - 41 U/L 31   ALT 0 -  44 U/L 17    Edinburgh Score:    04/19/2023   11:00 AM  Edinburgh Postnatal Depression Scale Screening Tool  I have been able to laugh and see the funny side of things. 0   I have looked forward with enjoyment to things. 0   I have blamed myself unnecessarily when things went wrong. 2   I have been anxious or worried for no good reason. 2   I have felt scared or panicky for no good reason. 0   Things have been getting on top of me. 1   I have been so unhappy that I have had difficulty sleeping. 0   I have felt sad or miserable. 0   I have been so unhappy that I have been crying. 0   The thought of harming myself has occurred to me. 0   Edinburgh Postnatal Depression Scale Total 5      Data saved with a previous flowsheet row definition       After visit meds:  Allergies as of 07/08/2024       Reactions   Banana Swelling   Latex Swelling   Peanut-containing Drug Products Anaphylaxis   Hpv Bival (type 16,18) Recomb Vaccine  [human Papillomavirus 2-valent Recombinant Vaccine] Hives   Iodinated Contrast Media Diarrhea, Palpitations, Other (See Comments)   Pumpkin Flavoring Agent (non-screening) Swelling   Lactose Intolerance (gi) Other (See Comments)   Gi upset        Medication List     TAKE these medications    Acetaminophen  Extra Strength 500 MG Tabs Take 2 tablets (1,000 mg total) by mouth every 6 (six) hours as needed.   amLODipine 10 MG tablet Commonly known as: NORVASC Take 1 tablet (10 mg total) by mouth daily.   aspirin  EC 81 MG tablet Take 2 tablets (162 mg total) by mouth daily. Swallow whole. Start at 12-14 weeks   famotidine  20 MG tablet Commonly known as: PEPCID  Take 1 tablet (20 mg total) by mouth 2 (two) times daily.   FLUoxetine  20 MG capsule Commonly known as: PROzac  Take 1 capsule (20 mg total) by mouth daily.   furosemide  20 MG tablet Commonly known as: LASIX  Take 1 tablet (20 mg total) by mouth daily for 5 days.   gabapentin  300 MG capsule Commonly known as: NEURONTIN  Take 1 capsule (300 mg total) by mouth 2 (two) times daily.   ibuprofen  600 MG tablet Commonly known as: ADVIL  Take 1 tablet (600 mg total) by mouth every 6 (six) hours.   labetalol  100 MG tablet Commonly known as: NORMODYNE  Take 1 tablet (100 mg total) by mouth 2 (two) times daily. What changed: additional instructions   oxyCODONE  5 MG immediate release tablet Commonly known as: Oxy IR/ROXICODONE  Take 1-2 tablets (5-10 mg total) by mouth every 4 (four) hours as needed for moderate pain (pain score 4-6).   potassium chloride SA 20 MEQ tablet Commonly known as: KLOR-CON M Take 1 tablet (20 mEq total) by mouth daily for 5 days.   PRENATAL 1 + IRON PO         Discharge home in stable  condition Infant Feeding: Breast Infant Disposition:home with mother Discharge instruction: per After Visit Summary and Postpartum booklet. Activity: Advance as tolerated. Pelvic rest for 6 weeks.  Diet: routine diet Anticipated Birth Control: NFP Postpartum Appointment:2-3 days Additional Postpartum F/U: Incision check 2-3 days and BP check 2-3 days Future Appointments: No future appointments.  Follow up Visit:  Follow-up Information  [facility] for [facility] at [facility] Follow up in 3 day(s).   Specialty: Obstetrics and Gynecology Why: postop check, blood pressure check, they will call you with an appointment Contact information: [address] [place]  [address] [phone]                  [X]  1wk bp and incision check request sent to [facility] on 10/22  07/08/2024 Glenys GORMAN Birk, MD

## 2024-07-04 NOTE — Anesthesia Preprocedure Evaluation (Addendum)
 Anesthesia Evaluation  Patient identified by MRN, date of birth, ID band Patient awake    Reviewed: Allergy & Precautions, NPO status , Patient's Chart, lab work & pertinent test results, reviewed documented beta blocker date and time   History of Anesthesia Complications Negative for: history of anesthetic complications  Airway Mallampati: II   Neck ROM: Full    Dental   Pulmonary neg pulmonary ROS   Pulmonary exam normal        Cardiovascular hypertension, Pt. on medications and Pt. on home beta blockers Normal cardiovascular exam      Neuro/Psych  Headaches PSYCHIATRIC DISORDERS Anxiety      OCD    GI/Hepatic Neg liver ROS,GERD  Medicated and Controlled,,  Endo/Other    Class 3 obesity  Renal/GU negative Renal ROS     Musculoskeletal negative musculoskeletal ROS (+)    Abdominal   Peds  (+) ATTENTION DEFICIT DISORDER WITHOUT HYPERACTIVITY and ADHD Hematology  Plt 281k    Anesthesia Other Findings   Reproductive/Obstetrics (+) Pregnancy  Prev c/s Pre-Eclampsia                               Anesthesia Physical Anesthesia Plan  ASA: 3  Anesthesia Plan: Spinal   Post-op Pain Management: Tylenol  PO (pre-op)* and Regional block*   Induction:   PONV Risk Score and Plan: 2 and Treatment may vary due to age or medical condition, Ondansetron  and Scopolamine  patch - Pre-op  Airway Management Planned: Natural Airway  Additional Equipment: None  Intra-op Plan:   Post-operative Plan:   Informed Consent: I have reviewed the patients History and Physical, chart, labs and discussed the procedure including the risks, benefits and alternatives for the proposed anesthesia with the patient or authorized representative who has indicated his/her understanding and acceptance.       Plan Discussed with: CRNA and Anesthesiologist  Anesthesia Plan Comments: (Labs reviewed, platelets  acceptable. Discussed risks and benefits of spinal, including spinal/epidural hematoma, infection, failed block, and PDPH. Patient expressed understanding and wished to proceed. )         Anesthesia Quick Evaluation

## 2024-07-04 NOTE — Discharge Instructions (Addendum)
 Continue to check your blood pressures twice a day Call the office for blood pressures that are consistently above 145 for the top number or 95 for the bottom number   Hypertension During Pregnancy Hypertension is also called high blood pressure. High blood pressure means that the force of your blood moving in your body is too strong. It can cause problems for you and your baby. Different types of high blood pressure can happen during pregnancy. The types are: High blood pressure before you got pregnant. This is called chronic hypertension.  This can continue during your pregnancy. Your doctor will want to keep checking your blood pressure. You may need medicine to keep your blood pressure under control while you are pregnant. You will need follow-up visits after you have your baby. High blood pressure that goes up during pregnancy when it was normal before. This is called gestational hypertension. It will usually get better after you have your baby, but your doctor will need to watch your blood pressure to make sure that it is getting better. Very high blood pressure during pregnancy. This is called preeclampsia. Very high blood pressure is an emergency that needs to be checked and treated right away. You may develop very high blood pressure after giving birth. This is called postpartum preeclampsia. This usually occurs within 48 hours after childbirth but may occur up to 6 weeks after giving birth. This is rare. How does this affect me? If you have high blood pressure during pregnancy, you have a higher chance of developing high blood pressure: As you get older. If you get pregnant again. In some cases, high blood pressure during pregnancy can cause: Stroke. Heart attack. Damage to the kidneys, lungs, or liver. Preeclampsia. Jerky movements you cannot control (convulsions or seizures). Problems with the placenta.  What can I do to lower my risk?  Keep a healthy weight. Eat a healthy  diet. Follow what your doctor tells you about treating any medical problems that you had before becoming pregnant. It is very important to go to all of your doctor visits. Your doctor will check your blood pressure and make sure that your pregnancy is progressing as it should. Treatment should start early if a problem is found.  Follow these instructions at home:  Take your blood pressure 1-2 times per day. Call the office if your blood pressure is 155 or higher for the top number or 105 or higher for the bottom number.    Eating and drinking  Drink enough fluid to keep your pee (urine) pale yellow. Avoid caffeine . Lifestyle Do not use any products that contain nicotine or tobacco, such as cigarettes, e-cigarettes, and chewing tobacco. If you need help quitting, ask your doctor. Do not use alcohol or drugs. Avoid stress. Rest and get plenty of sleep. Regular exercise can help. Ask your doctor what kinds of exercise are best for you. General instructions Take over-the-counter and prescription medicines only as told by your doctor. Keep all prenatal and follow-up visits as told by your doctor. This is important. Contact a doctor if: You have symptoms that your doctor told you to watch for, such as: Headaches. Nausea. Vomiting. Belly (abdominal) pain. Dizziness. Light-headedness. Get help right away if: You have: Very bad belly pain that does not get better with treatment. A very bad headache that does not get better. Vomiting that does not get better. Sudden, fast weight gain. Sudden swelling in your hands, ankles, or face. Blood in your pee. Blurry vision. Double vision.  Shortness of breath. Chest pain. Weakness on one side of your body. Trouble talking. Summary High blood pressure is also called hypertension. High blood pressure means that the force of your blood moving in your body is too strong. High blood pressure can cause problems for you and your baby. Keep all  follow-up visits as told by your doctor. This is important. This information is not intended to replace advice given to you by your health care provider. Make sure you discuss any questions you have with your health care provider. Document Released: 10/02/2010 Document Revised: 12/21/2018 Document Reviewed: 09/26/2018 Elsevier Patient Education  2020 [employer]  Cesarean Delivery, Care After Refer to this sheet in the next few weeks. These instructions provide you with information on caring for yourself after your procedure. Your health care provider may also give you specific instructions. Your treatment has been planned according to current medical practices, but problems sometimes occur. Call your health care provider if you have any problems or questions after you go home. HOME CARE INSTRUCTIONS  If you have an On-Q pump, remove it on the 5th day after your surgery, by removing the dressing/bandage and pulling the pump out. Cover the site where the pump strings came out with a band-aid, as needed. Only take over-the-counter or prescription medications as directed by your health care provider. Do not drink alcohol, especially if you are breastfeeding or taking medication to relieve pain. Do not  smoke tobacco. Continue to use good perineal care. Good perineal care includes: Wiping your perineum from front to back. Keeping your perineum clean. Check your surgical cut (incision) daily for increased redness, drainage, swelling, or separation of skin. Shower and clean your incision gently with soap and water  every day, by letting warm and soapy water  run over the incision, and then pat it dry. If your health care provider says it is okay, leave the incision uncovered. Use a bandage (dressing) if the incision is draining fluid or appears irritated. If the adhesive strips across the incision do not fall off within 7 days, carefully peel them off, after a shower. Hug a pillow when coughing or  sneezing until your incision is healed. This helps to relieve pain. Do not use tampons, douches or have sexual intercourse, until your health care provider says it is okay. Wear a well-fitting bra that provides breast support. Limit wearing support panties or control-top hose. Drink enough fluids to keep your urine clear or pale yellow. Eat high-fiber foods such as whole grain cereals and breads, brown rice, beans, and fresh fruits and vegetables every day. These foods may help prevent or relieve constipation. Resume activities such as climbing stairs, driving, lifting, exercising, or traveling as directed by your health care provider. Try to have someone help you with your household activities and your newborn for at least a few days after you leave the hospital. Rest as much as possible. Try to rest or take a nap when your newborn is sleeping. Increase your activities gradually. Do not lift more than 15lbs until directed by a provider. Keep all of your scheduled postpartum appointments. It is very important to keep your scheduled follow-up appointments. At these appointments, your health care provider will be checking to make sure that you are healing physically and emotionally. SEEK MEDICAL CARE IF:  You are passing large clots from your vagina. Save any clots to show your health care provider. You have a foul smelling discharge from your vagina. You have trouble urinating. You are urinating frequently.  You have pain when you urinate. You have a change in your bowel movements. You have increasing redness, pain, or swelling near your incision. You have pus draining from your incision. Your incision is separating. You have painful, hard, or reddened breasts. You have a severe headache. You have blurred vision or see spots. You feel sad or depressed. You have thoughts of hurting yourself or your newborn. You have questions about your care, the care of your newborn, or medications. You are  dizzy or light-headed. You have a rash. You have pain, redness, or swelling at the site of the removed intravenous access (IV) tube. You have nausea or vomiting. You stopped breastfeeding and have not had a menstrual period within 12 weeks of stopping. You are not breastfeeding and have not had a menstrual period within 12 weeks of delivery. You have a fever. SEEK IMMEDIATE MEDICAL CARE IF: You have persistent pain. You have chest pain. You have shortness of breath. You faint. You have leg pain. You have stomach pain. Your vaginal bleeding saturates 2 or more sanitary pads in 1 hour. MAKE SURE YOU:  Understand these instructions. Will watch your condition. Will get help right away if you are not doing well or get worse. Document Released: 05/22/2002 Document Revised: 01/14/2014 Document Reviewed: 04/26/2012 [facility] Patient Information 2015 [place], [place]. This information is not intended to replace advice given to you by your health care provider. Make sure you discuss any questions you have with your health care provider.
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
## 2024-07-04 NOTE — MAU Provider Note (Signed)
History     CSN: [id number]  Arrival date and time: 07/04/24 1323   Event Date/Time   First Provider Initiated Contact with Patient 07/04/24 1415      Chief Complaint  Patient presents with   Headache   Hypertension   HPI  [name] is a 33 y.o. female [id number] @ [redacted]w[redacted]d here in MAU with concerns about elevated BP. Her pregnancy has been complicated by anxiety and cHTN. She is currently taking 100 mg daily of labetalol . She has declined to increase her BP medications throughout her pregnancy due to side effects.  She has a history of severe preeclampsia with previous pregnancy and underwent a stat repeat C/s @ 38 weeks. She reports a new onset HA that did not go away this morning with 2 extra strength tylenol . The HA worsens with light and has also caused nausea throughout the day. She presented [facility] office for an NST and BP check and was instructed to come to MAU for further evaluation.   OB History     Gravida  7   Para  2   Term  2   Preterm      AB  4   Living  2      SAB  2   IAB      Ectopic  2   Multiple  0   Live Births  2        Obstetric Comments  C/s for fail to descent, pushed for 3 hrs         Past Medical History:  Diagnosis Date   ADD (attention deficit disorder) 07/30/2009   Overview:   Attention-deficit Hyperactivity Disorder     ICD-10 cut over      ADHD (attention deficit hyperactivity disorder)    No meds   Cervical dysplasia    Chlamydia    Complication of anesthesia    woke up during wisdom teeth and had PVCs   Endometriosis 06/19/2020   Head injury with loss of consciousness (HCC) 03/2020   Hypertension    Hypoglycemia    Migraine    Propanolol 10 mg   Migraine with aura and without status migrainosus, not intractable 12/09/2017   Takes labetalol  daily for this, previously on propranolol      OCD (obsessive compulsive disorder)    Ovarian cyst    Pre-eclampsia    PTSD (post-traumatic stress disorder)    no meds    PVC's (premature ventricular contractions) 2018   has palpations when she has PV's.   Sinus tachycardia    UTI (urinary tract infection)     Past Surgical History:  Procedure Laterality Date   CESAREAN SECTION N/A 07/18/2018   Procedure: CESAREAN SECTION;  Surgeon: Bettina Muskrat, MD;  Location: [facility];  Service: Obstetrics;  Laterality: N/A;   CESAREAN SECTION N/A 03/07/2023   Procedure: CESAREAN SECTION;  Surgeon: Lola Donnice HERO, MD;  Location: [facility]S;  Service: Obstetrics;  Laterality: N/A;   DIAGNOSTIC LAPAROSCOPY WITH REMOVAL OF ECTOPIC PREGNANCY Left 12/20/2020   Procedure: LAPAROSCOPIC LEFT SALPINGECTOMY WITH REMOVAL OF ECTOPIC PREGNANCY;  Surgeon: Fredirick Glenys RAMAN, MD;  Location: [facility] OR;  Service: Gynecology;  Laterality: Left;   DILATION AND CURETTAGE OF UTERUS N/A 07/01/2021   Procedure: DILATATION AND CURETTAGE WITH PATHOLOGY;  Surgeon: Cleatus Moccasin, MD;  Location: [facility] OR;  Service: Gynecology;  Laterality: N/A;   SALPINGECTOMY Left 12/2020   WISDOM TOOTH EXTRACTION      Family History  Problem Relation  Age of Onset   Diabetes Mother    Cancer Mother        stage 2 breast   ADD / ADHD Mother    Anxiety disorder Mother    Depression Mother    Hyperlipidemia Mother    Obesity Mother    Depression Father    ADD / ADHD Father    Anxiety disorder Sister    Asthma Sister    Depression Sister    ADD / ADHD Sister    Intellectual disability Brother    Learning disabilities Brother    Anxiety disorder Maternal Aunt    Diabetes Maternal Aunt    Alcohol abuse Maternal Uncle    Hyperlipidemia Maternal Uncle    Anxiety disorder Paternal Uncle    Diabetes Paternal Uncle    Anxiety disorder Maternal Grandmother    Diabetes Maternal Grandmother    Hyperlipidemia Maternal Grandmother    Varicose Veins Maternal Grandmother    Cancer Maternal Grandfather    Hyperlipidemia Maternal Grandfather    Obesity Maternal Grandfather    Vision loss Maternal Grandfather     Cancer Paternal Grandmother     Social History   Tobacco Use   Smoking status: Never   Smokeless tobacco: Never  Vaping Use   Vaping status: Never Used  Substance Use Topics   Alcohol use: No   Drug use: No    Allergies:  Allergies  Allergen Reactions   Banana Swelling   Latex Swelling   Peanut-Containing Drug Products Anaphylaxis   Hpv Bival (Type 16,18) Recomb Vaccine  [Human Papillomavirus 2-Valent Recombinant Vaccine] Hives   Iodinated Contrast Media Diarrhea, Palpitations and Other (See Comments)   Pumpkin Flavoring Agent (Non-Screening) Swelling   Lactose Intolerance (Gi) Other (See Comments)    Gi upset    Medications Prior to Admission  Medication Sig Dispense Refill Last Dose/Taking   labetalol  (NORMODYNE ) 100 MG tablet Take 1 tablet (100 mg total) by mouth 2 (two) times daily. (Patient taking differently: Take 100 mg by mouth 2 (two) times daily. Taking only at night) 60 tablet 2 07/04/2024   Prenatal Multivit-Min-Fe-FA (PRENATAL 1 + IRON PO)    07/04/2024   acetaminophen  (TYLENOL ) 500 MG tablet Take 2 tablets (1,000 mg total) by mouth every 6 (six) hours as needed. (Patient not taking: Reported on 07/04/2024) 100 tablet 0    aspirin  EC 81 MG tablet Take 2 tablets (162 mg total) by mouth daily. Swallow whole. Start at 12-14 weeks (Patient not taking: Reported on 07/04/2024) 60 tablet 12    famotidine  (PEPCID ) 20 MG tablet Take 1 tablet (20 mg total) by mouth 2 (two) times daily. (Patient not taking: Reported on 07/03/2024) 180 tablet 3    FLUoxetine  (PROZAC ) 20 MG capsule Take 1 capsule (20 mg total) by mouth daily. 90 capsule 1    Results for orders placed or performed during the hospital encounter of 07/04/24 (from the past 48 hours)  CBC with Differential/Platelet     Status: Abnormal   Collection Time: 07/04/24  1:54 PM  Result Value Ref Range   WBC 9.7 4.0 - 10.5 K/uL   RBC 4.59 3.87 - 5.11 MIL/uL   Hemoglobin 12.8 12.0 - 15.0 g/dL   HCT 60.7 63.9 - 53.9  %   MCV 85.4 80.0 - 100.0 fL   MCH 27.9 26.0 - 34.0 pg   MCHC 32.7 30.0 - 36.0 g/dL   RDW 85.8 88.4 - 84.4 %   Platelets 281 150 - 400 K/uL  nRBC 0.0 0.0 - 0.2 %   Neutrophils Relative % 75 %   Neutro Abs 7.3 1.7 - 7.7 K/uL   Lymphocytes Relative 17 %   Lymphs Abs 1.6 0.7 - 4.0 K/uL   Monocytes Relative 6 %   Monocytes Absolute 0.6 0.1 - 1.0 K/uL   Eosinophils Relative 1 %   Eosinophils Absolute 0.1 0.0 - 0.5 K/uL   Basophils Relative 0 %   Basophils Absolute 0.0 0.0 - 0.1 K/uL   Immature Granulocytes 1 %   Abs Immature Granulocytes 0.08 (H) 0.00 - 0.07 K/uL    Comment: Performed at [facility], [address], [place], [place] [address]  Comprehensive metabolic panel     Status: Abnormal   Collection Time: 07/04/24  1:54 PM  Result Value Ref Range   Sodium 133 (L) 135 - 145 mmol/L   Potassium 4.0 3.5 - 5.1 mmol/L   Chloride 103 98 - 111 mmol/L   CO2 22 22 - 32 mmol/L   Glucose, Bld 76 70 - 99 mg/dL    Comment: Glucose reference range applies only to samples taken after fasting for at least 8 hours.   BUN 6 6 - 20 mg/dL   Creatinine, Ser 9.49 0.44 - 1.00 mg/dL   Calcium  9.0 8.9 - 10.3 mg/dL   Total Protein 6.6 6.5 - 8.1 g/dL   Albumin 2.9 (L) 3.5 - 5.0 g/dL   AST 25 15 - 41 U/L   ALT 18 0 - 44 U/L   Alkaline Phosphatase 104 38 - 126 U/L   Total Bilirubin 0.6 0.0 - 1.2 mg/dL   GFR, Estimated >39 >39 mL/min    Comment: (NOTE) Calculated using the CKD-EPI Creatinine Equation (2021)    Anion gap 8 5 - 15    Comment: Performed at [facility], [address] [place], [place] [address]  Protein / creatinine ratio, urine     Status: None   Collection Time: 07/04/24  2:02 PM  Result Value Ref Range   Creatinine, Urine 80 mg/dL   Total Protein, Urine 9 mg/dL    Comment: NO NORMAL RANGE ESTABLISHED FOR THIS TEST   Protein Creatinine Ratio 0.11 0.00 - 0.15 mg/mg[Cre]    Comment: Performed at [facility], [address], [place], [place] [address]      Review of Systems  Constitutional:  Negative for fever.  Eyes:  Positive for photophobia. Negative for visual disturbance.  Neurological:  Positive for headaches.   Physical Exam   Blood pressure (!) 140/70, pulse (!) 105, temperature 98 F (36.7 C), temperature source Oral, resp. rate 16, height 5' 2 (1.575 m), weight 109.9 kg, last menstrual period 10/25/2023, SpO2 97%, currently breastfeeding.  Patient Vitals for the past 24 hrs:  BP Temp Temp src Pulse Resp SpO2 Height Weight  07/04/24 1855 -- -- -- -- -- 99 % -- --  07/04/24 1845 (!) 159/92 -- -- (!) 104 -- -- -- --  07/04/24 1830 (!) 147/88 -- -- (!) 102 -- -- -- --  07/04/24 1828 139/89 -- -- 97 -- -- -- --  07/04/24 1815 (!) 147/86 -- -- 95 -- -- -- --  07/04/24 1800 (!) 147/89 -- -- 97 -- -- -- --  07/04/24 1746 (!) 143/83 -- -- (!) 103 -- -- -- --  07/04/24 1745 -- -- -- -- -- 100 % -- --  07/04/24 1740 -- -- -- -- -- 100 % -- --  07/04/24 1735 -- -- -- -- --  99 % -- --  07/04/24 1731 (!) 140/74 -- -- 91 -- -- -- --  07/04/24 1730 -- -- -- -- -- 100 % -- --  07/04/24 1725 -- -- -- -- -- 99 % -- --  07/04/24 1720 -- -- -- -- -- 98 % -- --  07/04/24 1715 91/66 -- -- 95 -- 98 % -- --  07/04/24 1710 -- -- -- -- -- 97 % -- --  07/04/24 1703 (!) 73/30 -- -- 83 -- -- -- --  07/04/24 1700 (!) 73/44 -- -- 82 -- -- -- --  07/04/24 1645 -- -- -- -- -- 98 % -- --  07/04/24 1640 -- -- -- -- -- 98 % -- --  07/04/24 1635 -- -- -- -- -- 98 % -- --  07/04/24 1631 (!) 140/70 -- -- (!) 105 -- -- -- --  07/04/24 1630 -- -- -- -- -- 97 % -- --  07/04/24 1625 -- -- -- -- -- 98 % -- --  07/04/24 1620 -- -- -- -- -- 98 % -- --  07/04/24 1615 138/83 -- -- (!) 117 -- 98 % -- --  07/04/24 1612 (!) 138/90 -- -- (!) 103 -- -- -- --  07/04/24 1610 -- -- -- -- -- 99 % -- --  07/04/24 1607 (!) 132/92 -- -- (!) 111 -- -- -- --  07/04/24 1600 (!) 170/85 -- -- (!) 111 -- 98 % -- --  07/04/24 1555 -- -- -- -- -- 98 % -- --  07/04/24 1550 -- --  -- -- -- 99 % -- --  07/04/24 1545 (!) 141/93 -- -- (!) 102 -- 98 % -- --  07/04/24 1540 -- -- -- -- -- 99 % -- --  07/04/24 1535 -- -- -- -- -- 98 % -- --  07/04/24 1530 (!) 148/95 -- -- 100 -- 100 % -- --  07/04/24 1525 -- -- -- -- -- 99 % -- --  07/04/24 1520 -- -- -- -- -- 98 % -- --  07/04/24 1516 (!) 165/104 -- -- 100 -- -- -- --  07/04/24 1515 -- -- -- -- -- 99 % -- --  07/04/24 1510 -- -- -- -- -- 99 % -- --  07/04/24 1505 -- -- -- -- -- 99 % -- --  07/04/24 1501 (!) 153/98 -- -- (!) 104 -- -- -- --  07/04/24 1500 -- -- -- -- -- 99 % -- --  07/04/24 1455 -- -- -- -- -- 98 % -- --  07/04/24 1450 -- -- -- -- -- 99 % -- --  07/04/24 1446 (!) 150/98 -- -- (!) 104 -- -- -- --  07/04/24 1445 -- -- -- -- -- 99 % -- --  07/04/24 1440 -- -- -- -- -- 99 % -- --  07/04/24 1435 -- -- -- -- -- 99 % -- --  07/04/24 1431 139/82 -- -- (!) 109 -- -- -- --  07/04/24 1430 -- -- -- -- -- 99 % -- --  07/04/24 1425 -- -- -- -- -- 98 % -- --  07/04/24 1420 -- -- -- -- -- 98 % -- --  07/04/24 1415 (!) 137/94 -- -- (!) 101 -- 99 % -- --  07/04/24 1410 -- -- -- -- -- 100 % -- --  07/04/24 1405 -- -- -- -- -- 98 % -- --  07/04/24 1400 132/86 -- -- 100 -- 98 % -- --  07/04/24 1357 (!) 142/90 98   F (36.7 C) Oral (!) 106 16 -- -- --  07/04/24 1345 -- -- -- -- -- -- 5' 2 (1.575 m) 109.9 kg    Physical Exam Constitutional:      General: She is not in acute distress.    Appearance: She is well-developed. She is not ill-appearing, toxic-appearing or diaphoretic.  Neurological:     Mental Status: She is alert and oriented to person, place, and time.     GCS: GCS eye subscore is 4. GCS verbal subscore is 5. GCS motor subscore is 6.     Deep Tendon Reflexes: Reflexes normal (Negative clonus).  Psychiatric:        Behavior: Behavior normal.    MAU Course  Procedures  MDM  PIH labs reassuring Fioricet  2 tablets given, HA unchanged. Currently 7/10 Offered IV fluids and flexeril > patient  declined Discussed patient with Dr. Lola who is agreeable with plan for repeat C/s due to severe preeclampsia. Discussed with Dr. Izell who will discuss plan of care with the patient. Bp stable in MAU.  Category 1 fetal tracing   Assessment and Plan   A:  1. Severe pre-eclampsia in third trimester   2. [redacted] weeks gestation of pregnancy      P:  Care turned over to MD- Dr. Izell.  Dorita Delon FERNS, NP 07/04/2024 9:03 PM

## 2024-07-04 NOTE — Anesthesia Procedure Notes (Signed)
 Spinal  Patient location during procedure: OR Start time: 07/04/2024 8:44 PM End time: 07/04/2024 8:47 PM Reason for block: surgical anesthesia Staffing Performed: anesthesiologist  Anesthesiologist: Lucious Debby BRAVO, MD Performed by: Lucious Debby BRAVO, MD Authorized by: Lucious Debby BRAVO, MD   Preanesthetic Checklist Completed: patient identified, IV checked, risks and benefits discussed, surgical consent, monitors and equipment checked, pre-op evaluation and timeout performed Spinal Block Patient position: sitting Prep: DuraPrep Patient monitoring: heart rate, cardiac monitor, continuous pulse ox and blood pressure Approach: midline Location: L3-4 Injection technique: single-shot Needle Needle type: Pencan  Needle gauge: 24 G Additional Notes Consent was obtained prior to the procedure with all questions answered and concerns addressed. Risks including, but not limited to, bleeding, infection, nerve damage, paralysis, failed block, inadequate analgesia, allergic reaction, high spinal, itching, and headache were discussed and the patient wished to proceed. Functioning IV was confirmed and monitors were applied. Sterile prep and drape, including hand hygiene, mask, and sterile gloves were used. The patient was positioned and the spine was prepped. The skin was anesthetized with lidocaine . Free flow of clear CSF was obtained prior to injecting local anesthetic into the CSF. The spinal needle aspirated freely following injection. The needle was carefully withdrawn. The patient tolerated the procedure well.   Debby Lucious, MD

## 2024-07-04 NOTE — MAU Note (Signed)
.  Susan Krause is a 33 y.o. at [redacted]w[redacted]d here in MAU reporting: headache since yesterday with no relief with sleep or tylenol . Was seen in the office today with elevated BP 145/95 and was instructed to be seen in MAU. +FM. Denies vaginal bleeding or LOF.   Onset of complaint: 07/03/2024 Pain score: 7/10 Vitals:   07/04/24 1357  BP: (!) 142/90  Pulse: (!) 106  Resp: 16  Temp: 98 F (36.7 C)     FHT: 125  Lab orders placed from triage: UA PCR

## 2024-07-04 NOTE — Telephone Encounter (Signed)
 RN received mychart babyscripts notification for two elevated BP's. Pt reported headache since last evening at 6 pm. Pt reported has taken two Tylenol  at 9:24 am. BP noted at home 148/101 and 143/87. RN requested patient take BP while on phone. First reading 176/95 right arm 153/96 left arm. Pt denies any blurred vision and pain under right breast. Pt reported decreased fetal movement since this morning. Pt reported last fetal movement noted 30 minutes ago. Pt reported has not kicked around much this morning. Pt denied any leaking or bleeding. RN reviewed with Dr. Erik with recommendation for patient to come to office for NST and BP check. Pt scheduled.  Silvano LELON Piano, RN

## 2024-07-04 NOTE — Op Note (Signed)
 Operative Note   SURGERY DATE: 07/04/2024  PRE-OP DIAGNOSIS:  *Pregnancy at 36/2 *Severe pre-eclampsia (HA) superimposed on chronic HTN *History of c-section x 2  POST-OP DIAGNOSIS: Same. Delivered   PROCEDURE: Repeat low transverse cesarean section via pfannenstiel skin incision with single layer uterine closure  SURGEON: Izell Harari, MD  ASSISTANT: Jomarie, MD (OB Fellow)  An experienced assistant was required given the standard of surgical care given the complexity of the case.  This assistant was needed for exposure, dissection, suctioning, retraction, instrument exchange, assisting with delivery with administration of fundal pressure, and for overall help during the procedure.  ANESTHESIA: spinal  ESTIMATED BLOOD LOSS:  DRAINS: UOP via indwelling foley, clear  TOTAL IV FLUIDS: crystalloid  VTE PROPHYLAXIS: SCDs to bilateral lower extremities  ANTIBIOTICS: Two grams of Cefazolin  were given., within 1 hour of skin incision  SPECIMENS: none  COMPLICATIONS: none  FINDINGS: No intra-abdominal adhesions were noted. Grossly normal uterus, tubes and ovaries. Clear amniotic fluid, cephalic, female infant, weight 7229hf, APGARs 8/9, intact placenta.  PROCEDURE IN DETAIL: The patient was taken to the operating room where anesthesia was administered and normal fetal heart tones were confirmed. She was then prepped and draped in the normal fashion in the dorsal supine position with a leftward tilt.  After a time out was performed, a pfannensteil skin incision was made with the scalpel and carried through to the underlying layer of fascia. The fascia was then incised at the midline and this incision was extended laterally with the mayo scissors. Attention was turned to the superior aspect of the fascial incision which was grasped with the kocher clamps x 2, tented up and the rectus muscles were dissected off with the scalpel. In a similar fashion the inferior aspect  of the fascial incision was grasped with the kocher clamps, tented up and the rectus muscles dissected off with the mayo scissors. The rectus muscles were then separated in the midline and the peritoneum was entered bluntly. The bladder blade was inserted and the vesicouterine peritoneum was identified, tented up and entered with the metzenbaum scissors. This incision was extended laterally and the bladder flap was created digitally. The bladder blade was reinserted.  A low transverse hysterotomy was made with the scalpel until the endometrial cavity was breached and the amniotic sac ruptured with the Allis clamp, yielding clear amniotic fluid. This incision was extended bluntly and the infant's head, shoulders and body were delivered atraumatically.The cord was clamped x 2 and cut, and the infant was handed to the awaiting pediatricians, after delayed cord clamping was done.  The placenta was then gradually expressed from the uterus and then the uterus was exteriorized and cleared of all clots and debris. The hysterotomy was repaired with a running suture of 1-0 monocryl to achieve excellent hemostasis.   The uterus and adnexa were then returned to the abdomen, and the hysterotomy and all operative sites were reinspected and excellent hemostasis was noted after irrigation and suction of the abdomen with warm saline.  The peritoneum was closed with a running stitch of 3-0 Vicryl. The fascia was reapproximated with 0 Vicryl in a simple running fashion bilaterally. The subcutaneous layer was then reapproximated with interrupted sutures of 2-0 plain gut, and the skin was then closed with 4-0 monocryl, in a subcuticular fashion.  The patient  tolerated the procedure well. Sponge, lap, needle, and instrument counts were correct x 2. The patient was transferred to the recovery room awake, alert and breathing independently in  stable condition.  Bebe Izell Overcast MD Attending Center for [facility]  [facility])

## 2024-07-04 NOTE — Progress Notes (Signed)
 Subjective:  Susan Krause is a 33 y.o. female here for BP check and NST. Pt reported has had headache since yesterday evening, tried Tylenol , did not help. Pt reported elevated BP's at home.  Hypertension ROS: Headache, facial flushing.  Objective:  BP (!) 145/93   Pulse (!) 118   Wt 242 lb (109.8 kg)   LMP 10/25/2023 (Exact Date)   BMI 44.26 kg/m  2nd reading unchanged 145/93 Appearance alert, and in no distress. General exam BP noted to be elevated.   Assessment:   Blood Pressure needs further observation. MD to see patient.  Plan:  Needs further evaluation.   Silvano LELON Piano, RN

## 2024-07-04 NOTE — Transfer of Care (Signed)
 Immediate Anesthesia Transfer of Care Note  Patient: [name]  Procedure(s) Performed: CESAREAN DELIVERY  Patient Location: PACU  Anesthesia Type:Spinal  Level of Consciousness: awake, alert , oriented, and patient cooperative  Airway & Oxygen Therapy: Patient Spontanous Breathing  Post-op Assessment: Report given to RN and Post -op Vital signs reviewed and stable  Post vital signs: Reviewed and stable  Last Vitals:  Vitals Value Taken Time  BP 129/77 07/04/24 22:25  Temp    Pulse 113 07/04/24 22:29  Resp 23 07/04/24 22:29  SpO2 98 % 07/04/24 22:29  Vitals shown include unfiled device data.  Last Pain:  Vitals:   07/04/24 1449  TempSrc:   PainSc: 8          Complications: No notable events documented.

## 2024-07-04 NOTE — MAU Provider Note (Addendum)
 OB Note Patient states she's had severe HA for the past two days that usually responds to apap and caffeine  but hasn't worked this time. She states she takes her labetalol  100 once a day (at bedtime) which she took last night; she had declined going up on it as an outpatient due to making her have heart palpitations  She declines any VB or LOF but can feel occasional UCs or decreased FM s/s.   Mild range BPs in MAU with labetalol  100 give at 1530 and fiorcet and fluid bolus but no help in HA. CBC, CMP and PC ratio wnl.  I d/w her re: difficulty in diagnosis of superimposed pre-e on CHTN, especially if already on meds but give her HA I told her I recommend delivery due to new diagnosis of severe pre-e. She is amenable to delivery with rpt c-section. I d/w her re: seizure prophylaxis with mg but she declines this b/c of how it made her feel last time. I d/w her re: keppra and she declines this as well. Seriousness of seizure for her and baby and increased risk PP and need for meds PP d/w her but she still declines medications for seizure ppx. I told her if she changes her mind to let us  know  Type and screen not drawn so this was ordered. NPO since 0900 NAD Ctab, normal s1 and s2, 1+ brachial reflexes Belly benign 135 baseline, +accels, no decel, mod variability Irregular UCs but appears q70m  Bebe Izell Raddle MD Attending Center for [employer] (Faculty Practice) 07/04/2024 Time: [phone]

## 2024-07-05 ENCOUNTER — Ambulatory Visit

## 2024-07-05 LAB — CBC
HCT: 35.3 % — ABNORMAL LOW (ref 36.0–46.0)
Hemoglobin: 11.9 g/dL — ABNORMAL LOW (ref 12.0–15.0)
MCH: 29 pg (ref 26.0–34.0)
MCHC: 33.7 g/dL (ref 30.0–36.0)
MCV: 85.9 fL (ref 80.0–100.0)
Platelets: 256 K/uL (ref 150–400)
RBC: 4.11 MIL/uL (ref 3.87–5.11)
RDW: 14.2 % (ref 11.5–15.5)
WBC: 18.6 K/uL — ABNORMAL HIGH (ref 4.0–10.5)
nRBC: 0 % (ref 0.0–0.2)

## 2024-07-05 LAB — COMPREHENSIVE METABOLIC PANEL WITH GFR
ALT: 17 U/L (ref 0–44)
AST: 31 U/L (ref 15–41)
Albumin: 2.6 g/dL — ABNORMAL LOW (ref 3.5–5.0)
Alkaline Phosphatase: 92 U/L (ref 38–126)
Anion gap: 11 (ref 5–15)
BUN: 7 mg/dL (ref 6–20)
CO2: 21 mmol/L — ABNORMAL LOW (ref 22–32)
Calcium: 8.8 mg/dL — ABNORMAL LOW (ref 8.9–10.3)
Chloride: 102 mmol/L (ref 98–111)
Creatinine, Ser: 0.72 mg/dL (ref 0.44–1.00)
GFR, Estimated: >60 mL/min (ref >60)
Glucose, Bld: 137 mg/dL — ABNORMAL HIGH (ref 70–99)
Potassium: 4 mmol/L (ref 3.5–5.1)
Sodium: 134 mmol/L — ABNORMAL LOW (ref 135–145)
Total Bilirubin: 0.6 mg/dL (ref 0.0–1.2)
Total Protein: 5.9 g/dL — ABNORMAL LOW (ref 6.5–8.1)

## 2024-07-05 LAB — RPR: RPR Ser Ql: NONREACTIVE

## 2024-07-05 LAB — CERVICOVAGINAL ANCILLARY ONLY
Chlamydia: NEGATIVE
Comment: NEGATIVE
Comment: NORMAL
Neisseria Gonorrhea: NEGATIVE

## 2024-07-05 LAB — URINE CULTURE, OB REFLEX: Organism ID, Bacteria: NO GROWTH

## 2024-07-05 LAB — CULTURE, OB URINE

## 2024-07-05 MED ORDER — ACETAMINOPHEN 500 MG PO TABS
1000.0000 mg | ORAL_TABLET | Freq: Four times a day (QID) | ORAL | Status: DC
  Administered 2024-07-05 – 2024-07-08 (×13): 1000 mg via ORAL
  Filled 2024-07-05 (×13): qty 2

## 2024-07-05 NOTE — Lactation Note (Signed)
 This note was copied from a baby's chart. Lactation Consultation Note  Patient Name: Susan Krause Unijb'd Date: 07/05/2024 Age:31 hours Reason for consult: Initial assessment;Late-preterm 34-36.6wks  P3. Experienced BF mom is going to call LC for next feeding to see baby latch. Mom doesn't want to give baby formula for supplementation. Mom wants to give her milk and states she is an over producer and she thinks she will have enough w/hand pump and expressing to give the baby her BM. Mom can easily express colostrum. Mom prefers to use hand pump verses DEBP because she says she can get more out that way. LPI information on how they act and their special needs like being sleepy, getting tired easily, being poor feeders,time limit on feedings to conserve energy, STS, I&O, milk storage. Praise mom for being able to get good colostrum. Mom encouraged to feed baby 8-12 times/24 hours and with feeding cues. If baby hasn't cued to feed in 3 hrs stimulate baby to feed. Mom is going to call for [facility] to come for next feeding.  Maternal Data Does the patient have breastfeeding experience prior to this delivery?: Yes How long did the patient breastfeed?: +1stc hild exclusive pump 11 months, 2nd child 15 months, just stopped BF 1 1/2 months ago to her now 88 month old.  Feeding    LATCH Score       Type of Nipple: Everted at rest and after stimulation  Comfort (Breast/Nipple): Soft / non-tender         Lactation Tools Discussed/Used Tools: Pump Breast pump type: Double-Electric Breast Pump Pump Education:  (RN set up DEBP) Reason for Pumping: LPI Pumping frequency: encouraged every 3 hours  Interventions Interventions: Hand express;DEBP;Hand pump;Education;LC Services brochure;LPT handout/interventions;Infant Driven Feeding Algorithm education  Discharge Pump:  (mom is calling her insurance to get a pump)  Consult Status Consult Status: Follow-up Date: 07/05/24 Follow-up type:  In-patient    Beryle Zeitz G 07/05/2024, 3:25 AM

## 2024-07-05 NOTE — Progress Notes (Signed)
 POSTPARTUM PROGRESS NOTE POD #1  Subjective:  Susan Krause is a 33 y.o. H2E7957 POD #1 s/p rLTCS severe preeclampsia superimposed on cHTN at [redacted]w[redacted]d. No acute events overnight. Pt reports difficulty with ambulation due to significant right lower abdominal discomfort (has been avoiding all narcotics) but is otherwise doing well. She has voided. She denies any problems with po intake. Denies nausea or vomiting. She has passed flatus. Pain is inadequately controlled, but RN talked with her and she took a dose of oxycodone .  Lochia is appropriate.  Objective: Blood pressure 120/80, pulse 85, temperature 98.3 F (36.8 C), temperature source Oral, resp. rate 17, height 5' 2 (1.575 m), weight 109.9 kg, last menstrual period 10/25/2023, SpO2 99%, currently breastfeeding.  Physical Exam:  General: alert, cooperative and no distress Heart: regular rate Resp: nonlabored Uterine Fundus: firm, at umbilicus, appropriately tender Extremities: no edema bilaterally Skin: warm, dry; incision clean/dry/intact w/ honeycomb dressing in place  Recent Labs    07/04/24 1354 07/05/24 0218  HGB 12.8 11.9*  HCT 39.2 35.3*    Assessment/Plan: Susan Krause is a 33 y.o. H2E7957 POD #1 s/p rLTCS at [redacted]w[redacted]d for severe preeclampsia superimposed on cHTN.  POD#1  - Doing well; pain moderately controlled.  - Routine postpartum care - Lovenox  for VTE prophylaxis  cHTN with superimposed pre-eclampsia with severe features - declining Mg and Keppra  - Lasix  & K+ - Most recent BP within normal limits  Anxiety - Continue prozac   Feeding: Breastfeeding Contraception: Natural Family Planning   Dispo: Plan for discharge POD#2.  LOS: 1 day   Burnard Rummer, PA-S 07/05/2024 @ 1535  GME ATTESTATION:  Evaluation and management procedures were performed by the [facility] Medicine Resident under my supervision. I was immediately available for direct supervision, assistance and direction throughout this encounter.  I also  confirm that I have verified the information documented in the resident's note, and that I have also personally reperformed the pertinent components of the physical exam and all of the medical decision making activities.  I have also made any necessary editorial changes.  Leeroy KATHEE Pouch, MD OB Fellow, Faculty Practice [facility], Center for [facility] 07/05/2024 4:24 PM

## 2024-07-05 NOTE — Lactation Note (Signed)
 This note was copied from a baby's chart. Lactation Consultation Note  Patient Name: Susan Krause Unijb'd Date: 07/05/2024 Age:31 hours Reason for consult: Follow-up assessment;Mother's request;Late-preterm 34-36.6wks  P3. LC woke baby up to feed. Baby had no interest in BF at first. [facility] suggested to mom hand express collect colostrum and spoon feed him to remind him he is hungry. Mom was doing so, LC offered to assist. LC collected 8 ml and spoon fed him in 2 spoons full. Baby getting tired. Mom wanted to see if he would latch. LC used gloved finger to get him to swallow the last bit of colostrum. So mom inserted her nipple in his mouth and he finally took it and started suckling. Baby suckled for 7 min. Before he got tired. Baby didn't burp for LC. Gave mom baby to burp or hold. Mom doesn't like the DEBP. She'd rather use hand pump and hand express. LC encouraged mom to pump w/any of the pumps for extra stimulation. Baby needs extra colostrum for stimulation and calories. And to keep his glucose up. LC kept reminded mom baby is early and they get tired faster and are very sleepy. Baby needs to be supplemented because he isn't going to BF as strong as full term baby. Mom stated she would hand express and give him what she gets. Milk storage reviewed. LPI feeding habits, STS, I&O, postioning, support, supplementation, pumping, hand expressing reviewed. Encouraged mom to call for assistance as needed.  Maternal Data Has patient been taught Hand Expression?: Yes Does the patient have breastfeeding experience prior to this delivery?: Yes How long did the patient breastfeed?: +1stc hild exclusive pump 11 months, 2nd child 15 months, just stopped BF 1 1/2 months ago to her now 38 month old.  Feeding    LATCH Score Latch: Grasps breast easily, tongue down, lips flanged, rhythmical sucking.  Audible Swallowing: A few with stimulation  Type of Nipple: Everted at rest and after  stimulation  Comfort (Breast/Nipple): Soft / non-tender  Hold (Positioning): Assistance needed to correctly position infant at breast and maintain latch.  LATCH Score: 8   Lactation Tools Discussed/Used Tools: Pump Breast pump type: Manual Pump Education:  (RN set up DEBP) Reason for Pumping: LPI  Interventions Interventions: Breast feeding basics reviewed;Assisted with latch;Skin to skin;Breast massage;Hand express;Breast compression;Adjust position;Support pillows;Position options;Expressed milk;Hand pump;Education  Discharge Pump:  (mom is calling her insurance to get a pump)  Consult Status Consult Status: Follow-up Date: 07/06/24 Follow-up type: In-patient    Nolin Grell G 07/05/2024, 6:04 AM

## 2024-07-05 NOTE — Anesthesia Postprocedure Evaluation (Signed)
 Anesthesia Post Note  Patient: Susan Krause  Procedure(s) Performed: CESAREAN DELIVERY     Patient location during evaluation: PACU Anesthesia Type: Spinal Level of consciousness: awake and alert Pain management: pain level controlled Vital Signs Assessment: post-procedure vital signs reviewed and stable Respiratory status: spontaneous breathing and respiratory function stable Cardiovascular status: blood pressure returned to baseline and stable Postop Assessment: spinal receding and no apparent nausea or vomiting Anesthetic complications: no   No notable events documented.  Last Vitals:  Vitals:   07/04/24 2315 07/04/24 2337  BP: (!) 143/84 (!) 140/82  Pulse: (!) 102 97  Resp: 15 18  Temp:  36.8 C  SpO2: 97% 99%    Last Pain:  Vitals:   07/04/24 2337  TempSrc: Oral  PainSc: 6    Pain Goal:                Epidural/Spinal Function Cutaneous sensation: Tingles (07/04/24 2337), Patient able to flex knees: Yes (07/04/24 2337), Patient able to lift hips off bed: No (07/04/24 2337), Back pain beyond tenderness at insertion site: No (07/04/24 2337), Progressively worsening motor and/or sensory loss: No (07/04/24 2337), Bowel and/or bladder incontinence post epidural: No (07/04/24 2337)  Debby FORBES Like

## 2024-07-06 LAB — STREP GP B NAA: Strep Gp B NAA: NEGATIVE

## 2024-07-06 LAB — SURGICAL PATHOLOGY

## 2024-07-06 MED ORDER — AMLODIPINE BESYLATE 5 MG PO TABS
10.0000 mg | ORAL_TABLET | Freq: Every day | ORAL | Status: DC
  Administered 2024-07-06 – 2024-07-08 (×3): 10 mg via ORAL
  Filled 2024-07-06 (×3): qty 2

## 2024-07-06 MED ORDER — LEVETIRACETAM (KEPPRA) 500 MG/5 ML ADULT IV PUSH
500.0000 mg | Freq: Two times a day (BID) | INTRAVENOUS | Status: DC
  Administered 2024-07-06 – 2024-07-07 (×2): 500 mg via INTRAVENOUS
  Filled 2024-07-06 (×4): qty 5

## 2024-07-06 NOTE — Lactation Note (Signed)
 This note was copied from a baby's chart. Lactation Consultation Note  Patient Name: Susan Krause Date: 07/06/2024 Age:31 hours Reason for consult: Difficult latch;Follow-up assessment  P3, 36 wks, @ 36 hrs of life. Mom is experienced breast feeder, Per mom big nipple, small baby mouth. Discussed steps of working on big mouth, starting with hand expression. Mom only stopped breast feeding a couple months ago. Encouraged mom the baby will eat for shorter feeds, less often- her milk is bigger then expected R/T recently breastfeeding a toddler. Discussed challenges of being 36 weeks- less stamina, more risk of jaundice. Mom has a history of OVER-SUPPLY- discussed hand expression onto spoon and feeding baby off spoon to really help baby transition through first days as well as putting to breast.  Discussed avoiding DEBP @ first and sticking with hand expression or hand pump instead to avoid over supply. Discussed use of ice @ home as needed.  Assisted with latch, started in cross-cradle, mom prefers football. Football on right breast, mom and baby working together WELL. 10 minutes of feeding, and baby falls asleep @ breast, content.  Maternal Data Has patient been taught Hand Expression?: Yes How long did the patient breastfeed?: Per mom- First baby- did not latch- pumped 11 months! Second baby- no isues- pumped and fed 4 months, then breast only- over 1 year. Mom OVER-Supplier R/T history of PUMPING  Feeding    LATCH Score Latch: Grasps breast easily, tongue down, lips flanged, rhythmical sucking.  Audible Swallowing: Spontaneous and intermittent (Baby gulping)  Type of Nipple: Everted at rest and after stimulation (Large nipple for baby)  Comfort (Breast/Nipple): Soft / non-tender  Hold (Positioning): Assistance needed to correctly position infant at breast and maintain latch.  LATCH Score: 9   Lactation Tools Discussed/Used Pump Education: Milk  Storage  Interventions Interventions: Breast feeding basics reviewed;Assisted with latch;Hand express;Breast compression;Expressed milk;Hand pump;LC Services brochure;CDC milk storage guidelines  Discharge Pump:  (Per mom- has pump from past pregnancy somewhere- shes going to talk to her primary insurance- [employer] for pump, Medicaid secondary. Discussed [employer] in house could get stork pump but it has to be through her primary insurance)  Consult Status Date: 07/07/24 Follow-up type: In-patient    [facility] 07/06/2024, 9:34 AM

## 2024-07-06 NOTE — Progress Notes (Signed)
 Called by RN about patient with worsening blurry vision and and headache.  Team had previously recommended magnesium  or Keppra and patient declined. Patient was asking RN why we are worried about seizures I presented at bedside discussed why we recommend seizure prophylaxis.  BP (!) 165/96 (BP Location: Right Arm)   Pulse 99   Temp 98.6 F (37 C) (Oral)   Resp 20   Ht 5' 2 (1.575 m)   Wt 109.9 kg   LMP 10/25/2023 (Exact Date)   SpO2 99%   BMI 44.30 kg/m   Patient Vitals for the past 24 hrs:  BP Temp Temp src Pulse Resp SpO2  07/06/24 1943 (!) 165/96 -- Oral 99 20 99 %  07/06/24 1836 (!) 140/91 -- -- -- -- --  07/06/24 1734 (!) 145/89 -- -- -- -- --  07/06/24 1437 134/83 98.6 F (37 C) Oral 88 18 98 %  07/06/24 0447 115/70 98.2 F (36.8 C) Oral 82 18 96 %   Reviewed Keppra vs Magnesium . Patient does not want to have magnesium .   Agreed to Keppra and understand other medications treat her severe range BP.  Just received norvasc  Suzen Maryan Masters, MD, MPH, ABFM, [facility] Attending Physician Center for [facility]

## 2024-07-06 NOTE — Lactation Note (Signed)
 This note was copied from a baby's chart. Lactation Consultation Note  Patient Name: Susan Krause Unijb'd Date: 07/06/2024 Age:31 hours Reason for consult: Mother's request;Follow-up assessment  P3, 36 wks, @ 41 hrs. Mom requested lactation. Hand pumped 40 ml easily. Addressed questions on breast and bottling- Dad desires to help. Discussed tips to help both mom and Dad be successful. Made into meal sized bottles x 2 and stored in fridge, discussed warming to feed.  Lactation Tools Discussed/Used Tools: Pump;Flanges Breast pump type: Manual Pump Education: Milk Storage Pumping frequency: PRN Pumped volume: 40 mL  Interventions Interventions: Breast feeding basics reviewed;Hand express;Expressed milk;Education  Discharge Pump: Manual  Consult Status Consult Status: Follow-up Date: 07/07/24 Follow-up type: In-patient    [facility] 07/06/2024, 2:24 PM

## 2024-07-06 NOTE — Progress Notes (Signed)
 POSTPARTUM PROGRESS NOTE  POD #2  Subjective:  Susan Krause is a 33 y.o. H2E7957 s/p rLTCS at [redacted]w[redacted]d.  She reports she doing well. No acute events overnight. She reports she is doing well. She denies any problems with ambulating, voiding or po intake. Denies nausea or vomiting. She has  passed flatus. Pain is moderately controlled.  Lochia is minimal. Still having weakness when walking  Objective: Blood pressure 115/70, pulse 82, temperature 98.2 F (36.8 C), temperature source Oral, resp. rate 18, height 5' 2 (1.575 m), weight 109.9 kg, last menstrual period 10/25/2023, SpO2 96%, currently breastfeeding.  Physical Exam:  General: alert, cooperative and no distress Chest: no respiratory distress Heart:regular rate, distal pulses intact Abdomen: soft, nontender,  Uterine Fundus: firm, appropriately tender DVT Evaluation: No calf swelling or tenderness Extremities: No edema Skin: warm, dry; incision clean/dry/intact w/ honeycomb dressing in place  Recent Labs    07/04/24 1354 07/05/24 0218  HGB 12.8 11.9*  HCT 39.2 35.3*    Assessment/Plan: Susan Krause is a 33 y.o. H2E7957 s/p rLTCS at [redacted]w[redacted]d for PEC w/ SF at [redacted]w[redacted]d.  POD#2  - Doing well; pain moderately controlled.  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

## 2024-07-06 NOTE — Progress Notes (Signed)
 RN called to note patient declined labetalol . Pt states she does not want to take amlodipine and labetalol , only one. BP 154/96. She has received first dose of Keppra.

## 2024-07-06 NOTE — Patient Instructions (Signed)

## 2024-07-07 ENCOUNTER — Encounter (HOSPITAL_COMMUNITY): Payer: Self-pay | Admitting: Family Medicine

## 2024-07-07 MED ORDER — LEVETIRACETAM 500 MG PO TABS
500.0000 mg | ORAL_TABLET | Freq: Two times a day (BID) | ORAL | Status: DC
  Administered 2024-07-07 – 2024-07-08 (×2): 500 mg via ORAL
  Filled 2024-07-07 (×3): qty 1
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
## 2024-07-07 NOTE — Lactation Note (Signed)
 This note was copied from a baby's chart. Lactation Consultation Note  Patient Name: Susan Krause Date: 07/07/2024 Age:31 hours Reason for consult: Follow-up assessment;Late-preterm 34-36.6wks.  P2, LPTI change in weight -7.77 to 8.81%, MOB is exclusively breastfeeding infant. Infant breastfeeding short intervals and LPTI, feedings are 5 minutes in length, MOB latched infant while LC was in the room and infant briefly breastfeed for 5 minutes, immediately afterwards infant was given 30 mls of EBM using extra slow flow bottle nipple. MOB expressing 240 mls per pumping session with hand pump. d MOB is pumping sooner if feeling fullness to prevent engorgement MOB with hx of oversupply with last delivery. MOB does not have any questions for LC at this time and following LPTI feeding guidelines. MOB will continue to breastfeed infant every 3 hours, by cues, skin to skin and afterwards supplement infant with each feeding.   Maternal Data    Feeding Mother's Current Feeding Choice: Breast Milk  LATCH Score Latch: Grasps breast easily, tongue down, lips flanged, rhythmical sucking.  Audible Swallowing: A few with stimulation  Type of Nipple: Everted at rest and after stimulation  Comfort (Breast/Nipple): Soft / non-tender  Hold (Positioning): No assistance needed to correctly position infant at breast.  LATCH Score: 9   Lactation Tools Discussed/Used Tools: Pump;Flanges Breast pump type: Manual Pump Education: Setup, frequency, and cleaning;Milk Storage Reason for Pumping: Infant is LPTI Pumping frequency: Pumping every 3 hours or sooner if breast are feeling full hx of oversupply chooses to use hand pump and not DEBP. Pumped volume: 240 mL (or more per puming session.)  Interventions Interventions: Skin to skin;Expressed milk;Education;Guidelines for Milk Supply and Pumping Schedule Handout;LPT handout/interventions;CDC milk storage guidelines;CDC Guidelines for Breast Pump  Cleaning  Discharge Pump: DEBP  Consult Status Consult Status: Follow-up Date: 07/08/24 Follow-up type: In-patient    Susan Krause 07/07/2024, 12:34 PM

## 2024-07-07 NOTE — Progress Notes (Signed)
 Per Dr. Danny oar if BP 160/110

## 2024-07-07 NOTE — Progress Notes (Signed)
 POSTPARTUM PROGRESS NOTE  POD #3  Subjective:  Susan Krause is a 33 y.o. H2E7957 s/p rLTCS at [redacted]w[redacted]d. Today she notes she is doing well. She denies any problems with ambulating, voiding or po intake. Denies nausea or vomiting. She has passed flatus, + BM.  Pain is well controlled.  Lochia minimql Denies fever/chills/chest pain/SOB.  Headache and visual changes have improved.  No RUQ pain  Objective: Blood pressure 129/88, pulse 99, temperature 98.5 F (36.9 C), temperature source Oral, resp. rate 20, height 5' 2 (1.575 m), weight 109.9 kg, last menstrual period 10/25/2023, SpO2 98%, currently breastfeeding.  Physical Exam:  General: alert, cooperative and no distress Chest: no respiratory distress Heart: regular rate and rhythm Abdomen: obese, soft, nontender, +BS Uterine Fundus: firm, appropriately tender Incision: C/D/I with honeycomb DVT Evaluation: No calf swelling or tenderness Extremities: no edema Skin: warm, dry  No results found for this or any previous visit (from the past 24 hours).  Assessment/Plan: Aracelie Addis is a 33 y.o. H2E7957 s/p rLTCS at [redacted]w[redacted]d POD#3 complicated by: 1) preeclampsia with severe features -pt declined Magnesium , agreeable to IV Keppra, now on oral Keppra -BP improved with current regimen: Labetalol  100mg  at bedtime and Norvasc 10mg  daily   2) Postop -pain well controlled -Ambulating without difficulty - Meeting milestones appropriately  Contraception: Natural family-planning Feeding: Breast-feeding  Dispo: Continue inpatient monitoring to ensure good control control of BP.  Likely plan for discharge home tomorrow   LOS: 3 days   Brayn Eckstein, DO Faculty Attending, Center for [facility] 07/07/2024, 2:12 PM

## 2024-07-08 ENCOUNTER — Other Ambulatory Visit (HOSPITAL_COMMUNITY): Payer: Self-pay

## 2024-07-08 MED ORDER — POTASSIUM CHLORIDE CRYS ER 20 MEQ PO TBCR
20.0000 meq | EXTENDED_RELEASE_TABLET | Freq: Every day | ORAL | 0 refills | Status: DC
  Filled 2024-07-08: qty 5, 5d supply, fill #0

## 2024-07-08 MED ORDER — OXYCODONE HCL 5 MG PO TABS
5.0000 mg | ORAL_TABLET | ORAL | 0 refills | Status: DC | PRN
  Filled 2024-07-08: qty 20, 2d supply, fill #0

## 2024-07-08 MED ORDER — FUROSEMIDE 20 MG PO TABS
20.0000 mg | ORAL_TABLET | Freq: Every day | ORAL | 0 refills | Status: DC
  Filled 2024-07-08: qty 5, 5d supply, fill #0

## 2024-07-08 MED ORDER — GABAPENTIN 300 MG PO CAPS
300.0000 mg | ORAL_CAPSULE | Freq: Two times a day (BID) | ORAL | 0 refills | Status: DC
  Filled 2024-07-08: qty 10, 5d supply, fill #0

## 2024-07-08 MED ORDER — IBUPROFEN 600 MG PO TABS
600.0000 mg | ORAL_TABLET | Freq: Four times a day (QID) | ORAL | 0 refills | Status: DC
  Filled 2024-07-08: qty 30, 8d supply, fill #0

## 2024-07-08 MED ORDER — AMLODIPINE BESYLATE 10 MG PO TABS
10.0000 mg | ORAL_TABLET | Freq: Every day | ORAL | 1 refills | Status: DC
  Filled 2024-07-08: qty 30, 30d supply, fill #0

## 2024-07-08 NOTE — Lactation Note (Signed)
 This note was copied from a baby's chart. Lactation Consultation Note  Patient Name: Susan Krause Date: 07/08/2024 Age:31 days,  Reason for consult: Follow-up assessment;Late-preterm 34-36.6wks;Infant weight loss;Breastfeeding assistance Baby wide awake and rooting.  LC changed a large wet and had not wet since yesterday.  LC reviewed breast feeding D/C teaching and the [facility] resources.  LC recommended since the baby is a 36 1/7 day weeker and to feed the baby at the breast 1st and then supplement 30 ml of EBM. Post pump both breast and the next feeding switch to the other breast and do the same.  LC recommended since the baby is gotten use to a the quick flow  may need to be fed a appetizer of EBM prior to latch.   Maternal Data Has patient been taught Hand Expression?: Yes Does the patient have breastfeeding experience prior to this delivery?: Yes  Feeding Mother's Current Feeding Choice: Breast Milk Nipple Type: Extra Slow Flow  LATCH Score Latch: Repeated attempts needed to sustain latch, nipple held in mouth throughout feeding, stimulation needed to elicit sucking reflex.  Audible Swallowing: Spontaneous and intermittent  Type of Nipple: Everted at rest and after stimulation  Comfort (Breast/Nipple): Soft / non-tender  Hold (Positioning): Assistance needed to correctly position infant at breast and maintain latch.  LATCH Score: 8   Lactation Tools Discussed/Used  Hand pump, and DEBP #24 F   Interventions Interventions: Breast feeding basics reviewed;Assisted with latch;Skin to skin;Breast massage;Hand express;Pre-pump if needed;Reverse pressure;Breast compression;Adjust position;Support pillows;Position options;Hand pump;DEBP;Education;Pace feeding;CDC milk storage guidelines;LC Services brochure;CDC Guidelines for Breast Pump Cleaning  Discharge Discharge Education: Engorgement and breast care;Warning signs for feeding baby Pump: DEBP;Manual;Personal  Consult  Status Consult Status: Complete Date: 07/08/24    Susan Krause 07/08/2024, 9:27 AM

## 2024-07-09 ENCOUNTER — Ambulatory Visit: Payer: Self-pay | Admitting: Obstetrics and Gynecology

## 2024-07-09 ENCOUNTER — Telehealth (HOSPITAL_COMMUNITY): Payer: Self-pay | Admitting: *Deleted

## 2024-07-09 ENCOUNTER — Other Ambulatory Visit (HOSPITAL_COMMUNITY): Payer: Self-pay

## 2024-07-09 ENCOUNTER — Encounter (HOSPITAL_COMMUNITY)
Admission: RE | Admit: 2024-07-09 | Discharge: 2024-07-09 | Disposition: A | Discharge status: Home / Self Care | Source: Ambulatory Visit | Attending: Family Medicine | Admitting: Family Medicine

## 2024-07-09 DIAGNOSIS — Z1331 Encounter for screening for depression: Secondary | ICD-10-CM

## 2024-07-09 HISTORY — DX: Unspecified pre-eclampsia, unspecified trimester: O14.90

## 2024-07-09 NOTE — Telephone Encounter (Signed)
 Patient saw CSW while in hospital. Per CSW note dated 07/07/2024, patient requested IBH referral.  Placed order for [facility] referral. Dr. Barbra notified via [facility] order.  Mliss Sieve, RN 07/09/2024 14:23

## 2024-07-10 ENCOUNTER — Ambulatory Visit

## 2024-07-10 ENCOUNTER — Inpatient Hospital Stay (HOSPITAL_COMMUNITY): Admission: RE | Admit: 2024-07-10 | Payer: Self-pay | Source: Home / Self Care | Admitting: Family Medicine

## 2024-07-11 ENCOUNTER — Ambulatory Visit (INDEPENDENT_AMBULATORY_CARE_PROVIDER_SITE_OTHER)

## 2024-07-11 DIAGNOSIS — O0990 Supervision of high risk pregnancy, unspecified, unspecified trimester: Secondary | ICD-10-CM

## 2024-07-11 DIAGNOSIS — Z4889 Encounter for other specified surgical aftercare: Secondary | ICD-10-CM

## 2024-07-11 DIAGNOSIS — Z0130 Encounter for examination of blood pressure without abnormal findings: Secondary | ICD-10-CM

## 2024-07-11 NOTE — Progress Notes (Signed)
 Subjective:  Susan Krause is a [id number] here for BP check and incision check.  She is 1 week postpartum following a repeat cesarean section. Pt reported no vaginal bleeding. Pt reported taking Amlodipine as prescribed, has not taken the Labetalol  as reported was not told to take at discharge.    Hypertension ROS: Patient has headache and visual changes, has had since delivery, has not worsened.   Pt reports incision covered with honeycomb dressing.  Objective:  LMP 10/25/2023 (Exact Date)  BP 131/92 and 127/83 Appearance alert, well appearing, and in no distress. Honeycomb and steri strips removed, incision healing well, no significant drainage, no dehiscence, no significant erythema, old dried scant amount of blood on steri strips.   Assessment:   Blood Pressure today in office stable and improved.  Incision healed nicely, no signs of infection, scar looks good.  Plan:  Current treatment plan is effective, no change in therapy. RN reviewed incision site care and when to notify provider and or go to MAU.   Silvano LELON Piano, RN

## 2024-07-16 ENCOUNTER — Encounter: Payer: Self-pay | Admitting: Obstetrics and Gynecology

## 2024-07-16 DIAGNOSIS — B372 Candidiasis of skin and nail: Secondary | ICD-10-CM

## 2024-07-17 MED ORDER — NYSTATIN 100000 UNIT/GM EX CREA: 1.0000 | TOPICAL_CREAM | Freq: Two times a day (BID) | CUTANEOUS | 1 refills | Status: DC

## 2024-07-18 ENCOUNTER — Telehealth: Payer: Self-pay

## 2024-07-18 NOTE — Telephone Encounter (Signed)
 Pt had called during the evening and left a message with after-hours while we were closed and stated that she was bleeding heavy after she had stopped bleeding post partum. I called pt at 1:30 and got info from her,and she states she had stopped bleeding about 1 week post partum and now has started back heavy bleeding with small clots and changing a pad approx. Q4 hours. No nausea, No cramping, and no fever. After consulting with Dr. Erik she ask that I let patient know this should be fine as long as their is no other symptoms and she is not having to change a pad Q1hour. If she is having any issues she can go to MAU or schedule an appt. Tried calling pt back 2x but no answer. I did leave a VM stating she could call me back.

## 2024-07-26 ENCOUNTER — Ambulatory Visit (INDEPENDENT_AMBULATORY_CARE_PROVIDER_SITE_OTHER)

## 2024-07-26 ENCOUNTER — Ambulatory Visit (INDEPENDENT_AMBULATORY_CARE_PROVIDER_SITE_OTHER): Admitting: Obstetrics and Gynecology

## 2024-07-26 ENCOUNTER — Encounter: Payer: Self-pay | Admitting: Obstetrics and Gynecology

## 2024-07-26 VITALS — BP 116/77 | HR 88 | Ht 62.0 in | Wt 215.0 lb

## 2024-07-26 DIAGNOSIS — R1030 Lower abdominal pain, unspecified: Secondary | ICD-10-CM

## 2024-07-26 DIAGNOSIS — Z4889 Encounter for other specified surgical aftercare: Secondary | ICD-10-CM

## 2024-07-26 DIAGNOSIS — B372 Candidiasis of skin and nail: Secondary | ICD-10-CM

## 2024-07-26 DIAGNOSIS — Z0130 Encounter for examination of blood pressure without abnormal findings: Secondary | ICD-10-CM

## 2024-07-26 LAB — POCT URINALYSIS DIPSTICK
Bilirubin, UA: NEGATIVE
Glucose, UA: NEGATIVE
Ketones, UA: NEGATIVE
Nitrite, UA: NEGATIVE
Protein, UA: NEGATIVE
Spec Grav, UA: 1.02 (ref 1.010–1.025)
Urobilinogen, UA: 0.2 U/dL
pH, UA: 6 (ref 5.0–8.0)

## 2024-07-26 MED ORDER — NYSTATIN 100000 UNIT/GM EX POWD: 1.0000 | Freq: Two times a day (BID) | CUTANEOUS | 1 refills | Status: AC | PRN

## 2024-07-26 MED ORDER — FLUCONAZOLE 150 MG PO TABS: 150.0000 mg | ORAL_TABLET | ORAL | 3 refills | Status: DC

## 2024-07-26 NOTE — Progress Notes (Signed)
   GYNECOLOGY OFFICE VISIT NOTE  History:  Susan Krause is a 33 y.o. [id number] here today for incision check. She showed me her incision picture yesterday which had a small amount of redness and now it is almost the full length. She has some suprapubic pain but otherwise doing well. .   Using soap for odor. Using nystatin  cream. No fevers.   The following portions of the patient's history were reviewed and updated as appropriate: allergies, current medications, past family history, past medical history, past social history, past surgical history and problem list.   Review of Systems:  Pertinent items noted in HPI and remainder of comprehensive ROS otherwise negative.  Physical Exam:  There were no vitals taken for this visit. CONSTITUTIONAL: Well-developed, well-nourished female in no acute distress.  HEENT:  Normocephalic, atraumatic. External right and left ear normal. No scleral icterus.  NECK: Normal range of motion, supple, no masses noted on observation SKIN: No rash noted. Not diaphoretic. No erythema. No pallor. MUSCULOSKELETAL: Normal range of motion. No edema noted. NEUROLOGIC: Alert and oriented to person, place, and time. Normal muscle tone coordination. No cranial nerve deficit noted. PSYCHIATRIC: Normal mood and affect. Normal behavior. Normal judgment and thought content.  ABDOMEN: No masses noted. No other overt distention noted.  Mild TTP suprapubically. Incision has erythema with satellite lesions on left edge and underside of the incision. No induration or swelling. Incision itself is closed. Does not appear with a wound infection.    Labs and Imaging No results found for this or any previous visit (from the past week). No results found.  Assessment and Plan:  1. Yeast infection of the skin (Primary) Discussed cleanser not soap Will do diflucan  and nystatin  powder to help keep area dry Discussed if not resolved by Monday, can do ABX, but at this time, does not appear  bacterial.  Will have her f/u in one week.  - nystatin  powder; Apply 1 Application topically 2 (two) times daily as needed.  Dispense: 60 g; Refill: 1 - fluconazole  (DIFLUCAN ) 150 MG tablet; Take 1 tablet (150 mg total) by mouth every 3 (three) days. For three doses  Dispense: 3 tablet; Refill: 3    Meds ordered this encounter  Medications   nystatin  powder    Sig: Apply 1 Application topically 2 (two) times daily as needed.    Dispense:  60 g    Refill:  1   fluconazole  (DIFLUCAN ) 150 MG tablet    Sig: Take 1 tablet (150 mg total) by mouth every 3 (three) days. For three doses    Dispense:  3 tablet    Refill:  3     Routine preventative health maintenance measures emphasized. Please refer to After Visit Summary for other counseling recommendations.   No follow-ups on file.  Vina Solian, MD, FACOG Obstetrician & Gynecologist, [facility] for [facility], [facility]

## 2024-07-26 NOTE — BH Specialist Note (Signed)
 Pt did not arrive to video visit and did not answer the phone; Left HIPPA-compliant message to call back Warren from [employer] for [employer] at [facility] at  [phone] [facility] office).  ?; left MyChart message for patient.  ? ?

## 2024-07-26 NOTE — Progress Notes (Signed)
 Subjective:  Susan Krause is a 33 y.o. female here for BP check and incision check. She is 3 weeks s/p C-section. Lower abdominal discomfort reported.  Hypertension ROS: taking medications as instructed, no medication side effects noted, no TIA's, no chest pain on exertion, no dyspnea on exertion, and no swelling of ankles.   Pt reports incision closed, redness around incision site, mild odor.  Objective: BP 116/77, Pulse 88 Ht 5' 2 (1.575 m)   Wt 215 lb (97.5 kg)   Breastfeeding Yes   BMI 39.32 kg/m   Appearance alert, well appearing, and in no distress. Incision closed, redness around incision site, mild odor.   Assessment:   Blood Pressure today in office well controlled and stable.  Incision MD to evaluate.  Plan:  Patient to see MD for incision check. Urine culture sent.  Silvano LELON Piano, RN

## 2024-07-28 LAB — URINE CULTURE

## 2024-07-30 ENCOUNTER — Telehealth: Payer: Self-pay | Admitting: *Deleted

## 2024-07-30 ENCOUNTER — Ambulatory Visit: Payer: Self-pay | Admitting: Clinical

## 2024-07-30 DIAGNOSIS — Z91199 Patient's noncompliance with other medical treatment and regimen due to unspecified reason: Secondary | ICD-10-CM

## 2024-07-30 NOTE — Telephone Encounter (Signed)
 Left patient a detailed message about scheduled appointment time change.

## 2024-08-01 NOTE — Progress Notes (Deleted)
   GYNECOLOGY OFFICE VISIT NOTE  History:  Susan Krause is a 33 y.o. [id number] here today for incision check. Had yeast type infection at incision last visit. Was given Diflucan  and nystatin  powder.   Plan was if not improved to let me know Monday and we would do ABX but incision improved.   ***  The following portions of the patient's history were reviewed and updated as appropriate: allergies, current medications, past family history, past medical history, past social history, past surgical history and problem list.   Review of Systems:  Pertinent items noted in HPI and remainder of comprehensive ROS otherwise negative.  Physical Exam:  There were no vitals taken for this visit. CONSTITUTIONAL: Well-developed, well-nourished female in no acute distress.  HEENT:  Normocephalic, atraumatic. External right and left ear normal. No scleral icterus.  NECK: Normal range of motion, supple, no masses noted on observation SKIN: No rash noted. Not diaphoretic. No erythema. No pallor. MUSCULOSKELETAL: Normal range of motion. No edema noted. NEUROLOGIC: Alert and oriented to person, place, and time. Normal muscle tone coordination. No cranial nerve deficit noted. PSYCHIATRIC: Normal mood and affect. Normal behavior. Normal judgment and thought content.  CARDIOVASCULAR: Normal heart rate noted RESPIRATORY: Effort and breath sounds normal, no problems with respiration noted ABDOMEN: No masses noted. No other overt distention noted.  Incision ***   Assessment and Plan:  1. Presence of surgical incision (Primary) ***    Diagnoses and all orders for this visit:  Presence of surgical incision     No orders of the defined types were placed in this encounter.    Routine preventative health maintenance measures emphasized. Please refer to After Visit Summary for other counseling recommendations.   No follow-ups on file.  Vina Solian, MD, FACOG Obstetrician & Gynecologist, [facility] for [facility], [facility]

## 2024-08-02 ENCOUNTER — Ambulatory Visit: Admitting: Obstetrics and Gynecology

## 2024-08-02 DIAGNOSIS — Z789 Other specified health status: Secondary | ICD-10-CM

## 2024-08-15 ENCOUNTER — Ambulatory Visit: Admitting: Obstetrics and Gynecology

## 2024-08-23 ENCOUNTER — Encounter: Payer: Self-pay | Admitting: Obstetrics and Gynecology

## 2024-08-23 ENCOUNTER — Ambulatory Visit: Admitting: Obstetrics and Gynecology

## 2024-08-23 DIAGNOSIS — I10 Essential (primary) hypertension: Secondary | ICD-10-CM

## 2024-08-23 DIAGNOSIS — F53 Postpartum depression: Secondary | ICD-10-CM

## 2024-08-23 MED ORDER — FLUOXETINE HCL 40 MG PO CAPS: 40.0000 mg | ORAL_CAPSULE | Freq: Every day | ORAL | 3 refills | Status: AC

## 2024-08-23 MED ORDER — AMLODIPINE BESYLATE 10 MG PO TABS: 10.0000 mg | ORAL_TABLET | Freq: Every day | ORAL | 3 refills | Status: AC

## 2024-08-23 NOTE — Progress Notes (Signed)
 Post Partum Visit Note  Susan Krause is a 33 y.o. [phone] female who presents for a postpartum visit. She is 7 weeks postpartum following a repeat cesarean section.  I have fully reviewed the prenatal and intrapartum course. The delivery was at 36 gestational weeks and 1 day  Anesthesia: spinal. Postpartum course has been good. Baby is doing well. Baby is feeding by breast/pumping Bleeding started cycle 08/18/24 mild bleeding. Bowel function is normal. Bladder function is normal. Patient is sexually active. Contraception method is natural family planning. Postpartum depression screening: positive.   Upstream - 08/23/24 1051       Pregnancy Intention Screening   Does the patient want to become pregnant in the next year? No    Does the patient's partner want to become pregnant in the next year? No    Would the patient like to discuss contraceptive options today? No      Contraception Wrap Up   Current Method FAM or LAM    End Method FAM or LAM    Contraception Counseling Provided No    How was the end contraceptive method provided? N/A         The pregnancy intention screening data noted above was reviewed. Potential methods of contraception were discussed. The patient elected to proceed with FAM or LAM.   Edinburgh Postnatal Depression Scale - 08/23/24 1032       Edinburgh Postnatal Depression Scale:  In the Past 7 Days   I have been able to laugh and see the funny side of things. 1    I have looked forward with enjoyment to things. 1    I have blamed myself unnecessarily when things went wrong. 2    I have been anxious or worried for no good reason. 2    I have felt scared or panicky for no good reason. 2    Things have been getting on top of me. 1    I have been so unhappy that I have had difficulty sleeping. 2    I have felt sad or miserable. 2    I have been so unhappy that I have been crying. 1    The thought of harming myself has occurred to me. 1    Edinburgh Postnatal  Depression Scale Total 15         Health Maintenance Due  Topic Date Due   HPV VACCINES (1 - 3-dose SCDM series) Never done   Influenza Vaccine  04/13/2024   COVID-19 Vaccine (1 - 2025-26 season) Never done    The following portions of the patient's history were reviewed and updated as appropriate: allergies, current medications, past family history, past medical history, past social history, past surgical history, and problem list.  Review of Systems Pertinent items are noted in HPI.  Objective:  BP (!) 126/90   Pulse 72   Ht 5' 2 (1.575 m)   Wt 212 lb (96.2 kg)   LMP 08/18/2024   Breastfeeding Yes   BMI 38.78 kg/m    General:  alert, cooperative, and no distress   Breasts:  not indicated  Lungs: Normal effort  Heart:  Normal rate  Abdomen: Soft non tender   Wound Well healed       Assessment:   Postpartum exam Healing well from CS standpoint  Hypertension, unspecified type BP elevated today, but stopped taking norvasc  when it ran out Rx refilled PCP follow up -     amLODipine  (NORVASC ) 10 MG tablet; Take  1 tablet (10 mg total) by mouth daily.  Postpartum depression Missed IBH appt, message sent to Qu[facility]/admin to help reschedule Discussed strategies to help with overnight sleep; has oversupply with breast milk but is fully emptying q2-3 hour overnight. Discussed trying manual pump just enough to alleviate discomfort but encourage supply to decrease a little Discussed dose increase on prozac  & she would like to move forward w/ this Virtual follow up at 2 week & 4 week mark -     FLUoxetine  (PROZAC ) 40 MG capsule; Take 1 capsule (40 mg total) by mouth daily.  Plan:   Essential components of care per ACOG recommendations:  1.  Mood and well being: Patient with positive depression screening today. Reviewed local resources for support.  - Patient tobacco use? No.   - hx of drug use? No.    2. Infant care and feeding:  -Patient currently breastmilk feeding? Yes.  Reviewed importance of draining breast regularly to support lactation.  -Social determinants of health (SDOH) reviewed in EPIC. No concerns  3. Sexuality, contraception and birth spacing - Patient does not want a pregnancy in the next year.  Desired family size is 3-4 children.  - Reviewed reproductive life planning. Reviewed contraceptive methods based on pt preferences and effectiveness.  Patient desired FAM or LAM today.   - Discussed birth spacing of 18 months  4. Sleep and fatigue -Encouraged family/partner/community support of 4 hrs of uninterrupted sleep to help with mood and fatigue  5. Physical Recovery  - Discussed patients delivery and complications. - Patient had a C-section. - Patient has urinary incontinence? No. - Patient is safe to resume physical and sexual activity  6.  Health Maintenance - HM due items addressed Yes - Last pap smear  Diagnosis  Date Value Ref Range Status  01/12/2024   Final   - Negative for intraepithelial lesion or malignancy (NILM)   Pap smear not done at today's visit.  -Breast Cancer screening indicated? No.   7. Chronic Disease/Pregnancy Condition follow up: See above  Kieth JAYSON Carolin, MD Center for [facility], [facility]

## 2024-08-28 ENCOUNTER — Encounter: Payer: Self-pay | Admitting: Licensed Clinical Social Worker

## 2024-09-05 ENCOUNTER — Encounter: Payer: Self-pay | Admitting: Obstetrics and Gynecology

## 2024-09-11 ENCOUNTER — Ambulatory Visit
Admission: RE | Admit: 2024-09-11 | Discharge: 2024-09-11 | Disposition: A | Discharge status: Home / Self Care | Source: Ambulatory Visit | Attending: Family Medicine | Admitting: Family Medicine

## 2024-09-11 VITALS — BP 134/87 | HR 119 | Temp 98.8°F | Resp 17

## 2024-09-11 DIAGNOSIS — J101 Influenza due to other identified influenza virus with other respiratory manifestations: Secondary | ICD-10-CM

## 2024-09-11 LAB — POCT INFLUENZA A/B
Influenza A, POC: POSITIVE — AB
Influenza B, POC: NEGATIVE

## 2024-09-11 MED ORDER — OSELTAMIVIR PHOSPHATE 75 MG PO CAPS: 75.0000 mg | ORAL_CAPSULE | Freq: Two times a day (BID) | ORAL | 0 refills | Status: AC

## 2024-09-11 NOTE — ED Triage Notes (Signed)
 Pt c/o cough, sore throat and RT ear pain since Sunday. Denies fever. Husband and daughter tested pos for flu on Sunday and today. Taking ibuprofen  and tylenol  prn.
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
## 2024-09-11 NOTE — Discharge Instructions (Addendum)
 Advised patient take medication as directed with food to completion.  Encouraged increase daily water  intake to 64 ounces per day while taking this medication.  Advised if symptoms worsen and are unresolved please follow-up with the PCP or here for further evaluation.

## 2024-09-12 ENCOUNTER — Encounter: Admitting: Licensed Clinical Social Worker

## 2024-09-24 ENCOUNTER — Encounter: Payer: Self-pay | Admitting: Obstetrics and Gynecology

## 2024-10-03 ENCOUNTER — Encounter: Payer: Self-pay | Admitting: Obstetrics & Gynecology

## 2024-10-03 ENCOUNTER — Other Ambulatory Visit (HOSPITAL_COMMUNITY)
Admission: RE | Admit: 2024-10-03 | Discharge: 2024-10-03 | Disposition: A | Source: Ambulatory Visit | Attending: Obstetrics & Gynecology | Admitting: Obstetrics & Gynecology

## 2024-10-03 ENCOUNTER — Ambulatory Visit (INDEPENDENT_AMBULATORY_CARE_PROVIDER_SITE_OTHER): Admitting: Obstetrics & Gynecology

## 2024-10-03 VITALS — BP 118/78 | HR 79 | Ht 62.0 in | Wt 207.0 lb

## 2024-10-03 DIAGNOSIS — N760 Acute vaginitis: Secondary | ICD-10-CM | POA: Insufficient documentation

## 2024-10-03 DIAGNOSIS — B3789 Other sites of candidiasis: Secondary | ICD-10-CM

## 2024-10-03 MED ORDER — MICONAZOLE NITRATE 2 % EX CREA: 1.0000 | TOPICAL_CREAM | Freq: Two times a day (BID) | CUTANEOUS | 1 refills | Status: AC

## 2024-10-03 MED ORDER — FLUCONAZOLE 200 MG PO TABS: 200.0000 mg | ORAL_TABLET | Freq: Every day | ORAL | 1 refills | Status: AC
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
## 2024-10-04 ENCOUNTER — Ambulatory Visit: Payer: Self-pay | Admitting: Obstetrics & Gynecology

## 2024-10-04 LAB — CERVICOVAGINAL ANCILLARY ONLY
Bacterial Vaginitis (gardnerella): NEGATIVE
Candida Glabrata: NEGATIVE
Candida Vaginitis: POSITIVE — AB
Comment: NEGATIVE
Comment: NEGATIVE
Comment: NEGATIVE
